# Patient Record
Sex: Male | Born: 1945
Health system: Southern US, Community
[De-identification: ages and names within clinical notes are randomized; demographics above are authoritative.]

## PROBLEM LIST (undated history)

## (undated) DIAGNOSIS — D696 Thrombocytopenia, unspecified: Principal | ICD-10-CM

## (undated) DIAGNOSIS — I10 Essential (primary) hypertension: Secondary | ICD-10-CM

## (undated) DIAGNOSIS — E119 Type 2 diabetes mellitus without complications: Secondary | ICD-10-CM

## (undated) HISTORY — DX: Thrombocytopenia, unspecified: D69.6

## (undated) HISTORY — PX: CHOLECYSTECTOMY: SHX55

## (undated) HISTORY — PX: HERNIA REPAIR: SHX51

---

## 2010-06-01 ENCOUNTER — Emergency Department (HOSPITAL_BASED_OUTPATIENT_CLINIC_OR_DEPARTMENT_OTHER)
Admission: EM | Admit: 2010-06-01 | Discharge: 2010-06-01 | Payer: Self-pay | Source: Home / Self Care | Admitting: Emergency Medicine

## 2010-06-09 ENCOUNTER — Emergency Department (HOSPITAL_BASED_OUTPATIENT_CLINIC_OR_DEPARTMENT_OTHER)
Admission: EM | Admit: 2010-06-09 | Discharge: 2010-06-09 | Payer: Self-pay | Source: Home / Self Care | Admitting: Emergency Medicine

## 2010-06-10 ENCOUNTER — Ambulatory Visit
Admission: RE | Admit: 2010-06-10 | Discharge: 2010-06-10 | Payer: Self-pay | Source: Home / Self Care | Attending: Family Medicine | Admitting: Family Medicine

## 2010-06-10 DIAGNOSIS — I10 Essential (primary) hypertension: Secondary | ICD-10-CM | POA: Insufficient documentation

## 2010-06-10 DIAGNOSIS — E119 Type 2 diabetes mellitus without complications: Secondary | ICD-10-CM | POA: Insufficient documentation

## 2010-06-10 DIAGNOSIS — M549 Dorsalgia, unspecified: Secondary | ICD-10-CM | POA: Insufficient documentation

## 2010-07-01 NOTE — Assessment & Plan Note (Signed)
Summary: Lumbar radiculopathy   Vital Signs:  Patient profile:   65 year old male Height:      71 inches (180.34 cm) Weight:      185.8 pounds (84.45 kg) BMI:     26.01 Temp:     98.1 degrees F (36.72 degrees C) oral Pulse rate:   87 / minute BP sitting:   154 / 100  (right arm)  Vitals Entered By: Baxter Hire) (June 10, 2010 2:40 PM) CC: left hip pain Pain Assessment Patient in pain? yes     Location: left hip Intensity: 10 Onset of pain  Left hip pain since New Years Day Nutritional Status BMI of 25 - 29 = overweight  Does patient need assistance? Functional Status Self care Ambulation Normal Comments Pain will radiate to foot. Pain is worse when walking. Came to ED and was back on 06-09-10. PCP gave Rx and patient stated that it did not help.   CC:  left hip pain.  History of Present Illness: 65 yo M here with left lateral leg pain.  Patient reports no known injury Around 05/30/10 started to have pain left buttock and left hip radiating down entire left leg into foot Associated with some numbness but mostly pain Has a dull ache in left leg as well No bowel/bladder dysfunction Went to ED - hip x-rays with minimal DJD but lumbar area showed multilevel DJD No groin pain Has tried ibuprofen ED gave him several prescriptions (has gone there twice since pain started) but has not filled them. No prior back pain or surgeries  Habits & Providers  Alcohol-Tobacco-Diet     Tobacco Status: never  Allergies (verified): No Known Drug Allergies  Family History: + DM and HTN negative for heart disease  Social History: nonsmoker, nondrinker marriedSmoking Status:  never  Physical Exam  General:  Well-developed,well-nourished,in no acute distress; alert,appropriate and cooperative throughout examination Msk:  Back: No gross deformity, swelling, bruising. Mild TTP left paraspinal and midline lumbar region.  TTP within left buttock.  No TTP over  trochanter. FROM without reproduction of pain Strength 4/5 with hip flexion on L, 5/5 on R.  5/5 all other muscle groups BLEs. 2+ MSRs in patellar and achilles tendons and equal bilaterally. Negative SLRs - feels stretch only Sensation intact to light touch bilaterally. Negative logroll bilateral hips Negative Fabers   Impression & Recommendations:  Problem # 1:  BACK PAIN (ICD-724.5) Assessment New Distribution of pain along with DJD on x-rays and weakness hip flexion consistent with lumbar radiculopathy.  Will try to treat conservatively at first.  Prednisone dose pack (advised this will increase his sugars so to continue to monitor closely) then switch to meloxicam - take these medicines with food.  Ok to fill Norco provided from ED but no driving on this.  Heating pad.  Went over other scripts from ED and advised not to fill these.  If not improving after a week will order MRI of lumbar spine and consider ESIs.  Otherwise f/u in 4 weeks if continues to improve.  His updated medication list for this problem includes:    Meloxicam 15 Mg Tabs (Meloxicam) .Marland Kitchen... 1 tab by mouth daily with food - start after finishing prednisone  Complete Medication List: 1)  Prednisone (pak) 10 Mg Tabs (Prednisone) .... Take as directed x 6 days 2)  Meloxicam 15 Mg Tabs (Meloxicam) .Marland Kitchen.. 1 tab by mouth daily with food - start after finishing prednisone 3)  Glipizide-metformin Hcl 5-500 Mg Tabs (Glipizide-metformin  hcl) 4)  Atenolol-chlorthalidone 50-25 Mg Tabs (Atenolol-chlorthalidone) 5)  Cozaar 25 Mg Tabs (Losartan potassium)  Patient Instructions: 1)  You have lumbar radiculopathy - a pinched nerve in your back causing pain throughout your left leg. 2)  Start prednisone as directed for 6 days with food. 3)  AFTER you are done with this, start meloxicam 15mg  daily with food. 4)  Use heating pad to help with pain/muscle spasms. 5)  Do NOT take the naprosyn given from the ED. 6)  You can fill the Norco  prescription (this is a pain medication). 7)  Call me in a week to let me know if things are improved.  If they have not improved enough, we will order an MRI. 8)  Otherwise follow up with me in 4 weeks for a recheck. Prescriptions: MELOXICAM 15 MG TABS (MELOXICAM) 1 tab by mouth daily with food - start AFTER finishing prednisone  #30 x 1   Entered and Authorized by:   Norton Blizzard MD   Signed by:   Norton Blizzard MD on 06/10/2010   Method used:   Print then Give to Patient   RxID:   [id number] PREDNISONE (PAK) 10 MG TABS (PREDNISONE) Take as directed x 6 days  #21 x 0   Entered and Authorized by:   Norton Blizzard MD   Signed by:   Norton Blizzard MD on 06/10/2010   Method used:   Print then Give to Patient   RxID:   [id number]    Orders Added: 1)  New Patient Level III [99203]

## 2010-07-12 ENCOUNTER — Ambulatory Visit (INDEPENDENT_AMBULATORY_CARE_PROVIDER_SITE_OTHER): Payer: MEDICARE | Admitting: Family Medicine

## 2010-07-12 ENCOUNTER — Encounter: Payer: Self-pay | Admitting: Family Medicine

## 2010-07-12 DIAGNOSIS — M549 Dorsalgia, unspecified: Secondary | ICD-10-CM

## 2010-07-21 NOTE — Assessment & Plan Note (Signed)
Summary: FU/LP/MJD   Vital Signs:  Patient profile:   65 year old male Height:      71 inches (180.34 cm) Weight:      190.8 pounds (86.73 kg) BMI:     26.71 Temp:     98.4 degrees F (36.89 degrees C) oral Pulse rate:   86 / minute BP sitting:   131 / 87  (right arm)  Vitals Entered By: Baxter Hire) (July 12, 2010 3:04 PM) CC: follow-up visit Pain Assessment Patient in pain? yes     Location: left hip Intensity: 5 Nutritional Status BMI of 25 - 29 = overweight  Does patient need assistance? Functional Status Self care Ambulation Normal   CC:  follow-up visit.  History of Present Illness: 65 yo M here for f/u left lateral leg pain, dx lumbar radiculopathy.  Patient initially seen 1 month ago for this issue. He reported no known injury Around 05/30/10 started to have pain left buttock and left hip radiating down entire left leg into foot Associated with some numbness but mostly pain Had a dull ache in left leg as well No bowel/bladder dysfunction Went to ED - hip x-rays with minimal DJD but lumbar area showed multilevel DJD No groin pain Tried ibuprofen No prior back pain or surgeries At [facility] here was placed on prednisone dose pack which he states helped him some - pain now a 5/10 down from 10/10.  Still trouble with prolonged standing. No longer has symptoms past knee No more numbness/tingling. No pain lying on left side over trochanter. Taking mobic still, using heat.  Habits & Providers  Alcohol-Tobacco-Diet     Tobacco Status: never  Problems Prior to Update: 1)  Back Pain  (ICD-724.5) 2)  Essential Hypertension, Benign  (ICD-401.1) 3)  Diabetes Mellitus  (ICD-250.00)  Medications Prior to Update: 1)  Prednisone (Pak) 10 Mg Tabs (Prednisone) .... Take As Directed X 6 Days 2)  Meloxicam 15 Mg Tabs (Meloxicam) .Marland Kitchen.. 1 Tab By Mouth Daily With Food - Start After Finishing Prednisone 3)  Glipizide-Metformin Hcl 5-500 Mg Tabs (Glipizide-Metformin  Hcl) 4)  Atenolol-Chlorthalidone 50-25 Mg Tabs (Atenolol-Chlorthalidone) 5)  Cozaar 25 Mg Tabs (Losartan Potassium)  Allergies (verified): No Known Drug Allergies  Family History: Reviewed history from 06/10/2010 and no changes required. + DM and HTN negative for heart disease  Social History: Reviewed history from 06/10/2010 and no changes required. nonsmoker, nondrinker married  Physical Exam  General:  Well-developed,well-nourished,in no acute distress; alert,appropriate and cooperative throughout examination Msk:  Back: No gross deformity, swelling, bruising. No TTP midline or paraspinal regions - improved. No TTP in left buttock, greater trochanter.  Minimal TTP mid-lat thigh. FROM without reproduction of pain Strength 5/5 hip flexion, ext rotation.  4/5 with knee extension. 5/5 other groups BLEs. 1+ MSR in L patellar tendon, 2+ R patellar tendon, 2+ bilateral achilles. Negative SLRs - feels stretch only Sensation intact to light touch bilaterally. Negative logroll bilateral hips   Impression & Recommendations:  Problem # 1:  BACK PAIN (ICD-724.5) Assessment Improved His pain has improved at least 50% with prednisone dose pack - now intermittent pain and worst when standing for prolonged periods.  Will enroll in PT, continue meloxicam.  Hopefully with PT his pain will improve the rest of the way and we will not have to pursue MRI, ESIs.  Continue heating pad.  F/u in 4 weeks.  His updated medication list for this problem includes:    Meloxicam 15 Mg Tabs (Meloxicam) .Marland KitchenMarland KitchenMarland KitchenMarland Kitchen  1 tab by mouth daily with food - start after finishing prednisone  Complete Medication List: 1)  Prednisone (pak) 10 Mg Tabs (Prednisone) .... Take as directed x 6 days 2)  Meloxicam 15 Mg Tabs (Meloxicam) .Marland Kitchen.. 1 tab by mouth daily with food - start after finishing prednisone 3)  Glipizide-metformin Hcl 5-500 Mg Tabs (Glipizide-metformin hcl) 4)  Atenolol-chlorthalidone 50-25 Mg Tabs  (Atenolol-chlorthalidone) 5)  Cozaar 25 Mg Tabs (Losartan potassium)  Patient Instructions: 1)  You have lumbar radiculopathy - a pinched nerve in your back causing pain throughout your left leg. 2)  Continue the meloxicam as needed. 3)  Start physical therapy for this and your developing frozen shoulders. 4)  Use heating pad to help with pain/muscle spasms. 5)  If this does not continue to improve we will pursue MRI of your back, injections for your shoulders. 6)  Follow up with me in 4 weeks for a recheck.   Orders Added: 1)  Est. Patient Level III [16109]

## 2011-03-08 ENCOUNTER — Ambulatory Visit: Payer: Medicare Other | Admitting: Hematology & Oncology

## 2011-05-19 ENCOUNTER — Telehealth: Payer: Self-pay | Admitting: *Deleted

## 2011-05-19 NOTE — Telephone Encounter (Signed)
Pt called schedule 06-30-11 appointment. He was referred on 02-16-11 to [place] from PCP. He is aware if there is an urgent need to have PCP fax [place] labs and new referral.

## 2011-06-07 ENCOUNTER — Ambulatory Visit (HOSPITAL_BASED_OUTPATIENT_CLINIC_OR_DEPARTMENT_OTHER)
Admission: RE | Admit: 2011-06-07 | Discharge: 2011-06-07 | Disposition: A | Discharge status: Home / Self Care | Payer: Medicare Other | Source: Ambulatory Visit | Attending: Internal Medicine | Admitting: Internal Medicine

## 2011-06-07 ENCOUNTER — Other Ambulatory Visit (HOSPITAL_BASED_OUTPATIENT_CLINIC_OR_DEPARTMENT_OTHER): Payer: Self-pay | Admitting: Internal Medicine

## 2011-06-07 DIAGNOSIS — M7989 Other specified soft tissue disorders: Secondary | ICD-10-CM | POA: Insufficient documentation

## 2011-06-07 DIAGNOSIS — R609 Edema, unspecified: Secondary | ICD-10-CM

## 2011-06-07 DIAGNOSIS — M79609 Pain in unspecified limb: Secondary | ICD-10-CM | POA: Insufficient documentation

## 2011-06-30 ENCOUNTER — Other Ambulatory Visit (HOSPITAL_BASED_OUTPATIENT_CLINIC_OR_DEPARTMENT_OTHER): Payer: Medicare Other | Admitting: Lab

## 2011-06-30 ENCOUNTER — Ambulatory Visit: Payer: Medicare Other

## 2011-06-30 ENCOUNTER — Ambulatory Visit (HOSPITAL_BASED_OUTPATIENT_CLINIC_OR_DEPARTMENT_OTHER): Payer: Medicare Other | Admitting: Hematology & Oncology

## 2011-06-30 ENCOUNTER — Encounter: Payer: Self-pay | Admitting: Hematology & Oncology

## 2011-06-30 VITALS — BP 157/87 | HR 70 | Temp 97.6°F | Ht 70.5 in | Wt 192.0 lb

## 2011-06-30 DIAGNOSIS — D696 Thrombocytopenia, unspecified: Secondary | ICD-10-CM

## 2011-06-30 HISTORY — DX: Thrombocytopenia, unspecified: D69.6

## 2011-06-30 LAB — CBC WITH DIFFERENTIAL (CANCER CENTER ONLY)
BASO%: 0.3 % (ref 0.0–2.0)
Eosinophils Absolute: 0.2 10*3/uL (ref 0.0–0.5)
LYMPH%: 31.1 % (ref 14.0–48.0)
MCH: 29.6 pg (ref 28.0–33.4)
MCV: 84 fL (ref 82–98)
MONO#: 0.7 10*3/uL (ref 0.1–0.9)
MONO%: 7.1 % (ref 0.0–13.0)
NEUT#: 5.4 10*3/uL (ref 1.5–6.5)
Platelets: 114 10*3/uL — ABNORMAL LOW (ref 145–400)
RBC: 5.41 10*6/uL (ref 4.20–5.70)
RDW: 13.6 % (ref 11.1–15.7)
WBC: 9.2 10*3/uL (ref 4.0–10.0)

## 2011-06-30 LAB — TECHNOLOGIST REVIEW CHCC SATELLITE: Tech Review: NONE SEEN

## 2011-06-30 LAB — CHCC SATELLITE - SMEAR

## 2011-06-30 NOTE — Progress Notes (Signed)
This office note has been dictated.

## 2011-09-16 NOTE — Progress Notes (Signed)
CC:   Blake Herman, M.D.  DIAGNOSIS:  Thrombocytopenia, etiology not clear.  HISTORY OF PRESENT ILLNESS:  Blake Herman is a nice 66 year old African American gentleman.  He is followed by Dr. Julio Sicks.  He has history of hypertension.  He has type 2 diabetes.  Dr. Leanor Kail has noted some thrombocytopenia with Blake Herman.  He has had some lab work done.  Back on 01/14/2011 lab work was done which showed a white cell count of 6.5, hemoglobin 14.1, hematocrit 42 and platelet count was 118.  MCV was 85.  He had a relatively normal white cell differential.  His electrolytes at that time looked okay.  BUN and creatinine were 19 and 1.2.  LFTs were normal.  Blake Herman has not had any problems and is asymptomatic.  He has had no problems with bleeding or bruising.  He has had no weight loss or weight gain.  He is not a vegetarian.  He has had no change in his medications.  Again, Dr. Leanor Kail kindly referred this man to the [facility] for an evaluation of the thrombocytopenia.  Blake Herman has not noted any issues with joint swelling or pain.  He has had no diagnosis of rheumatoid arthritis.  He has had no skin issues. He has had no headache.  There has been no dysphagia or odynophagia.  He has had no nausea or vomiting.  There is no cough.  PAST MEDICAL HISTORY:  Remarkable for: 1. Hypertension. 2. Diabetes mellitus, noninsulin dependent. 3. Erectile dysfunction. 4. Hypertensive heart disease.  ALLERGIES:  None.  MEDICATIONS: 1. Aspirin 81 mg p.o. daily. 2. Losartan 50 mg p.o. daily. 3. Glipizide/metformin 5/500 one p.o. b.i.d. 4. Cialis 5 mg p.o. daily. 5. Atenolol/chlorthalidone 50/25 one p.o. daily. 6. Nortriptyline 25 mg p.o. q.h.s.  SOCIAL HISTORY:  Negative for tobacco use.  There is no history of alcohol use.  He has no obvious occupational exposures.  FAMILY HISTORY:  Noncontributory.  There is no history of any hematologic issue in the  family.  REVIEW OF SYSTEMS:  As stated in history of present illness.  PHYSICAL EXAMINATION:  General:  This is a well-developed, well- nourished black gentleman in no obvious distress.  Vital signs:  Show a temperature of 97.6, pulse 70, respiratory rate 18, blood pressure 157/87.  Weight is 192.  Head and neck:  Exam shows normocephalic, atraumatic skull.  There are no ocular or oral lesions.  There are no palpable cervical or supraclavicular lymph nodes.  Lungs:  Are clear bilaterally.  Cardiac:  Regular rate and rhythm with normal S1, S2. There are no murmurs, rubs or bruits.  Abdomen:  Soft with good bowel sounds.  There is no palpable abdominal mass.  There is no palpable hepatosplenomegaly.  Back:  No tenderness over the spine, ribs or hips. Extremities:  Shows no clubbing, cyanosis or edema.  Skin:  No rash, ecchymosis or petechia.  Neurological:  Exam shows no focal neurological deficits.  LABORATORY STUDIES:  White cell count is 9.2, hemoglobin 16, hematocrit 45.2, platelet count 114.  MCV is 84.  Peripheral smear shows normochromic, normocytic population of red blood cells.  There are no nucleated red blood cells.  There are no teardrop cells.  I see no target cells.  There is no rouleaux formation.  White cells appear normal in morphology and maturation.  There are no hypersegmented polys. I see no atypical lymphocytes.  There are no immature myeloid or lymphoid forms.  There are no blasts.  Platelets are minimally decreased in number.  He has several large platelets.  Platelets are well- granulated.  IMPRESSION:  Blake Herman is a 66 year old black gentleman with thrombocytopenia.  It is hard to say how long this has been going on. Going through the record, his thrombocytopenia was noted back in August of 2012.  By his blood smear, one would think that this might be immune based thrombocytopenia.  His platelets are large.  This would indicate that his bone marrow is  making the platelets but they are being destroyed or consumed in the peripheral circulation.  I cannot find anything on his physical exam that would suggest an underlying issue.  His blood smear certainly looks benign for any hematologic malignancy.  I suppose medications could be the cause.  He is on medications that could cause thrombocytopenia.  Nothing has been changed on his medication schedule, however.  I feel very confident just watching Blake Herman right now.  I think that if his platelet count continues to show a decrease, then we might have to pursue a bone marrow biopsy.  Otherwise, I just feel that we should be conservative with our workup and recommendations.  I spent a good hour or more with Blake Herman.  He is a real nice guy.  We had good fellowship.  I will see Blake Herman back in about 3 months or so for followup.    ______________________________ Josph Macho, M.D. PRE/MEDQ  D:  07/02/2011  T:  07/02/2011  Job:  [id number]

## 2011-09-26 ENCOUNTER — Ambulatory Visit (HOSPITAL_BASED_OUTPATIENT_CLINIC_OR_DEPARTMENT_OTHER): Payer: Medicare Other | Admitting: Hematology & Oncology

## 2011-09-26 ENCOUNTER — Other Ambulatory Visit (HOSPITAL_BASED_OUTPATIENT_CLINIC_OR_DEPARTMENT_OTHER): Payer: Medicare Other | Admitting: Lab

## 2011-09-26 VITALS — BP 157/82 | HR 80 | Temp 98.0°F | Ht 69.0 in | Wt 197.0 lb

## 2011-09-26 DIAGNOSIS — D696 Thrombocytopenia, unspecified: Secondary | ICD-10-CM

## 2011-09-26 LAB — CBC WITH DIFFERENTIAL (CANCER CENTER ONLY)
BASO%: 0.1 % (ref 0.0–2.0)
EOS%: 1.9 % (ref 0.0–7.0)
LYMPH#: 2.2 10*3/uL (ref 0.9–3.3)
MCH: 29.5 pg (ref 28.0–33.4)
MCHC: 34.9 g/dL (ref 32.0–35.9)
MONO%: 6.5 % (ref 0.0–13.0)
NEUT#: 4.4 10*3/uL (ref 1.5–6.5)
Platelets: 116 10*3/uL — ABNORMAL LOW (ref 145–400)

## 2011-09-26 LAB — CHCC SATELLITE - SMEAR

## 2011-09-26 NOTE — Progress Notes (Signed)
This office note has been dictated.

## 2011-09-26 NOTE — Progress Notes (Signed)
CC:   Jackie Plum, M.D.  DIAGNOSIS:  Thrombocytopenia, medication induced versus mild immune thrombocytopenia.  CURRENT THERAPY:  Observation.  INTERIM HISTORY:  Mr. Blake Herman comes in for followup.  This is his second visit to the office.  We did do a workup when we saw him initially.  All his blood work was unremarkable.  His blood looked fantastic under the microscope.  He is due is doing great.  He has had no problem bleeding or bruising. There has been no change in medications.  He has had no fatigue or weakness.  He has had no change in bowel or bladder habits.  He does have BPH so his urine is not as strong with his urine stream.  PHYSICAL EXAMINATION:  General:  This is a well-developed, well- nourished black gentleman in no obvious distress.  Vital signs:  Show a temperature of 98.6, pulse 80, respiratory rate 22, blood pressure 157/82.  Weight is 197.  Head and neck:  Exam shows a normocephalic, atraumatic skull.  There are no ocular or oral lesions.  There are no palpable cervical or supraclavicular lymph nodes.  Lungs:  Clear bilaterally.  Cardiac:  Regular rate and rhythm with normal S1 and S2. There are no murmurs, rubs or bruits.  Abdomen:  Soft with good bowel sounds.  There is no palpable abdominal mass.  There is no fluid wave. There is no palpable hepatosplenomegaly.  Extremities:  Shows no clubbing, cyanosis or edema.  Skin:  Exam shows no rashes, ecchymoses or petechiae.  LABORATORY STUDIES:  White cell count is 7.3, hemoglobin 15.2, hematocrit 43.5, platelet count 116.  Peripheral smear shows normal platelet morphology.  Platelets are well granulated.  There are a few large platelets.  His white cells appear normal with no hypersegmented polys.  I see no immature myeloid cells. There is no atypical lymphocytes.  IMPRESSION:  Blake Herman is a 66 year old gentleman with thrombocytopenia.  This is holding steady.  So far, I have not seen any indication for  any type of bone marrow issue that could be considered a problem for him. I think we can get him back in 6 months' time.  I think that in 6 months, if his platelet count looks okay, then we can probably let him go from the clinic.    ______________________________ Josph Macho, M.D. PRE/MEDQ  D:  09/26/2011  T:  09/26/2011  Job:  [id number]

## 2012-03-26 ENCOUNTER — Telehealth: Payer: Self-pay | Admitting: Hematology & Oncology

## 2012-03-26 ENCOUNTER — Other Ambulatory Visit: Payer: Medicare Other | Admitting: Lab

## 2012-03-26 ENCOUNTER — Ambulatory Visit: Payer: Medicare Other | Admitting: Hematology & Oncology

## 2012-03-26 NOTE — Telephone Encounter (Signed)
Spoke with pt he said he would call back to reschedule missed appointment from today.

## 2012-11-25 ENCOUNTER — Encounter (HOSPITAL_BASED_OUTPATIENT_CLINIC_OR_DEPARTMENT_OTHER): Payer: Self-pay | Admitting: *Deleted

## 2012-11-25 ENCOUNTER — Emergency Department (HOSPITAL_BASED_OUTPATIENT_CLINIC_OR_DEPARTMENT_OTHER)
Admission: EM | Admit: 2012-11-25 | Discharge: 2012-11-25 | Disposition: A | Discharge status: Home / Self Care | Payer: Medicare Other | Attending: Emergency Medicine | Admitting: Emergency Medicine

## 2012-11-25 DIAGNOSIS — I10 Essential (primary) hypertension: Secondary | ICD-10-CM | POA: Insufficient documentation

## 2012-11-25 DIAGNOSIS — Z8639 Personal history of other endocrine, nutritional and metabolic disease: Secondary | ICD-10-CM | POA: Insufficient documentation

## 2012-11-25 DIAGNOSIS — Z7982 Long term (current) use of aspirin: Secondary | ICD-10-CM | POA: Insufficient documentation

## 2012-11-25 DIAGNOSIS — Z79899 Other long term (current) drug therapy: Secondary | ICD-10-CM | POA: Insufficient documentation

## 2012-11-25 DIAGNOSIS — E119 Type 2 diabetes mellitus without complications: Secondary | ICD-10-CM | POA: Insufficient documentation

## 2012-11-25 DIAGNOSIS — M7021 Olecranon bursitis, right elbow: Secondary | ICD-10-CM

## 2012-11-25 DIAGNOSIS — M7020 Olecranon bursitis, unspecified elbow: Secondary | ICD-10-CM | POA: Insufficient documentation

## 2012-11-25 DIAGNOSIS — Z862 Personal history of diseases of the blood and blood-forming organs and certain disorders involving the immune mechanism: Secondary | ICD-10-CM | POA: Insufficient documentation

## 2012-11-25 HISTORY — DX: Essential (primary) hypertension: I10

## 2012-11-25 HISTORY — DX: Type 2 diabetes mellitus without complications: E11.9

## 2012-11-25 MED ORDER — OXYCODONE-ACETAMINOPHEN 5-325 MG PO TABS: 2.0000 | ORAL_TABLET | ORAL | Status: DC | PRN

## 2012-11-25 MED ORDER — CEPHALEXIN 500 MG PO CAPS: ORAL_CAPSULE | ORAL | Status: DC

## 2012-11-25 MED ORDER — PREDNISONE 20 MG PO TABS: ORAL_TABLET | ORAL | Status: DC

## 2012-11-25 NOTE — ED Provider Notes (Signed)
History    CSN: [id number] Arrival date & time 11/25/12  0907  First MD Initiated Contact with Patient 11/25/12 1015     Chief Complaint  Patient presents with  . 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 There is no treatment prior to arrival. Past Medical History  Diagnosis Date  . Thrombocytopenia 06/30/2011  . Hypertension   . Diabetes mellitus without complication    No past surgical history on file. No family history on file. History  Substance Use Topics  . Smoking status: Never Smoker   . Smokeless tobacco: Not on file  . Alcohol Use: No    Review of Systems 10 Systems reviewed and are negative for acute change except as noted in the HPI. Allergies  Review of patient's allergies indicates no known allergies.  Home Medications   Current Outpatient Rx  Name  Route  Sig  Dispense  Refill  . aspirin 81 MG tablet   Oral   Take 81 mg by mouth daily.         Marland Kitchen atenolol-chlorthalidone (TENORETIC) 50-25 MG per tablet   Oral   Take by mouth Daily. 50-25 mg         . cephALEXin (KEFLEX) 500 MG capsule      2 caps po bid x 7 days   28 capsule   0   . glipiZIDE-metformin (METAGLIP) 5-500 MG per tablet   Oral   Take by mouth Twice daily. 5-500 mg         . losartan (COZAAR) 50 MG tablet   Oral   Take 50 mg by mouth Daily.         Marland Kitchen oxyCODONE-acetaminophen (PERCOCET) 5-325 MG per tablet   Oral   Take 2 tablets by mouth every 4 (four) hours as needed for pain.   20  tablet   0   . predniSONE (DELTASONE) 20 MG tablet      3 tabs po day one, then 2 tabs daily x 4 days   11 tablet   0    BP 134/76  Pulse 80  Resp 20  SpO2 100% Physical Exam  Nursing note and vitals reviewed. Constitutional:  Awake, alert, nontoxic appearance.  HENT:  Head: Atraumatic.  Eyes: Right eye exhibits no discharge. Left eye exhibits no discharge.  Neck: Neck supple.  Pulmonary/Chest: Effort normal. He exhibits no tenderness.  Abdominal: Soft. There is no tenderness. There is no rebound.  Musculoskeletal: He exhibits tenderness. He exhibits no edema.  Baseline ROM, no obvious new focal weakness. Left arm and both legs nontender. Right arm nontender shoulder wrist and hand with right hand get a refill less than 2 seconds normal light touch was intact strength in the distributions of the median radial and ulnar nerve function right elbow does not have diffuse tenderness or diffuse erythema or diffuse swelling he is localized erythema mild induration and mild swelling to the olecranon aspect only with only slight decreased range of motion limited by pain I doubt septic arthritis he does have a history of gout but does not  have diffuse joint swelling he only has some mild erythema warmth induration over the olecranon region with limited bedside ultrasound showing no obvious focal fluid collection to suggest abscess  Neurological: He is alert.  Mental status and motor strength appears baseline for patient and situation.  Skin: No rash noted.  Psychiatric: He has a normal mood and affect.    ED Course  Procedures (including critical care time) Presentation consistent with olecranon bursitis potentially a localized cellulitis without abscess potentially gout I doubt septic joint. Patient informed of clinical course, understand medical decision-making process, and agree with plan. Labs Reviewed - No data to display No results found. 1. Olecranon bursitis, right     MDM  I  doubt any other EMC precluding discharge at this time including, but not necessarily limited to the following:septic joint.  Hurman Horn, MD 11/25/12 2155

## 2012-11-25 NOTE — ED Notes (Signed)
Right elbow swollen and painful. Denies recent trauma

## 2014-11-14 ENCOUNTER — Encounter: Payer: Self-pay | Admitting: Podiatry

## 2014-11-14 ENCOUNTER — Ambulatory Visit (INDEPENDENT_AMBULATORY_CARE_PROVIDER_SITE_OTHER): Payer: Medicare HMO | Admitting: Podiatry

## 2014-11-14 VITALS — BP 141/70 | HR 68 | Ht 71.0 in | Wt 183.0 lb

## 2014-11-14 DIAGNOSIS — M109 Gout, unspecified: Secondary | ICD-10-CM

## 2014-11-14 DIAGNOSIS — M25572 Pain in left ankle and joints of left foot: Secondary | ICD-10-CM | POA: Insufficient documentation

## 2014-11-14 DIAGNOSIS — M1009 Idiopathic gout, multiple sites: Secondary | ICD-10-CM | POA: Diagnosis not present

## 2014-11-14 NOTE — Patient Instructions (Signed)
Seen for pain in left great toe joint. Noted of irregular and damaged joint on the first great toe left foot. Possible arthritic joint pain. Cortisone injection given. Return as needed.

## 2014-11-14 NOTE — Progress Notes (Signed)
Subjective: 69 year old male presents stating that he is having pain on left big joint for over a week.  Been having Gout problem off and on for the last 20 years. It flares up at times depending on what he eats.  Has had injection for gout in past.  Blood sugar and blood pressure is doing good.   Review of Systems - Negative other than Diabetes, hypertension and periodic gouty foot pain.  Objective: Dermatologic: No open skin lesions. Dystrophic nails x 10. Vascular: No edema or erythema noted. No increase in temperature on affected first MPJ left foot.  Pedal pulses are faintly palpable bilateral. Multiple discolorated skin with old scar on both lower limbs.  Neurologic: All epicritic and tactile sensations grossly intact. Orthopedic: Enlarged first metatarsal head with limited joint motion left foot.  Radiographic examination reveal irregular joint surface and narrowing of space first MPJ left, spurring of phalangeal shaft left great toe from old injury. No acute changes noted.  Assessment: Arthropathy first MPJ left. 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   Plan: Reviewed findings and available treatment options. First MPJ left foot Injected with mixture of 4 mg Dexamethasone, 4 mg Triamcinolone, and 1 cc of 0.5% Marcaine plain.  Patient tolerated injection well without difficulty.  Return as needed.

## 2015-02-28 DIAGNOSIS — M7021 Olecranon bursitis, right elbow: Secondary | ICD-10-CM | POA: Diagnosis not present

## 2015-03-05 DIAGNOSIS — R69 Illness, unspecified: Secondary | ICD-10-CM | POA: Diagnosis not present

## 2015-03-27 DIAGNOSIS — I119 Hypertensive heart disease without heart failure: Secondary | ICD-10-CM | POA: Diagnosis not present

## 2015-03-27 DIAGNOSIS — Z23 Encounter for immunization: Secondary | ICD-10-CM | POA: Diagnosis not present

## 2015-03-27 DIAGNOSIS — I10 Essential (primary) hypertension: Secondary | ICD-10-CM | POA: Diagnosis not present

## 2015-03-27 DIAGNOSIS — E1151 Type 2 diabetes mellitus with diabetic peripheral angiopathy without gangrene: Secondary | ICD-10-CM | POA: Diagnosis not present

## 2015-04-10 DIAGNOSIS — R69 Illness, unspecified: Secondary | ICD-10-CM | POA: Diagnosis not present

## 2015-05-01 DIAGNOSIS — D696 Thrombocytopenia, unspecified: Secondary | ICD-10-CM | POA: Diagnosis not present

## 2015-05-01 DIAGNOSIS — E559 Vitamin D deficiency, unspecified: Secondary | ICD-10-CM | POA: Diagnosis not present

## 2015-05-01 DIAGNOSIS — E119 Type 2 diabetes mellitus without complications: Secondary | ICD-10-CM | POA: Diagnosis not present

## 2015-05-01 DIAGNOSIS — I119 Hypertensive heart disease without heart failure: Secondary | ICD-10-CM | POA: Diagnosis not present

## 2015-05-01 DIAGNOSIS — R69 Illness, unspecified: Secondary | ICD-10-CM | POA: Diagnosis not present

## 2015-05-01 DIAGNOSIS — M109 Gout, unspecified: Secondary | ICD-10-CM | POA: Diagnosis not present

## 2015-05-01 DIAGNOSIS — I10 Essential (primary) hypertension: Secondary | ICD-10-CM | POA: Diagnosis not present

## 2015-08-07 DIAGNOSIS — Z0100 Encounter for examination of eyes and vision without abnormal findings: Secondary | ICD-10-CM | POA: Diagnosis not present

## 2015-08-07 DIAGNOSIS — I10 Essential (primary) hypertension: Secondary | ICD-10-CM | POA: Diagnosis not present

## 2015-08-07 DIAGNOSIS — D696 Thrombocytopenia, unspecified: Secondary | ICD-10-CM | POA: Diagnosis not present

## 2015-08-07 DIAGNOSIS — Z7689 Persons encountering health services in other specified circumstances: Secondary | ICD-10-CM | POA: Diagnosis not present

## 2015-08-07 DIAGNOSIS — E119 Type 2 diabetes mellitus without complications: Secondary | ICD-10-CM | POA: Diagnosis not present

## 2015-08-07 DIAGNOSIS — Z0000 Encounter for general adult medical examination without abnormal findings: Secondary | ICD-10-CM | POA: Diagnosis not present

## 2015-08-07 DIAGNOSIS — Z01118 Encounter for examination of ears and hearing with other abnormal findings: Secondary | ICD-10-CM | POA: Diagnosis not present

## 2015-08-07 DIAGNOSIS — M109 Gout, unspecified: Secondary | ICD-10-CM | POA: Diagnosis not present

## 2015-08-07 DIAGNOSIS — Z136 Encounter for screening for cardiovascular disorders: Secondary | ICD-10-CM | POA: Diagnosis not present

## 2015-08-07 DIAGNOSIS — I119 Hypertensive heart disease without heart failure: Secondary | ICD-10-CM | POA: Diagnosis not present

## 2015-08-07 DIAGNOSIS — R69 Illness, unspecified: Secondary | ICD-10-CM | POA: Diagnosis not present

## 2015-09-16 DIAGNOSIS — M79609 Pain in unspecified limb: Secondary | ICD-10-CM | POA: Diagnosis not present

## 2015-09-16 DIAGNOSIS — E119 Type 2 diabetes mellitus without complications: Secondary | ICD-10-CM | POA: Diagnosis not present

## 2015-09-16 DIAGNOSIS — R0989 Other specified symptoms and signs involving the circulatory and respiratory systems: Secondary | ICD-10-CM | POA: Diagnosis not present

## 2015-09-17 DIAGNOSIS — I10 Essential (primary) hypertension: Secondary | ICD-10-CM | POA: Diagnosis not present

## 2015-09-17 DIAGNOSIS — E559 Vitamin D deficiency, unspecified: Secondary | ICD-10-CM | POA: Diagnosis not present

## 2015-10-09 DIAGNOSIS — I10 Essential (primary) hypertension: Secondary | ICD-10-CM | POA: Diagnosis not present

## 2015-10-09 DIAGNOSIS — I119 Hypertensive heart disease without heart failure: Secondary | ICD-10-CM | POA: Diagnosis not present

## 2015-10-09 DIAGNOSIS — D696 Thrombocytopenia, unspecified: Secondary | ICD-10-CM | POA: Diagnosis not present

## 2015-10-09 DIAGNOSIS — M109 Gout, unspecified: Secondary | ICD-10-CM | POA: Diagnosis not present

## 2015-10-09 DIAGNOSIS — E559 Vitamin D deficiency, unspecified: Secondary | ICD-10-CM | POA: Diagnosis not present

## 2015-10-09 DIAGNOSIS — R69 Illness, unspecified: Secondary | ICD-10-CM | POA: Diagnosis not present

## 2015-10-09 DIAGNOSIS — E119 Type 2 diabetes mellitus without complications: Secondary | ICD-10-CM | POA: Diagnosis not present

## 2015-10-15 DIAGNOSIS — I10 Essential (primary) hypertension: Secondary | ICD-10-CM | POA: Diagnosis not present

## 2015-10-15 DIAGNOSIS — E559 Vitamin D deficiency, unspecified: Secondary | ICD-10-CM | POA: Diagnosis not present

## 2015-11-05 DIAGNOSIS — I10 Essential (primary) hypertension: Secondary | ICD-10-CM | POA: Diagnosis not present

## 2015-11-05 DIAGNOSIS — E559 Vitamin D deficiency, unspecified: Secondary | ICD-10-CM | POA: Diagnosis not present

## 2015-12-23 DIAGNOSIS — I10 Essential (primary) hypertension: Secondary | ICD-10-CM | POA: Diagnosis not present

## 2015-12-23 DIAGNOSIS — E559 Vitamin D deficiency, unspecified: Secondary | ICD-10-CM | POA: Diagnosis not present

## 2015-12-25 DIAGNOSIS — E119 Type 2 diabetes mellitus without complications: Secondary | ICD-10-CM | POA: Diagnosis not present

## 2015-12-25 DIAGNOSIS — H52223 Regular astigmatism, bilateral: Secondary | ICD-10-CM | POA: Diagnosis not present

## 2016-01-12 DIAGNOSIS — I10 Essential (primary) hypertension: Secondary | ICD-10-CM | POA: Diagnosis not present

## 2016-01-12 DIAGNOSIS — E559 Vitamin D deficiency, unspecified: Secondary | ICD-10-CM | POA: Diagnosis not present

## 2016-01-15 DIAGNOSIS — R69 Illness, unspecified: Secondary | ICD-10-CM | POA: Diagnosis not present

## 2016-02-19 DIAGNOSIS — I10 Essential (primary) hypertension: Secondary | ICD-10-CM | POA: Diagnosis not present

## 2016-02-19 DIAGNOSIS — Z0000 Encounter for general adult medical examination without abnormal findings: Secondary | ICD-10-CM | POA: Diagnosis not present

## 2016-02-19 DIAGNOSIS — E119 Type 2 diabetes mellitus without complications: Secondary | ICD-10-CM | POA: Diagnosis not present

## 2016-02-19 DIAGNOSIS — I119 Hypertensive heart disease without heart failure: Secondary | ICD-10-CM | POA: Diagnosis not present

## 2016-02-19 DIAGNOSIS — R69 Illness, unspecified: Secondary | ICD-10-CM | POA: Diagnosis not present

## 2016-02-19 DIAGNOSIS — E559 Vitamin D deficiency, unspecified: Secondary | ICD-10-CM | POA: Diagnosis not present

## 2016-02-19 DIAGNOSIS — M109 Gout, unspecified: Secondary | ICD-10-CM | POA: Diagnosis not present

## 2016-02-19 DIAGNOSIS — Z1383 Encounter for screening for respiratory disorder NEC: Secondary | ICD-10-CM | POA: Diagnosis not present

## 2016-02-19 DIAGNOSIS — D696 Thrombocytopenia, unspecified: Secondary | ICD-10-CM | POA: Diagnosis not present

## 2016-02-25 DIAGNOSIS — E559 Vitamin D deficiency, unspecified: Secondary | ICD-10-CM | POA: Diagnosis not present

## 2016-02-25 DIAGNOSIS — I10 Essential (primary) hypertension: Secondary | ICD-10-CM | POA: Diagnosis not present

## 2016-03-24 DIAGNOSIS — E559 Vitamin D deficiency, unspecified: Secondary | ICD-10-CM | POA: Diagnosis not present

## 2016-03-24 DIAGNOSIS — I10 Essential (primary) hypertension: Secondary | ICD-10-CM | POA: Diagnosis not present

## 2016-03-25 DIAGNOSIS — D696 Thrombocytopenia, unspecified: Secondary | ICD-10-CM | POA: Diagnosis not present

## 2016-03-25 DIAGNOSIS — I119 Hypertensive heart disease without heart failure: Secondary | ICD-10-CM | POA: Diagnosis not present

## 2016-03-25 DIAGNOSIS — I10 Essential (primary) hypertension: Secondary | ICD-10-CM | POA: Diagnosis not present

## 2016-03-25 DIAGNOSIS — E559 Vitamin D deficiency, unspecified: Secondary | ICD-10-CM | POA: Diagnosis not present

## 2016-03-25 DIAGNOSIS — Z23 Encounter for immunization: Secondary | ICD-10-CM | POA: Diagnosis not present

## 2016-03-25 DIAGNOSIS — R69 Illness, unspecified: Secondary | ICD-10-CM | POA: Diagnosis not present

## 2016-03-25 DIAGNOSIS — M109 Gout, unspecified: Secondary | ICD-10-CM | POA: Diagnosis not present

## 2016-03-25 DIAGNOSIS — E119 Type 2 diabetes mellitus without complications: Secondary | ICD-10-CM | POA: Diagnosis not present

## 2016-04-22 DIAGNOSIS — I10 Essential (primary) hypertension: Secondary | ICD-10-CM | POA: Diagnosis not present

## 2016-04-22 DIAGNOSIS — E559 Vitamin D deficiency, unspecified: Secondary | ICD-10-CM | POA: Diagnosis not present

## 2016-05-06 DIAGNOSIS — I10 Essential (primary) hypertension: Secondary | ICD-10-CM | POA: Diagnosis not present

## 2016-05-06 DIAGNOSIS — I119 Hypertensive heart disease without heart failure: Secondary | ICD-10-CM | POA: Diagnosis not present

## 2016-05-26 DIAGNOSIS — E559 Vitamin D deficiency, unspecified: Secondary | ICD-10-CM | POA: Diagnosis not present

## 2016-05-26 DIAGNOSIS — I10 Essential (primary) hypertension: Secondary | ICD-10-CM | POA: Diagnosis not present

## 2016-05-27 ENCOUNTER — Emergency Department (HOSPITAL_BASED_OUTPATIENT_CLINIC_OR_DEPARTMENT_OTHER): Payer: Medicare HMO

## 2016-05-27 ENCOUNTER — Inpatient Hospital Stay (HOSPITAL_BASED_OUTPATIENT_CLINIC_OR_DEPARTMENT_OTHER)
Admission: EM | Admit: 2016-05-27 | Discharge: 2016-05-29 | DRG: 054 | Disposition: A | Discharge status: Home / Self Care | Payer: Medicare HMO | Attending: Internal Medicine | Admitting: Internal Medicine

## 2016-05-27 ENCOUNTER — Encounter (HOSPITAL_BASED_OUTPATIENT_CLINIC_OR_DEPARTMENT_OTHER): Payer: Self-pay | Admitting: *Deleted

## 2016-05-27 DIAGNOSIS — Z8249 Family history of ischemic heart disease and other diseases of the circulatory system: Secondary | ICD-10-CM

## 2016-05-27 DIAGNOSIS — E119 Type 2 diabetes mellitus without complications: Secondary | ICD-10-CM

## 2016-05-27 DIAGNOSIS — H9190 Unspecified hearing loss, unspecified ear: Secondary | ICD-10-CM | POA: Diagnosis present

## 2016-05-27 DIAGNOSIS — D333 Benign neoplasm of cranial nerves: Secondary | ICD-10-CM | POA: Diagnosis not present

## 2016-05-27 DIAGNOSIS — Z7984 Long term (current) use of oral hypoglycemic drugs: Secondary | ICD-10-CM

## 2016-05-27 DIAGNOSIS — R509 Fever, unspecified: Secondary | ICD-10-CM

## 2016-05-27 DIAGNOSIS — I10 Essential (primary) hypertension: Secondary | ICD-10-CM | POA: Diagnosis present

## 2016-05-27 DIAGNOSIS — D696 Thrombocytopenia, unspecified: Secondary | ICD-10-CM | POA: Diagnosis present

## 2016-05-27 DIAGNOSIS — I129 Hypertensive chronic kidney disease with stage 1 through stage 4 chronic kidney disease, or unspecified chronic kidney disease: Secondary | ICD-10-CM | POA: Diagnosis present

## 2016-05-27 DIAGNOSIS — Z833 Family history of diabetes mellitus: Secondary | ICD-10-CM

## 2016-05-27 DIAGNOSIS — R05 Cough: Secondary | ICD-10-CM | POA: Diagnosis not present

## 2016-05-27 DIAGNOSIS — R2990 Unspecified symptoms and signs involving the nervous system: Secondary | ICD-10-CM | POA: Diagnosis not present

## 2016-05-27 DIAGNOSIS — E1122 Type 2 diabetes mellitus with diabetic chronic kidney disease: Secondary | ICD-10-CM | POA: Diagnosis present

## 2016-05-27 DIAGNOSIS — R531 Weakness: Secondary | ICD-10-CM | POA: Diagnosis present

## 2016-05-27 DIAGNOSIS — R29810 Facial weakness: Secondary | ICD-10-CM | POA: Diagnosis not present

## 2016-05-27 DIAGNOSIS — G935 Compression of brain: Secondary | ICD-10-CM | POA: Diagnosis present

## 2016-05-27 DIAGNOSIS — R29898 Other symptoms and signs involving the musculoskeletal system: Secondary | ICD-10-CM

## 2016-05-27 DIAGNOSIS — N183 Chronic kidney disease, stage 3 (moderate): Secondary | ICD-10-CM | POA: Diagnosis present

## 2016-05-27 DIAGNOSIS — R059 Cough, unspecified: Secondary | ICD-10-CM

## 2016-05-27 DIAGNOSIS — Z7982 Long term (current) use of aspirin: Secondary | ICD-10-CM

## 2016-05-27 DIAGNOSIS — R200 Anesthesia of skin: Secondary | ICD-10-CM | POA: Diagnosis present

## 2016-05-27 DIAGNOSIS — J101 Influenza due to other identified influenza virus with other respiratory manifestations: Secondary | ICD-10-CM | POA: Diagnosis present

## 2016-05-27 LAB — RAPID URINE DRUG SCREEN, HOSP PERFORMED
Amphetamines: NOT DETECTED
Barbiturates: NOT DETECTED
Benzodiazepines: NOT DETECTED
COCAINE: NOT DETECTED
OPIATES: NOT DETECTED
TETRAHYDROCANNABINOL: NOT DETECTED

## 2016-05-27 LAB — CBC
HCT: 40.7 % (ref 39.0–52.0)
Hemoglobin: 13.7 g/dL (ref 13.0–17.0)
MCH: 29.1 pg (ref 26.0–34.0)
MCHC: 33.7 g/dL (ref 30.0–36.0)
MCV: 86.4 fL (ref 78.0–100.0)
PLATELETS: 101 10*3/uL — AB (ref 150–400)
RBC: 4.71 MIL/uL (ref 4.22–5.81)
RDW: 13.6 % (ref 11.5–15.5)
WBC: 7.1 10*3/uL (ref 4.0–10.5)

## 2016-05-27 LAB — COMPREHENSIVE METABOLIC PANEL
ALT: 16 U/L — AB (ref 17–63)
AST: 27 U/L (ref 15–41)
Albumin: 3.9 g/dL (ref 3.5–5.0)
Alkaline Phosphatase: 50 U/L (ref 38–126)
Anion gap: 12 (ref 5–15)
BUN: 22 mg/dL — ABNORMAL HIGH (ref 6–20)
CHLORIDE: 99 mmol/L — AB (ref 101–111)
CO2: 24 mmol/L (ref 22–32)
CREATININE: 1.39 mg/dL — AB (ref 0.61–1.24)
Calcium: 9.2 mg/dL (ref 8.9–10.3)
GFR, EST AFRICAN AMERICAN: 57 mL/min — AB (ref >60)
GFR, EST NON AFRICAN AMERICAN: 49 mL/min — AB (ref >60)
Glucose, Bld: 239 mg/dL — ABNORMAL HIGH (ref 65–99)
Potassium: 3.4 mmol/L — ABNORMAL LOW (ref 3.5–5.1)
Sodium: 135 mmol/L (ref 135–145)
Total Bilirubin: 0.6 mg/dL (ref 0.3–1.2)
Total Protein: 7 g/dL (ref 6.5–8.1)

## 2016-05-27 LAB — URINALYSIS, ROUTINE W REFLEX MICROSCOPIC
Bilirubin Urine: NEGATIVE
GLUCOSE, UA: NEGATIVE mg/dL
Hgb urine dipstick: NEGATIVE
KETONES UR: NEGATIVE mg/dL
LEUKOCYTES UA: NEGATIVE
Nitrite: NEGATIVE
PH: 6 (ref 5.0–8.0)
Protein, ur: NEGATIVE mg/dL
Specific Gravity, Urine: 1.022 (ref 1.005–1.030)

## 2016-05-27 LAB — DIFFERENTIAL
BASOS PCT: 0 %
Basophils Absolute: 0 10*3/uL (ref 0.0–0.1)
EOS ABS: 0 10*3/uL (ref 0.0–0.7)
Eosinophils Relative: 0 %
LYMPHS ABS: 0.4 10*3/uL — AB (ref 0.7–4.0)
Lymphocytes Relative: 6 %
MONO ABS: 0.4 10*3/uL (ref 0.1–1.0)
Monocytes Relative: 6 %
NEUTROS ABS: 6.3 10*3/uL (ref 1.7–7.7)
Neutrophils Relative %: 88 %

## 2016-05-27 LAB — TROPONIN I: Troponin I: <0.03 ng/mL (ref <0.03)

## 2016-05-27 LAB — PROTIME-INR
INR: 1.1
PROTHROMBIN TIME: 14.2 s (ref 11.4–15.2)

## 2016-05-27 LAB — I-STAT CG4 LACTIC ACID, ED: Lactic Acid, Venous: 2.1 mmol/L (ref 0.5–1.9)

## 2016-05-27 LAB — ETHANOL

## 2016-05-27 LAB — APTT: aPTT: 35 seconds (ref 24–36)

## 2016-05-27 MED ORDER — AZITHROMYCIN 500 MG IV SOLR
500.0000 mg | Freq: Once | INTRAVENOUS | Status: AC
  Administered 2016-05-27: 500 mg via INTRAVENOUS

## 2016-05-27 MED ORDER — AZITHROMYCIN 500 MG IV SOLR
INTRAVENOUS | Status: AC
  Filled 2016-05-27: qty 500

## 2016-05-27 MED ORDER — DEXTROSE 5 % IV SOLN
1.0000 g | Freq: Once | INTRAVENOUS | Status: AC
  Administered 2016-05-27: 1 g via INTRAVENOUS
  Filled 2016-05-27: qty 10

## 2016-05-27 MED ORDER — SODIUM CHLORIDE 0.9 % IV BOLUS (SEPSIS)
1000.0000 mL | Freq: Once | INTRAVENOUS | Status: AC
  Administered 2016-05-27: 1000 mL via INTRAVENOUS

## 2016-05-27 MED ORDER — ACETAMINOPHEN 500 MG PO TABS
1000.0000 mg | ORAL_TABLET | Freq: Once | ORAL | Status: AC
  Administered 2016-05-27: 1000 mg via ORAL
  Filled 2016-05-27: qty 2

## 2016-05-27 NOTE — ED Triage Notes (Signed)
Weakness since last night. Wife states he cant hold a cup with his right hand. Leaning to the left. He was ambulatory to registration with a staggered gait. Right side mouth droop. Moving all extremities but slightly weaker grip on the right.

## 2016-05-27 NOTE — Consult Note (Signed)
Admission H&P    Chief Complaint: weakness with acute infectious process.  HPI: Blake Herman is an 70 y.o. male with a history of diabetes mellitus, hypertension and thrombocytopenia brought to the ED at [facility] for weakness. He was noted to be febrile. There was also concern for possible disproportionate weakness of right extremities compared to left extremities. CT scan of his head showed no acute abnormality. Because of concern for possible acute stroke patient was transfer for further workup, including possible stroke workup. He has no previous history of stroke, nor TIA. He takes aspirin 81 mg per day.  LSN: unclear tPA Given: No: equivocal right side weakness with unclear time of onset mRankin:  Past Medical History:  Diagnosis Date  . Diabetes mellitus without complication ([place])   . Hypertension   . Thrombocytopenia ([place]) 06/30/2011    Past Surgical History:  Procedure Laterality Date  . CHOLECYSTECTOMY    . HERNIA REPAIR      No family history on file. Social History:  reports that he has never smoked. He has never used smokeless tobacco. He reports that he does not drink alcohol. His drug history is not on file.  Allergies: No Known Allergies  Medications Prior to Admission  Medication Sig Dispense Refill  . aspirin 81 MG EC tablet TK 1 T PO QD  1  . atenolol-chlorthalidone (TENORETIC) 50-25 MG per tablet Take 1 tablet by mouth Daily. 50-25 mg    . cholecalciferol (VITAMIN D) 1000 units tablet Take 1,000 Units by mouth daily.    Marland Kitchen losartan (COZAAR) 50 MG tablet Take 50 mg by mouth Daily.    . metFORMIN (GLUCOPHAGE) 500 MG tablet Take 500 mg by mouth 2 (two) times daily.    . colchicine 0.6 MG tablet   0  . etodolac (LODINE) 500 MG tablet TK 1 T PO  BID  0  . folic acid (FOLVITE) 1 MG tablet TK 1 T PO ONCE D  1  . glipiZIDE-metformin (METAGLIP) 5-500 MG per tablet Take by mouth Twice daily. 5-500 mg    . HYDROcodone-ibuprofen (VICOPROFEN) 7.5-200 MG per  tablet TK 1 T PO Q 6 HOURS PRN  0  . oxyCODONE-acetaminophen (PERCOCET) 5-325 MG per tablet Take 2 tablets by mouth every 4 (four) hours as needed for pain. (Patient not taking: Reported on 05/27/2016) 20 tablet 0  . predniSONE (DELTASONE) 20 MG tablet 3 tabs po day one, then 2 tabs daily x 4 days (Patient not taking: Reported on 05/27/2016) 11 tablet 0    ROS: History obtained from the patient  General ROS: acute febrile illness as noted in present illness Psychological ROS: negative for - behavioral disorder, hallucinations, memory difficulties, mood swings or suicidal ideation Ophthalmic ROS: negative for - blurry vision, double vision, eye pain or loss of vision ENT ROS: negative for - epistaxis, nasal discharge, oral lesions, sore throat, tinnitus or vertigo Allergy and Immunology ROS: negative for - hives or itchy/watery eyes Hematological and Lymphatic ROS: negative for - bleeding problems, bruising or swollen lymph nodes Endocrine ROS: negative for - galactorrhea, hair pattern changes, polydipsia/polyuria or temperature intolerance Respiratory ROS: negative for - cough, hemoptysis, shortness of breath or wheezing Cardiovascular ROS: negative for - chest pain, dyspnea on exertion, edema or irregular heartbeat Gastrointestinal ROS: negative for - abdominal pain, diarrhea, hematemesis, nausea/vomiting or stool incontinence Genito-Urinary ROS: negative for - dysuria, hematuria, incontinence or urinary frequency/urgency Musculoskeletal ROS: negative for - joint swelling or muscular weakness Neurological ROS: as noted in  HPI Dermatological ROS: negative for rash and skin lesion changes  Physical Examination: Blood pressure 129/60, pulse 72, temperature 99.4 F (37.4 C), temperature source Oral, resp. rate 20, height 5' 11"  (1.803 m), weight 83.1 kg (183 lb 3.2 oz), SpO2 97 %.  HEENT-  Normocephalic, no lesions, without obvious abnormality.  Normal external eye and conjunctiva.  Normal  TM's bilaterally.  Normal auditory canals and external ears. Normal external nose, mucus membranes and septum.  Normal pharynx. Neck supple with no masses, nodes, nodules or enlargement. Cardiovascular - regular rate and rhythm, S1, S2 normal, no murmur, click, rub or gallop Lungs - chest clear, no wheezing, rales, normal symmetric air entry Abdomen - soft, non-tender; bowel sounds normal; no masses,  no organomegaly Extremities - no joint deformities, effusion, or inflammation and no edema  Neurologic Examination: Mental Status: Alert, oriented, thought content appropriate.  Speech fluent without evidence of aphasia. Able to follow commands without difficulty. Cranial Nerves: II-Visual fields were normal. III/IV/VI-Pupils were equal and reacted normally to light. Extraocular movements were full and conjugate.    V/VII-no facial numbness and no facial weakness. VIII-normal. X-normal speech and symmetrical palatal movement. XI: trapezius strength/neck flexion strength normal bilaterally XII-midline tongue extension with normal strength. Motor: 5/5 bilaterally with normal tone and bulk Sensory: Normal throughout. Deep Tendon Reflexes: 1+ and symmetric. Plantars: mute bilaterally Cerebellar: Normal finger-to-nose testing. Carotid auscultation: Normal  Results for orders placed or performed during the hospital encounter of 05/27/16 (from the past 48 hour(s))  Ethanol     Status: None   Collection Time: 05/27/16  5:40 PM  Result Value Ref Range   Alcohol, Ethyl (B) <5 <5 mg/dL    Comment:        LOWEST DETECTABLE LIMIT FOR SERUM ALCOHOL IS 5 mg/dL FOR MEDICAL PURPOSES ONLY   Protime-INR     Status: None   Collection Time: 05/27/16  5:40 PM  Result Value Ref Range   Prothrombin Time 14.2 11.4 - 15.2 seconds   INR 1.10   APTT     Status: None   Collection Time: 05/27/16  5:40 PM  Result Value Ref Range   aPTT 35 24 - 36 seconds  CBC     Status: Abnormal   Collection Time:  05/27/16  5:40 PM  Result Value Ref Range   WBC 7.1 4.0 - 10.5 K/uL   RBC 4.71 4.22 - 5.81 MIL/uL   Hemoglobin 13.7 13.0 - 17.0 g/dL   HCT 40.7 39.0 - 52.0 %   MCV 86.4 78.0 - 100.0 fL   MCH 29.1 26.0 - 34.0 pg   MCHC 33.7 30.0 - 36.0 g/dL   RDW 13.6 11.5 - 15.5 %   Platelets 101 (L) 150 - 400 K/uL    Comment: SPECIMEN CHECKED FOR CLOTS PLATELET COUNT CONFIRMED BY SMEAR PLATELETS APPEAR ADEQUATE   Differential     Status: Abnormal   Collection Time: 05/27/16  5:40 PM  Result Value Ref Range   Neutrophils Relative % 88 %   Lymphocytes Relative 6 %   Monocytes Relative 6 %   Eosinophils Relative 0 %   Basophils Relative 0 %   Neutro Abs 6.3 1.7 - 7.7 K/uL   Lymphs Abs 0.4 (L) 0.7 - 4.0 K/uL   Monocytes Absolute 0.4 0.1 - 1.0 K/uL   Eosinophils Absolute 0.0 0.0 - 0.7 K/uL   Basophils Absolute 0.0 0.0 - 0.1 K/uL   Smear Review LARGE PLATELETS PRESENT   Comprehensive metabolic panel  Status: Abnormal   Collection Time: 05/27/16  5:40 PM  Result Value Ref Range   Sodium 135 135 - 145 mmol/L   Potassium 3.4 (L) 3.5 - 5.1 mmol/L   Chloride 99 (L) 101 - 111 mmol/L   CO2 24 22 - 32 mmol/L   Glucose, Bld 239 (H) 65 - 99 mg/dL   BUN 22 (H) 6 - 20 mg/dL   Creatinine, Ser 1.39 (H) 0.61 - 1.24 mg/dL   Calcium 9.2 8.9 - 10.3 mg/dL   Total Protein 7.0 6.5 - 8.1 g/dL   Albumin 3.9 3.5 - 5.0 g/dL   AST 27 15 - 41 U/L   ALT 16 (L) 17 - 63 U/L   Alkaline Phosphatase 50 38 - 126 U/L   Total Bilirubin 0.6 0.3 - 1.2 mg/dL   GFR calc non Af Amer 49 (L) >60 mL/min   GFR calc Af Amer 57 (L) >60 mL/min    Comment: (NOTE) The eGFR has been calculated using the CKD EPI equation. This calculation has not been validated in all clinical situations. eGFR's persistently <60 mL/min signify possible Chronic Kidney Disease.    Anion gap 12 5 - 15  Troponin I     Status: None   Collection Time: 05/27/16  5:40 PM  Result Value Ref Range   Troponin I <0.03 <0.03 ng/mL  I-Stat CG4 Lactic Acid, ED      Status: Abnormal   Collection Time: 05/27/16  5:57 PM  Result Value Ref Range   Lactic Acid, Venous 2.10 (HH) 0.5 - 1.9 mmol/L   Comment NOTIFIED PHYSICIAN   Urine rapid drug screen (hosp performed)not at [facility]     Status: None   Collection Time: 05/27/16  6:15 PM  Result Value Ref Range   Opiates NONE DETECTED NONE DETECTED   Cocaine NONE DETECTED NONE DETECTED   Benzodiazepines NONE DETECTED NONE DETECTED   Amphetamines NONE DETECTED NONE DETECTED   Tetrahydrocannabinol NONE DETECTED NONE DETECTED   Barbiturates NONE DETECTED NONE DETECTED    Comment:        DRUG SCREEN FOR MEDICAL PURPOSES ONLY.  IF CONFIRMATION IS NEEDED FOR ANY PURPOSE, NOTIFY LAB WITHIN 5 DAYS.        LOWEST DETECTABLE LIMITS FOR URINE DRUG SCREEN Drug Class       Cutoff (ng/mL) Amphetamine      1000 Barbiturate      200 Benzodiazepine   176 Tricyclics       160 Opiates          300 Cocaine          300 THC              50   Urinalysis, Routine w reflex microscopic     Status: None   Collection Time: 05/27/16  6:15 PM  Result Value Ref Range   Color, Urine YELLOW YELLOW   APPearance CLEAR CLEAR   Specific Gravity, Urine 1.022 1.005 - 1.030   pH 6.0 5.0 - 8.0   Glucose, UA NEGATIVE NEGATIVE mg/dL   Hgb urine dipstick NEGATIVE NEGATIVE   Bilirubin Urine NEGATIVE NEGATIVE   Ketones, ur NEGATIVE NEGATIVE mg/dL   Protein, ur NEGATIVE NEGATIVE mg/dL   Nitrite NEGATIVE NEGATIVE   Leukocytes, UA NEGATIVE NEGATIVE    Comment: Microscopic not done on urines with negative protein, blood, leukocytes, nitrite, or glucose < 500 mg/dL.   Dg Chest 2 View  Result Date: 05/27/2016 CLINICAL DATA:  Fever and non-productive cough x 4 days. Feels  like his balance has been off. No n/v. htn and diab. Ex smoker. EXAM: CHEST  2 VIEW COMPARISON:  None. FINDINGS: Normal mediastinum and cardiac silhouette. Chronic central bronchitic markings. Normal pulmonary vasculature. No effusion, infiltrate, or pneumothorax.  IMPRESSION: Chronic bronchitic markings.  No acute findings. Electronically Signed   By: Suzy Bouchard M.D.   On: 05/27/2016 18:33   Ct Head Wo Contrast  Result Date: 05/27/2016 CLINICAL DATA:  RIGHT facial droop and RIGHT arm numbness. EXAM: CT HEAD WITHOUT CONTRAST TECHNIQUE: Contiguous axial images were obtained from the base of the skull through the vertex without intravenous contrast. COMPARISON:  None. FINDINGS: Brain: No evidence of acute infarction, hemorrhage, hydrocephalus, extra-axial collection or mass lesion/mass effect. Vascular: No hyperdense vessel or unexpected calcification. Skull: Normal. Negative for fracture or focal lesion. Sinuses/Orbits: No acute finding. Other: None. IMPRESSION: No acute intracranial findings. Electronically Signed   By: Suzy Bouchard M.D.   On: 05/27/2016 18:22    Assessment: 70 y.o. male with a history of diabetes mellitus and hypertension presenting with fever and weakness, and equivocal disproportionate weakness involving the right side. Exam was unremarkable with no focal weakness.There are no clinical signs of acute stroke at this point.  Stroke Risk Factors - diabetes mellitus, hypertension  Plan: 1. MRI of the brain to rule out acute cerebral infarction; stroke workup if acute stroke is demonstrated on MRI. Otherwise no further intervention is indicated  neurologically 2. Continue aspirin 81 mg per day 3. Telemetry monitoring  C.R. Nicole Kindred, MD [facility] [phone]  05/27/2016, 10:33 PM

## 2016-05-27 NOTE — ED Provider Notes (Signed)
[facility] DEPT Provider Note   CSN: [id number] Arrival date & time: 05/27/16 1713     History    Chief Complaint  Patient presents with  . Weakness     HPI Blake Herman is a 70 y.o. male.  70yo M w/ PMH including T2DM, HTN, thrombocytopenia who p/w weakness and cough. Wife states that this afternoon when she got home around 1 PM, the patient was still in bed and she tried to get him out of bed but he was too weak to sit up and get himself out. Because he was still in bed, she wonders if he began having weakness earlier in the day. She noted that he had difficulty holding a cup with his right hand and had some balance problems last night but the patient denies any problems last night. She has noticed that he has been leaning to the left when walking. He denies noticing any extremity weakness and he denies any numbness/tingling. No headache, visual changes, difficulty with speech, or confusion. He has had 3-4 days of dry cough and cold symptoms but was not aware of any fevers. No vomiting or diarrhea area no chest pain, shortness of breath, or abdominal pain. He does note that overnight last night he had frequent urination but was only able to void small amounts. He is compliant with his medications. No history of TIA or stroke and no cardiac history.  Past Medical History:  Diagnosis Date  . Diabetes mellitus without complication ([place])   . Hypertension   . Thrombocytopenia ([place]) 06/30/2011     Patient Active Problem List   Diagnosis Date Noted  . Facial droop 05/27/2016  . Pain in joint of left foot 11/14/2014  . Arthritis due to gout 11/14/2014  . Thrombocytopenia ([place]) 06/30/2011  . DIABETES MELLITUS 06/10/2010  . ESSENTIAL HYPERTENSION, BENIGN 06/10/2010  . BACK PAIN 06/10/2010    Past Surgical History:  Procedure Laterality Date  . CHOLECYSTECTOMY    . HERNIA REPAIR          Home Medications    Prior to Admission medications   Medication Sig Start Date  End Date Taking? Authorizing Provider  aspirin 81 MG EC tablet TK 1 T PO QD 10/10/14   Historical Provider, MD  atenolol-chlorthalidone (TENORETIC) 50-25 MG per tablet Take by mouth Daily. 50-25 mg 06/25/11   Historical Provider, MD  cephALEXin (KEFLEX) 500 MG capsule 2 caps po bid x 7 days Patient not taking: Reported on 11/14/2014 11/25/12   Riki Altes, MD  colchicine 0.6 MG tablet  11/07/14   Historical Provider, MD  etodolac (LODINE) 500 MG tablet TK 1 T PO  BID 08/14/14   Historical Provider, MD  folic acid (FOLVITE) 1 MG tablet TK 1 T PO ONCE D 10/30/14   Historical Provider, MD  glipiZIDE-metformin (METAGLIP) 5-500 MG per tablet Take by mouth Twice daily. 5-500 mg 06/25/11   Historical Provider, MD  HYDROcodone-ibuprofen (VICOPROFEN) 7.5-200 MG per tablet TK 1 T PO Q 6 HOURS PRN 10/31/14   Historical Provider, MD  losartan (COZAAR) 50 MG tablet Take 50 mg by mouth Daily. 06/22/11   Historical Provider, MD  ONE TOUCH ULTRA TEST test strip  11/04/14   Historical Provider, MD  Jonetta Speak LANCETS [id number] [place]  10/24/14   Historical Provider, MD  oxyCODONE-acetaminophen (PERCOCET) 5-325 MG per tablet Take 2 tablets by mouth every 4 (four) hours as needed for pain. Patient not taking: Reported on 11/14/2014 11/25/12   Riki Altes, MD  predniSONE Donley Redder)  20 MG tablet 3 tabs po day one, then 2 tabs daily x 4 days 11/25/12   Riki Altes, MD      No family history on file.   Social History  Substance Use Topics  . Smoking status: Never Smoker  . Smokeless tobacco: Never Used  . Alcohol use No     Allergies     Patient has no known allergies.    Review of Systems  10 Systems reviewed and are negative for acute change except as noted in the HPI.   Physical Exam Updated Vital Signs BP (!) 114/54 (BP Location: Right Arm)   Pulse 89   Temp 101.2 F (38.4 C) (Oral)   Resp 14   Ht 5\' 11"  (1.803 m)   Wt 180 lb (81.6 kg)   SpO2 92%   BMI 25.10 kg/m   Physical Exam  Constitutional: He  is oriented to person, place, and time. He appears well-developed and well-nourished. No distress.  Awake, alert  HENT:  Head: Normocephalic and atraumatic.  Eyes: Conjunctivae and EOM are normal. Pupils are equal, round, and reactive to light.  Neck: Neck supple.  Cardiovascular: Normal rate, regular rhythm and normal heart sounds.   No murmur heard. Pulmonary/Chest: Effort normal and breath sounds normal. No respiratory distress.  Abdominal: Soft. Bowel sounds are normal. He exhibits no distension. There is no tenderness.  Musculoskeletal: He exhibits no edema.  Neurological: He is alert and oriented to person, place, and time. He has normal reflexes. He exhibits normal muscle tone.  Fluent speech, mild facial droop at R mouth but otherwise intact CN II-XII normal finger-to-nose testing, negative pronator drift, no clonus, negative Romberg 5/5 strength LUE, BLE 4/5 grip strength RUE, 5/5 triceps/biceps strength RUE Slow, shuffling gait with leaning to left normal sensation x all 4 extremities  Skin: Skin is warm and dry.  Psychiatric: He has a normal mood and affect. Judgment and thought content normal.  Nursing note and vitals reviewed.     ED Treatments / Results  Labs (all labs ordered are listed, but only abnormal results are displayed) Labs Reviewed  CBC - Abnormal; Notable for the following:       Result Value   Platelets 101 (*)    All other components within normal limits  DIFFERENTIAL - Abnormal; Notable for the following:    Lymphs Abs 0.4 (*)    All other components within normal limits  COMPREHENSIVE METABOLIC PANEL - Abnormal; Notable for the following:    Potassium 3.4 (*)    Chloride 99 (*)    Glucose, Bld 239 (*)    BUN 22 (*)    Creatinine, Ser 1.39 (*)    ALT 16 (*)    GFR calc non Af Amer 49 (*)    GFR calc Af Amer 57 (*)    All other components within normal limits  I-STAT CG4 LACTIC ACID, ED - Abnormal; Notable for the following:    Lactic Acid,  Venous 2.10 (*)    All other components within normal limits  URINE CULTURE  CULTURE, BLOOD (ROUTINE X 2)  CULTURE, BLOOD (ROUTINE X 2)  ETHANOL  PROTIME-INR  APTT  RAPID URINE DRUG SCREEN, HOSP PERFORMED  URINALYSIS, ROUTINE W REFLEX MICROSCOPIC  TROPONIN I     EKG  EKG Interpretation  Date/Time:  Friday May 27 2016 17:39:49 EST Ventricular Rate:  89 PR Interval:    QRS Duration: 71 QT Interval:  333 QTC Calculation: 406 R Axis:   25  Text Interpretation:  Sinus rhythm Abnormal R-wave progression, early transition No previous ECGs available Confirmed by Hughes Wyndham MD, Karris Deangelo [phone]) on 05/27/2016 5:46:02 PM         Radiology Dg Chest 2 View  Result Date: 05/27/2016 CLINICAL DATA:  Fever and non-productive cough x 4 days. Feels like his balance has been off. No n/v. htn and diab. Ex smoker. EXAM: CHEST  2 VIEW COMPARISON:  None. FINDINGS: Normal mediastinum and cardiac silhouette. Chronic central bronchitic markings. Normal pulmonary vasculature. No effusion, infiltrate, or pneumothorax. IMPRESSION: Chronic bronchitic markings.  No acute findings. Electronically Signed   By: Suzy Bouchard M.D.   On: 05/27/2016 18:33   Ct Head Wo Contrast  Result Date: 05/27/2016 CLINICAL DATA:  RIGHT facial droop and RIGHT arm numbness. EXAM: CT HEAD WITHOUT CONTRAST TECHNIQUE: Contiguous axial images were obtained from the base of the skull through the vertex without intravenous contrast. COMPARISON:  None. FINDINGS: Brain: No evidence of acute infarction, hemorrhage, hydrocephalus, extra-axial collection or mass lesion/mass effect. Vascular: No hyperdense vessel or unexpected calcification. Skull: Normal. Negative for fracture or focal lesion. Sinuses/Orbits: No acute finding. Other: None. IMPRESSION: No acute intracranial findings. Electronically Signed   By: Suzy Bouchard M.D.   On: 05/27/2016 18:22    Procedures Procedures (including critical care  time) Procedures  Medications Ordered in ED  Medications  azithromycin (ZITHROMAX) 500 mg in dextrose 5 % 250 mL IVPB (500 mg Intravenous New Bag/Given 05/27/16 2008)  azithromycin (ZITHROMAX) 500 MG injection (  Not Given 05/27/16 2013)  acetaminophen (TYLENOL) tablet 1,000 mg (1,000 mg Oral Given 05/27/16 1833)  sodium chloride 0.9 % bolus 1,000 mL (1,000 mLs Intravenous New Bag/Given 05/27/16 1836)  cefTRIAXone (ROCEPHIN) 1 g in dextrose 5 % 50 mL IVPB (0 g Intravenous Stopped 05/27/16 2014)     Initial Impression / Assessment and Plan / ED Course  I have reviewed the triage vital signs and the nursing notes.  Pertinent labs & imaging results that were available during my care of the patient were reviewed by me and considered in my medical decision making (see chart for details).  Clinical Course    Patient presents with a few days of cold symptoms, urinary frequency, and gait problems with leaning to the left, possible right grip strength weakness noted today. Wife states that she started noticing symptoms around 1 PM which is 5 hours from arrival therefore patient outside of any tPA window. He was awake and alert, oriented and comfortable on exam. Initial vital signs notable for fever of 100.6, the remainder of his vital signs were stable. Normal work of breathing. He did have a subtle right facial droop, mild right grip strength weakness, and leaning to the left while walking. Obtained above lab work including blood and urine cultures as well as chest x-ray to evaluate the patient's fever. Lactate was mildly elevated at 2.1. Gave Tylenol and an IV fluid bolus. Blood and urine cultures sent.   Chest x-ray with no acute findings, however patient is approximately 95% on room air and given fever with mildly positive lactate, will treat for community-acquired pneumonia with ceftriaxone and azithromycin. Regarding his neurologic symptoms, obtained head CT which was negative for acute process.  I am concerned about the possibility of stroke given the patient's findings on exam. Discussed with neurology, Dr. Nicole Kindred, who will evaluate the patient and agrees with admission for stroke workup. Discussed with [facility] hospital[facility], Dr. Eulas Post, appreciate her assistance. She has accepted the patient for admission. Patient will  be transferred to [facility] for further evaluation and treatment. He is well-appearing and breathing comfortably at time of transfer. Final Clinical Impressions(s) / ED Diagnoses   Final diagnoses:  Facial droop  Right hand weakness  Fever, unspecified fever cause  Cough     New Prescriptions   No medications on file       Sharlett Iles, MD 05/27/16 2022

## 2016-05-27 NOTE — Progress Notes (Signed)
Patient accepted to be placed in observation status with telemetry monitoring for evaluation of weakness, decreased grip strength in right hand, and right facial droop.  Head CT negative.  Neurology (Dr. Nicole Kindred) is aware of the patient and will see him in consultation upon arrival.  Anticipating he needs routine TIA/CVA work-up.  In addition, patient found to have fever to 101.2 in the ED and reported 3-4 days of cough and congestion.  Chest xray does not show acute infiltrate.  Lactic acid mildly elevated at 2.1.  Patient did not received weight based volume resuscitation, but he has received 1L NS, empiric Rocephin and azithromycin, and blood and urine cultures have been requested.  They are unable to screen for influenza in [facility].  Please page Dr. Nicole Kindred upon arrival.

## 2016-05-28 ENCOUNTER — Observation Stay (HOSPITAL_COMMUNITY): Payer: Medicare HMO

## 2016-05-28 DIAGNOSIS — R2689 Other abnormalities of gait and mobility: Secondary | ICD-10-CM | POA: Diagnosis not present

## 2016-05-28 DIAGNOSIS — G939 Disorder of brain, unspecified: Secondary | ICD-10-CM | POA: Diagnosis not present

## 2016-05-28 DIAGNOSIS — D696 Thrombocytopenia, unspecified: Secondary | ICD-10-CM | POA: Diagnosis not present

## 2016-05-28 DIAGNOSIS — Z7982 Long term (current) use of aspirin: Secondary | ICD-10-CM | POA: Diagnosis not present

## 2016-05-28 DIAGNOSIS — R2990 Unspecified symptoms and signs involving the nervous system: Secondary | ICD-10-CM | POA: Diagnosis not present

## 2016-05-28 DIAGNOSIS — D333 Benign neoplasm of cranial nerves: Secondary | ICD-10-CM | POA: Diagnosis not present

## 2016-05-28 DIAGNOSIS — E1122 Type 2 diabetes mellitus with diabetic chronic kidney disease: Secondary | ICD-10-CM | POA: Diagnosis not present

## 2016-05-28 DIAGNOSIS — R05 Cough: Secondary | ICD-10-CM | POA: Diagnosis present

## 2016-05-28 DIAGNOSIS — H9190 Unspecified hearing loss, unspecified ear: Secondary | ICD-10-CM | POA: Diagnosis present

## 2016-05-28 DIAGNOSIS — R531 Weakness: Secondary | ICD-10-CM | POA: Diagnosis not present

## 2016-05-28 DIAGNOSIS — R29810 Facial weakness: Secondary | ICD-10-CM | POA: Diagnosis not present

## 2016-05-28 DIAGNOSIS — J101 Influenza due to other identified influenza virus with other respiratory manifestations: Secondary | ICD-10-CM | POA: Diagnosis not present

## 2016-05-28 DIAGNOSIS — R200 Anesthesia of skin: Secondary | ICD-10-CM | POA: Diagnosis present

## 2016-05-28 DIAGNOSIS — I129 Hypertensive chronic kidney disease with stage 1 through stage 4 chronic kidney disease, or unspecified chronic kidney disease: Secondary | ICD-10-CM | POA: Diagnosis not present

## 2016-05-28 DIAGNOSIS — Z7984 Long term (current) use of oral hypoglycemic drugs: Secondary | ICD-10-CM | POA: Diagnosis not present

## 2016-05-28 DIAGNOSIS — G935 Compression of brain: Secondary | ICD-10-CM | POA: Diagnosis not present

## 2016-05-28 DIAGNOSIS — Z8249 Family history of ischemic heart disease and other diseases of the circulatory system: Secondary | ICD-10-CM | POA: Diagnosis not present

## 2016-05-28 DIAGNOSIS — N183 Chronic kidney disease, stage 3 (moderate): Secondary | ICD-10-CM | POA: Diagnosis not present

## 2016-05-28 DIAGNOSIS — R059 Cough, unspecified: Secondary | ICD-10-CM

## 2016-05-28 DIAGNOSIS — Z833 Family history of diabetes mellitus: Secondary | ICD-10-CM | POA: Diagnosis not present

## 2016-05-28 LAB — CBC
HEMATOCRIT: 40.6 % (ref 39.0–52.0)
HEMOGLOBIN: 13.9 g/dL (ref 13.0–17.0)
MCH: 29.1 pg (ref 26.0–34.0)
MCHC: 34.2 g/dL (ref 30.0–36.0)
MCV: 85.1 fL (ref 78.0–100.0)
Platelets: 92 10*3/uL — ABNORMAL LOW (ref 150–400)
RBC: 4.77 MIL/uL (ref 4.22–5.81)
RDW: 13.5 % (ref 11.5–15.5)
WBC: 6.9 10*3/uL (ref 4.0–10.5)

## 2016-05-28 LAB — GLUCOSE, CAPILLARY
GLUCOSE-CAPILLARY: 110 mg/dL — AB (ref 65–99)
GLUCOSE-CAPILLARY: 200 mg/dL — AB (ref 65–99)
Glucose-Capillary: 201 mg/dL — ABNORMAL HIGH (ref 65–99)
Glucose-Capillary: 235 mg/dL — ABNORMAL HIGH (ref 65–99)

## 2016-05-28 LAB — BASIC METABOLIC PANEL
Anion gap: 11 (ref 5–15)
BUN: 20 mg/dL (ref 6–20)
CHLORIDE: 98 mmol/L — AB (ref 101–111)
CO2: 28 mmol/L (ref 22–32)
Calcium: 9 mg/dL (ref 8.9–10.3)
Creatinine, Ser: 1.4 mg/dL — ABNORMAL HIGH (ref 0.61–1.24)
GFR calc Af Amer: 56 mL/min — ABNORMAL LOW (ref >60)
GFR calc non Af Amer: 49 mL/min — ABNORMAL LOW (ref >60)
GLUCOSE: 137 mg/dL — AB (ref 65–99)
POTASSIUM: 4 mmol/L (ref 3.5–5.1)
Sodium: 137 mmol/L (ref 135–145)

## 2016-05-28 LAB — INFLUENZA PANEL BY PCR (TYPE A & B)
INFLAPCR: POSITIVE — AB
Influenza B By PCR: NEGATIVE

## 2016-05-28 LAB — LACTIC ACID, PLASMA
Lactic Acid, Venous: 1.3 mmol/L (ref 0.5–1.9)
Lactic Acid, Venous: 1.4 mmol/L (ref 0.5–1.9)

## 2016-05-28 LAB — PROCALCITONIN: Procalcitonin: 0.11 ng/mL

## 2016-05-28 MED ORDER — DEXTROSE 5 % IV SOLN: 1.0000 g | Freq: Once | INTRAVENOUS | Status: DC

## 2016-05-28 MED ORDER — DEXTROSE 5 % IV SOLN
500.0000 mg | INTRAVENOUS | Status: DC
  Administered 2016-05-28: 500 mg via INTRAVENOUS
  Filled 2016-05-28 (×2): qty 500

## 2016-05-28 MED ORDER — DEXAMETHASONE SODIUM PHOSPHATE 4 MG/ML IJ SOLN
4.0000 mg | Freq: Four times a day (QID) | INTRAMUSCULAR | Status: DC
  Administered 2016-05-28 – 2016-05-29 (×4): 4 mg via INTRAVENOUS
  Filled 2016-05-28 (×4): qty 1

## 2016-05-28 MED ORDER — POTASSIUM CHLORIDE CRYS ER 20 MEQ PO TBCR
40.0000 meq | EXTENDED_RELEASE_TABLET | Freq: Once | ORAL | Status: AC
  Administered 2016-05-28: 40 meq via ORAL
  Filled 2016-05-28: qty 2

## 2016-05-28 MED ORDER — SODIUM CHLORIDE 0.9 % IV SOLN
INTRAVENOUS | Status: DC
  Administered 2016-05-28: 03:00:00 via INTRAVENOUS

## 2016-05-28 MED ORDER — GADOBENATE DIMEGLUMINE 529 MG/ML IV SOLN
20.0000 mL | Freq: Once | INTRAVENOUS | Status: AC | PRN
  Administered 2016-05-28: 20 mL via INTRAVENOUS

## 2016-05-28 MED ORDER — OSELTAMIVIR PHOSPHATE 30 MG PO CAPS
30.0000 mg | ORAL_CAPSULE | Freq: Two times a day (BID) | ORAL | Status: DC
  Administered 2016-05-28 – 2016-05-29 (×3): 30 mg via ORAL
  Filled 2016-05-28 (×3): qty 1

## 2016-05-28 MED ORDER — OSELTAMIVIR PHOSPHATE 75 MG PO CAPS: 75.0000 mg | ORAL_CAPSULE | Freq: Every day | ORAL | Status: DC

## 2016-05-28 MED ORDER — ACETAMINOPHEN 650 MG RE SUPP: 650.0000 mg | RECTAL | Status: DC | PRN

## 2016-05-28 MED ORDER — ACETAMINOPHEN 325 MG PO TABS
650.0000 mg | ORAL_TABLET | ORAL | Status: DC | PRN
  Administered 2016-05-28: 650 mg via ORAL
  Filled 2016-05-28: qty 2

## 2016-05-28 MED ORDER — DEXTROSE 5 % IV SOLN: 500.0000 mg | Freq: Once | INTRAVENOUS | Status: DC

## 2016-05-28 MED ORDER — ATENOLOL 25 MG PO TABS
50.0000 mg | ORAL_TABLET | Freq: Every day | ORAL | Status: DC
  Administered 2016-05-28: 50 mg via ORAL
  Filled 2016-05-28 (×2): qty 2

## 2016-05-28 MED ORDER — ASPIRIN EC 81 MG PO TBEC
81.0000 mg | DELAYED_RELEASE_TABLET | Freq: Every day | ORAL | Status: DC
  Administered 2016-05-28 – 2016-05-29 (×2): 81 mg via ORAL
  Filled 2016-05-28 (×2): qty 1

## 2016-05-28 MED ORDER — GUAIFENESIN ER 600 MG PO TB12
600.0000 mg | ORAL_TABLET | Freq: Two times a day (BID) | ORAL | Status: DC
  Administered 2016-05-28 – 2016-05-29 (×4): 600 mg via ORAL
  Filled 2016-05-28 (×4): qty 1

## 2016-05-28 MED ORDER — INSULIN ASPART 100 UNIT/ML ~~LOC~~ SOLN
0.0000 [IU] | Freq: Every day | SUBCUTANEOUS | Status: DC
  Administered 2016-05-28: 2 [IU] via SUBCUTANEOUS

## 2016-05-28 MED ORDER — DEXTROSE 5 % IV SOLN
1.0000 g | INTRAVENOUS | Status: DC
  Administered 2016-05-28: 1 g via INTRAVENOUS
  Filled 2016-05-28 (×2): qty 10

## 2016-05-28 MED ORDER — ACETAMINOPHEN 160 MG/5ML PO SOLN: 650.0000 mg | ORAL | Status: DC | PRN

## 2016-05-28 MED ORDER — INSULIN ASPART 100 UNIT/ML ~~LOC~~ SOLN
0.0000 [IU] | Freq: Three times a day (TID) | SUBCUTANEOUS | Status: DC
  Administered 2016-05-28: 3 [IU] via SUBCUTANEOUS
  Administered 2016-05-28: 5 [IU] via SUBCUTANEOUS
  Administered 2016-05-29: 3 [IU] via SUBCUTANEOUS

## 2016-05-28 NOTE — Progress Notes (Signed)
SLP Cancellation Note  Patient Details Name: Blake Herman MRN: [id number] DOB: Nov 16, 1945   Cancelled treatment:     Pt is not medically ready. MD request to hold evaluation.      [name], MA, CCC-SLP 05/28/2016 11:02 AM

## 2016-05-28 NOTE — Progress Notes (Signed)
OT Cancellation Note  Patient Details Name: Blake Herman MRN: [id number] DOB: 1945-11-13   Cancelled Treatment:    Reason Eval/Treat Not Completed: Patient not medically ready. Will follow up for OT eval as time allows.  Binnie Kand M.S., OTR/L Pager: [phone]  05/28/2016, 9:25 AM

## 2016-05-28 NOTE — Progress Notes (Signed)
PROGRESS NOTE  Blake Herman [id number] DOB: 08/02/1945 DOA: 05/27/2016 PCP: Benito Mccreedy, MD   LOS: 0 days   Brief Narrative: 70 y.o. gentleman with a history of HTN, DM, and thrombocytopenia who presented to the ED in [facility] for evaluation of gait instability, right facial droop, and decreased grip strength in his right hand, as observed by his wife. Patient tells me today other than to balance he felt the weakness and noticed no facial droop  Assessment & Plan: Principal Problem:   Stroke-like symptoms Active Problems:   Diabetes ([place])   Essential hypertension, benign   Thrombocytopenia (HCC)   Facial droop   Cough   Large vestibular schwannoma - This is most likely responsible for patient's strokelike symptoms, however this is a very slow-growing mass. I'm not sure why he experienced his symptoms rapidly. Appreciate neurosurgical consultation, start on Decadron, Dr. Trenton Gammon will see patient in his office next week and determine surgical planning  URI type symptoms / positive influenza A - Started on ceftriaxone and azithromycin on admission, continue given productive cough - Tested positive for influenza A, start Tamiflu  Chronic thrombocytopenia - Patient seen by oncology as an outpatient, this is stable   Diabetes mellitus - Sliding-scale  Creatinine elevation - 1.4 last night and stable today, this puts him in a stage III category and I do suspect he may have underlying chronic kidney disease given long-standing diabetes  - Overall stable, this can be further followed in outpatient setting    DVT prophylaxis: SCD Code Status: Full code Family Communication: Discussed with about 10 family members at bedside  Disposition Plan: Home 1-2 days based on upper respiratory issues   Consultants:   Neurology   Neurosurgery  Procedures:   None   Antimicrobials:  Ceftriaxone 12/29 >>  Azithromycin 12/20 >>   Subjective: - no chest pain, shortness of  breath, no abdominal pain, nausea or vomiting. Complains of a cough  Objective: Vitals:   05/28/16 0408 05/28/16 0609 05/28/16 0930 05/28/16 1305  BP: (!) 107/39 (!) 121/44 (!) 144/59 (!) 116/50  Pulse: 79 83 78 71  Resp: 18 20 18 18   Temp: (!) 102.9 F (39.4 C) (!) 100.7 F (38.2 C) (!) 101.1 F (38.4 C) 99.5 F (37.5 C)  TempSrc: Oral Oral Oral Oral  SpO2: 93% 98% 99% 98%  Weight:      Height:        Intake/Output Summary (Last 24 hours) at 05/28/16 1357 Last data filed at 05/28/16 1130  Gross per 24 hour  Intake              500 ml  Output              300 ml  Net              200 ml   Filed Weights   05/27/16 1719 05/27/16 2149  Weight: 81.6 kg (180 lb) 83.1 kg (183 lb 3.2 oz)    Examination: Constitutional: NAD Vitals:   05/28/16 0408 05/28/16 0609 05/28/16 0930 05/28/16 1305  BP: (!) 107/39 (!) 121/44 (!) 144/59 (!) 116/50  Pulse: 79 83 78 71  Resp: 18 20 18 18   Temp: (!) 102.9 F (39.4 C) (!) 100.7 F (38.2 C) (!) 101.1 F (38.4 C) 99.5 F (37.5 C)  TempSrc: Oral Oral Oral Oral  SpO2: 93% 98% 99% 98%  Weight:      Height:       Eyes: PERRL, lids and conjunctivae normal Respiratory: clear  to auscultation bilaterally, no wheezing, no crackles. Normal respiratory effort. Cardiovascular: Regular rate and rhythm, no murmurs / rubs / gallops. Abdomen: no tenderness. Bowel sounds positive.  Musculoskeletal: no clubbing / cyanosis. Neurologic: non focal, strength equal. No facial droop appreciated    Data Reviewed: I have personally reviewed following labs and imaging studies  CBC:  Recent Labs Lab 05/27/16 1740 05/28/16 0227  WBC 7.1 6.9  NEUTROABS 6.3  --   HGB 13.7 13.9  HCT 40.7 40.6  MCV 86.4 85.1  PLT 101* 92*   Basic Metabolic Panel:  Recent Labs Lab 05/27/16 1740 05/28/16 0227  NA 135 137  K 3.4* 4.0  CL 99* 98*  CO2 24 28  GLUCOSE 239* 137*  BUN 22* 20  CREATININE 1.39* 1.40*  CALCIUM 9.2 9.0   GFR: Estimated Creatinine  Clearance: 50.8 mL/min (by C-G formula based on SCr of 1.4 mg/dL (H)). Liver Function Tests:  Recent Labs Lab 05/27/16 1740  AST 27  ALT 16*  ALKPHOS 50  BILITOT 0.6  PROT 7.0  ALBUMIN 3.9   Coagulation Profile:  Recent Labs Lab 05/27/16 1740  INR 1.10   Cardiac Enzymes:  Recent Labs Lab 05/27/16 1740  TROPONINI <0.03   CBG:  Recent Labs Lab 05/28/16 0028 05/28/16 1136  GLUCAP 110* 201*   Urine analysis:    Component Value Date/Time   COLORURINE YELLOW 05/27/2016 1815   APPEARANCEUR CLEAR 05/27/2016 1815   LABSPEC 1.022 05/27/2016 1815   PHURINE 6.0 05/27/2016 1815   GLUCOSEU NEGATIVE 05/27/2016 1815   HGBUR NEGATIVE 05/27/2016 1815   BILIRUBINUR NEGATIVE 05/27/2016 1815   KETONESUR NEGATIVE 05/27/2016 1815   PROTEINUR NEGATIVE 05/27/2016 1815   NITRITE NEGATIVE 05/27/2016 1815   LEUKOCYTESUR NEGATIVE 05/27/2016 1815   Sepsis Labs: Invalid input(s): PROCALCITONIN, LACTICIDVEN  Recent Results (from the past 240 hour(s))  Culture, blood (routine x 2)     Status: None (Preliminary result)   Collection Time: 05/27/16  5:40 PM  Result Value Ref Range Status   Specimen Description BLOOD LEFT FOREARM  Final   Special Requests BOTTLES DRAWN AEROBIC AND ANAEROBIC 5CC EACH  Final   Culture   Final    NO GROWTH < 12 HOURS Performed at [facility]    Report Status PENDING  Incomplete  Culture, blood (routine x 2)     Status: None (Preliminary result)   Collection Time: 05/27/16  6:30 PM  Result Value Ref Range Status   Specimen Description BLOOD RIGHT FOREARM  Final   Special Requests BOTTLES DRAWN AEROBIC AND ANAEROBIC 5CC EACH  Final   Culture   Final    NO GROWTH < 12 HOURS Performed at [facility]    Report Status PENDING  Incomplete      Radiology Studies: Dg Chest 2 View  Result Date: 05/27/2016 CLINICAL DATA:  Fever and non-productive cough x 4 days. Feels like his balance has been off. No n/v. htn and diab. Ex smoker. EXAM:  CHEST  2 VIEW COMPARISON:  None. FINDINGS: Normal mediastinum and cardiac silhouette. Chronic central bronchitic markings. Normal pulmonary vasculature. No effusion, infiltrate, or pneumothorax. IMPRESSION: Chronic bronchitic markings.  No acute findings. Electronically Signed   By: Suzy Bouchard M.D.   On: 05/27/2016 18:33   Ct Head Wo Contrast  Addendum Date: 05/28/2016   ADDENDUM REPORT: 05/28/2016 08:20 ADDENDUM: In comparison to MRI 05/28/2016 there is a subtle mass lesion in the RIGHT cerebral pontine angle with effacement of the fourth ventricle. Electronically Signed  By: Suzy Bouchard M.D.   On: 05/28/2016 08:20   Result Date: 05/28/2016 CLINICAL DATA:  RIGHT facial droop and RIGHT arm numbness. EXAM: CT HEAD WITHOUT CONTRAST TECHNIQUE: Contiguous axial images were obtained from the base of the skull through the vertex without intravenous contrast. COMPARISON:  None. FINDINGS: Brain: No evidence of acute infarction, hemorrhage, hydrocephalus, extra-axial collection or mass lesion/mass effect. Vascular: No hyperdense vessel or unexpected calcification. Skull: Normal. Negative for fracture or focal lesion. Sinuses/Orbits: No acute finding. Other: None. IMPRESSION: No acute intracranial findings. Electronically Signed: By: Suzy Bouchard M.D. On: 05/27/2016 18:22   Mr Jeri Cos [id number] Contrast  Addendum Date: 05/28/2016   ADDENDUM REPORT: 05/28/2016 08:52 ADDENDUM: Study discussed by telephone with Dr. Cruzita Lederer On 05/28/2016 At 0830 hours. Electronically Signed   By: Genevie Ann M.D.   On: 05/28/2016 08:52   Result Date: 05/28/2016 CLINICAL DATA:  70 year old male with gait instability, right facial droop and decreased grip strength in his right hand. Thrombocytopenia. Fever of 101 Fahrenheit. Initial encounter. EXAM: MRI HEAD WITHOUT AND WITH CONTRAST TECHNIQUE: Multiplanar, multiecho pulse sequences of the brain and surrounding structures were obtained without and with intravenous contrast.  CONTRAST:  29mL MULTIHANCE GADOBENATE DIMEGLUMINE 529 MG/ML IV SOLN COMPARISON:  [facility] Head CT without contrast FINDINGS: Brain: Large lobulated extra-axial mass at the right cerebellopontine angle. The dominant portion of the mass encompasses 33 x 40 x 38 mm (AP by transverse by CC). This has heterogeneously increased T2 signal with facilitated diffusion. The CP angle portion of the mass has fairly homogeneous postcontrast enhancement (there are several areas of nodular hypoenhancement within the mass). However, there is partially solid but more cystic extension of the mass into the right internal auditory canal (series 7, image 8 and series 16, image 8) subsequently with nonvisualization of the cisternal segments of the right seventh and eighth cranial nerves (series 12, image 30). Preserved T2 signal in the right cochlea and vestibular structures. No cochlea or vestibule enhancement. There are trace areas of mineralization or chronic blood products within the CP angle portion of the mass (series 8, image 7). No dural tail identified. The lesion was isointense to the brainstem on CT. Associated moderate mass effect on the right brainstem and cerebellum. No brainstem or cerebellar edema. No ventriculomegaly. No other intracranial mass or abnormal intracranial enhancement. No restricted diffusion or evidence of acute infarction. No acute intracranial hemorrhage identified. Negative pituitary. Negative cervicomedullary junction. Mild for age nonspecific cerebral white matter T2 and FLAIR hyperintensity. No cortical encephalomalacia. No parenchymal chronic blood products. Vascular: Major intracranial vascular flow voids are preserved. Skull and upper cervical spine: Degenerative changes in the visible cervical spine. Visualized bone marrow signal is within normal limits. Sinuses/Orbits: Trace paranasal sinus mucosal thickening. Negative orbits soft tissues. Mastoids are clear. Other: Normal  stylomastoid foramina. Normal parotid glands. Negative scalp soft tissues. IMPRESSION: 1. Large extra-axial mass at the right cerebellopontine angle measuring up to 4 cm diameter. The cisternal component of the mass is solidly enhancing, however, a predominantly cystic component of the mass tracks into the right IAC all the way to the fundus. 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 Meningioma is felt less likely. 2. Mass effect on the brainstem and cerebellum with no edema. Mild mass effect on the fourth ventricle without ventriculomegaly. 3. No acute infarct or other acute intracranial abnormality. Electronically Signed: By: Genevie Ann M.D. On: 05/28/2016 08:22     Scheduled Meds: .  aspirin EC  81 mg Oral Daily  . atenolol  50 mg Oral Daily  . azithromycin  500 mg Intravenous Q24H  . cefTRIAXone (ROCEPHIN)  IV  1 g Intravenous Q24H  . dexamethasone  4 mg Intravenous Q6H  . guaiFENesin  600 mg Oral BID  . insulin aspart  0-15 Units Subcutaneous TID WC  . insulin aspart  0-5 Units Subcutaneous QHS   Continuous Infusions: . sodium chloride 100 mL/hr at 05/28/16 [id number]    Marzetta Board, MD, PhD [facility] Pager [phone]  If 7PM-7AM, please contact night-coverage [email or web address] Password [email or web address] 05/28/2016, 1:57 PM

## 2016-05-28 NOTE — Progress Notes (Signed)
PT Cancellation Note  Patient Details Name: Blake Herman MRN: [id number] DOB: August 21, 1945   Cancelled Treatment:    Reason Eval/Treat Not Completed: Patient not medically ready. Dr. Cruzita Lederer asked to not get pt up today due to finding on MRI and awaiting neurosurgery. PT to return as able when appropriate.    Natanya Holecek M Zyan Mirkin 05/28/2016, 8:35 AM   Kittie Plater, PT, DPT Pager #: [phone] Office #: [phone]

## 2016-05-28 NOTE — H&P (Signed)
History and Physical    Blake Herman [id number] DOB: Dec 18, 1945 DOA: 05/27/2016  PCP: Benito Mccreedy, MD   Patient coming from: [facility]  Chief Complaint: Stroke-like symptoms, cough  HPI: Blake Herman is a 70 y.o. gentleman with a history of HTN, DM, and thrombocytopenia who presented to the ED in [facility] for evaluation of gait instability, right facial droop, and decreased grip strength in his right hand, as observed by his wife.  No history of slurred speech.  No light-headedness or loss of consciousness.  Symptoms present in the past 24 hours, but he has had cough and congestion all week.  He denies known sick contacts.  No chest pain or shortness of breath.  No nausea, vomiting, or diarrhea.  He spiked a fever to 101.2 in the ED in [facility].  ED Course: Lactic acid level 2.1.  He received 1L NS and empiric IV rocephin and azithromycin.  Chest xray negative for acute process.  Head CT negative for acute intracranial findings.  BUN 22, Creatinine 1.39.  K 3.4.  Platelet 101.  Patient transferred to [facility] for neurology evaluation and management of possible pneumonia.  Review of Systems: As per HPI otherwise 10 point review of systems negative.    Past Medical History:  Diagnosis Date  . Diabetes mellitus without complication ([place])   . Hypertension   . Thrombocytopenia ([place]) 06/30/2011    Past Surgical History:  Procedure Laterality Date  . CHOLECYSTECTOMY    . HERNIA REPAIR       reports that he has never smoked. He has never used smokeless tobacco. He reports that he does not drink alcohol. His drug history is not on file.  He is married.  He has five adult children.  No Known Allergies  FAMILY HISTORY: Mother is deceased; she had HTN, DM, MI. Father is deceased; he died in a car wreck.  Prior to Admission medications   Medication Sig Start Date End Date Taking? Authorizing Provider  aspirin 81 MG EC tablet TK 1 T PO QD 10/10/14  Yes Historical Provider, MD    atenolol-chlorthalidone (TENORETIC) 50-25 MG per tablet Take 1 tablet by mouth Daily. 50-25 mg 06/25/11  Yes Historical Provider, MD  cholecalciferol (VITAMIN D) 1000 units tablet Take 1,000 Units by mouth daily.   Yes Historical Provider, MD  losartan (COZAAR) 50 MG tablet Take 50 mg by mouth Daily. 06/22/11  Yes Historical Provider, MD  metFORMIN (GLUCOPHAGE) 500 MG tablet Take 500 mg by mouth 2 (two) times daily. 05/24/16  Yes Historical Provider, MD    Physical Exam: Vitals:   05/27/16 2000 05/27/16 2149 05/28/16 0000 05/28/16 0124  BP: (!) 108/53 129/60 136/60   Pulse: 82 72 74   Resp: 21 20 18    Temp:  99.4 F (37.4 C) 100.3 F (37.9 C) (!) 100.4 F (38 C)  TempSrc:  Oral Oral Oral  SpO2: 93% 97% 100%   Weight:  83.1 kg (183 lb 3.2 oz)    Height:  5\' 11"  (1.803 m)        Constitutional: NAD, calm, comfortable Vitals:   05/27/16 2000 05/27/16 2149 05/28/16 0000 05/28/16 0124  BP: (!) 108/53 129/60 136/60   Pulse: 82 72 74   Resp: 21 20 18    Temp:  99.4 F (37.4 C) 100.3 F (37.9 C) (!) 100.4 F (38 C)  TempSrc:  Oral Oral Oral  SpO2: 93% 97% 100%   Weight:  83.1 kg (183 lb 3.2 oz)    Height:  5\' 11"  (1.803 m)     Eyes: PERRL, lids and conjunctivae normal ENMT: Mucous membranes are moist. Posterior pharynx clear of any exudate or lesions. Normal dentition.  Neck: normal appearance, supple Respiratory: upper respiratory congestion but lungs are clear.  No wheezing, no crackles. Normal respiratory effort. No accessory muscle use.  Cardiovascular: Normal rate, regular rhythm, no murmurs / rubs / gallops. No extremity edema. 2+ pedal pulses. GI: abdomen is soft and compressible.  No distention.  No tenderness. Bowel sounds are present. Musculoskeletal:  No joint deformity in upper and lower extremities. Good ROM, no contractures. Normal muscle tone.  Skin: no rashes, warm and dry Neurologic: Subtle right facial droop noted; otherwise, CN 2-12 grossly intact. Sensation  intact, Strength symmetric bilaterally, 5/5.  Grip strength now symmetric bilaterally. Psychiatric: Normal judgment and insight. Alert and oriented x 3. Normal mood.     Labs on Admission: I have personally reviewed following labs and imaging studies  CBC:  Recent Labs Lab 05/27/16 1740  WBC 7.1  NEUTROABS 6.3  HGB 13.7  HCT 40.7  MCV 86.4  PLT 99991111*   Basic Metabolic Panel:  Recent Labs Lab 05/27/16 1740  NA 135  K 3.4*  CL 99*  CO2 24  GLUCOSE 239*  BUN 22*  CREATININE 1.39*  CALCIUM 9.2   GFR: Estimated Creatinine Clearance: 51.2 mL/min (by C-G formula based on SCr of 1.39 mg/dL (H)). Liver Function Tests:  Recent Labs Lab 05/27/16 1740  AST 27  ALT 16*  ALKPHOS 50  BILITOT 0.6  PROT 7.0  ALBUMIN 3.9   Coagulation Profile:  Recent Labs Lab 05/27/16 1740  INR 1.10   Cardiac Enzymes:  Recent Labs Lab 05/27/16 1740  TROPONINI <0.03   CBG:  Recent Labs Lab 05/28/16 0028  GLUCAP 110*   Urine analysis:    Component Value Date/Time   COLORURINE YELLOW 05/27/2016 1815   APPEARANCEUR CLEAR 05/27/2016 1815   LABSPEC 1.022 05/27/2016 1815   PHURINE 6.0 05/27/2016 1815   GLUCOSEU NEGATIVE 05/27/2016 1815   HGBUR NEGATIVE 05/27/2016 1815   BILIRUBINUR NEGATIVE 05/27/2016 1815   KETONESUR NEGATIVE 05/27/2016 1815   PROTEINUR NEGATIVE 05/27/2016 1815   NITRITE NEGATIVE 05/27/2016 1815   LEUKOCYTESUR NEGATIVE 05/27/2016 1815   Sepsis Labs:  Lactic acid level 2.1  Radiological Exams on Admission: Dg Chest 2 View  Result Date: 05/27/2016 CLINICAL DATA:  Fever and non-productive cough x 4 days. Feels like his balance has been off. No n/v. htn and diab. Ex smoker. EXAM: CHEST  2 VIEW COMPARISON:  None. FINDINGS: Normal mediastinum and cardiac silhouette. Chronic central bronchitic markings. Normal pulmonary vasculature. No effusion, infiltrate, or pneumothorax. IMPRESSION: Chronic bronchitic markings.  No acute findings. Electronically Signed    By: Suzy Bouchard M.D.   On: 05/27/2016 18:33   Ct Head Wo Contrast  Result Date: 05/27/2016 CLINICAL DATA:  RIGHT facial droop and RIGHT arm numbness. EXAM: CT HEAD WITHOUT CONTRAST TECHNIQUE: Contiguous axial images were obtained from the base of the skull through the vertex without intravenous contrast. COMPARISON:  None. FINDINGS: Brain: No evidence of acute infarction, hemorrhage, hydrocephalus, extra-axial collection or mass lesion/mass effect. Vascular: No hyperdense vessel or unexpected calcification. Skull: Normal. Negative for fracture or focal lesion. Sinuses/Orbits: No acute finding. Other: None. IMPRESSION: No acute intracranial findings. Electronically Signed   By: Suzy Bouchard M.D.   On: 05/27/2016 18:22    EKG: Independently reviewed. NSR.  No acute ST segment changes.  Assessment/Plan Principal Problem:   Stroke-like  symptoms Active Problems:   Diabetes ([place])   Essential hypertension, benign   Thrombocytopenia (HCC)   Facial droop   Cough      Stroke like symptoms --Appreciate neurology assessment --MRI pending --Will need complete stroke eval if positive --Continue baby aspirin daily for now  Cough, fever.  Concern for early pneumonia. --Empiric rocephin and azithromycin --Blood cultures pending --Repeat lactic acid, procalcitonin --Mucinex 600mg  po BID --Hydrate with NS --Consider repeat chest xray after hydration to reassess for evolving infiltrate  Chronic thrombocytopenia --Monitor  DM --Hold oral meds, SSI coverage  AKI --HOLD diuretic, ARB, metformin --Hydrate --Repeat BMP in the AM  HTN --Atenolol   DVT prophylaxis: SCDs Code Status: FULL Family Communication: Patient alone at time of admission. Disposition Plan: To be determined. Consults called: Neurology Admission status: Place in observation with telemetry monitoring   TIME SPENT: 60 minutes   Eber Jones MD [facility] Pager [phone]  If  7PM-7AM, please contact night-coverage [email or web address] Password [email or web address]  05/28/2016, 2:07 AM

## 2016-05-28 NOTE — Progress Notes (Signed)
Subjective: MRI completed, revealing a large extra-axial cerebellopontine mass on the right, compressing the brainstem.  Objective: Current vital signs: BP (!) 116/50 (BP Location: Right Arm)   Pulse 71   Temp 99.5 F (37.5 C) (Oral)   Resp 18   Ht 5' 11" (1.803 m)   Wt 83.1 kg (183 lb 3.2 oz)   SpO2 98%   BMI 25.55 kg/m  Vital signs in last 24 hours: Temp:  [99.4 F (37.4 C)-102.9 F (39.4 C)] 99.5 F (37.5 C) (12/30 1305) Pulse Rate:  [71-103] 71 (12/30 1305) Resp:  [14-21] 18 (12/30 1305) BP: (107-168)/(39-89) 116/50 (12/30 1305) SpO2:  [92 %-100 %] 98 % (12/30 1305) Weight:  [81.6 kg (180 lb)-83.1 kg (183 lb 3.2 oz)] 83.1 kg (183 lb 3.2 oz) (12/29 2149)  Intake/Output from previous day: 12/29 0701 - 12/30 0700 In: 500 [I.V.:500] Out: 200 [Urine:200] Intake/Output this shift: Total I/O In: -  Out: 100 [Urine:100] Nutritional status: Diet heart healthy/carb modified Room service appropriate? Yes; Fluid consistency: Thin  Neurologic Exam: Ment: Intact to questions and commands.  CN: Visual fields intact. EOMI without nystagmus. Face symmetric. Tongue protrudes midline.   Lab Results: Results for orders placed or performed during the hospital encounter of 05/27/16 (from the past 48 hour(s))  Ethanol     Status: None   Collection Time: 05/27/16  5:40 PM  Result Value Ref Range   Alcohol, Ethyl (B) <5 <5 mg/dL    Comment:        LOWEST DETECTABLE LIMIT FOR SERUM ALCOHOL IS 5 mg/dL FOR MEDICAL PURPOSES ONLY   Protime-INR     Status: None   Collection Time: 05/27/16  5:40 PM  Result Value Ref Range   Prothrombin Time 14.2 11.4 - 15.2 seconds   INR 1.10   APTT     Status: None   Collection Time: 05/27/16  5:40 PM  Result Value Ref Range   aPTT 35 24 - 36 seconds  CBC     Status: Abnormal   Collection Time: 05/27/16  5:40 PM  Result Value Ref Range   WBC 7.1 4.0 - 10.5 K/uL   RBC 4.71 4.22 - 5.81 MIL/uL   Hemoglobin 13.7 13.0 - 17.0 g/dL   HCT 40.7 39.0 -  52.0 %   MCV 86.4 78.0 - 100.0 fL   MCH 29.1 26.0 - 34.0 pg   MCHC 33.7 30.0 - 36.0 g/dL   RDW 13.6 11.5 - 15.5 %   Platelets 101 (L) 150 - 400 K/uL    Comment: SPECIMEN CHECKED FOR CLOTS PLATELET COUNT CONFIRMED BY SMEAR PLATELETS APPEAR ADEQUATE   Differential     Status: Abnormal   Collection Time: 05/27/16  5:40 PM  Result Value Ref Range   Neutrophils Relative % 88 %   Lymphocytes Relative 6 %   Monocytes Relative 6 %   Eosinophils Relative 0 %   Basophils Relative 0 %   Neutro Abs 6.3 1.7 - 7.7 K/uL   Lymphs Abs 0.4 (L) 0.7 - 4.0 K/uL   Monocytes Absolute 0.4 0.1 - 1.0 K/uL   Eosinophils Absolute 0.0 0.0 - 0.7 K/uL   Basophils Absolute 0.0 0.0 - 0.1 K/uL   Smear Review LARGE PLATELETS PRESENT   Comprehensive metabolic panel     Status: Abnormal   Collection Time: 05/27/16  5:40 PM  Result Value Ref Range   Sodium 135 135 - 145 mmol/L   Potassium 3.4 (L) 3.5 - 5.1 mmol/L   Chloride 99 (L)  101 - 111 mmol/L   CO2 24 22 - 32 mmol/L   Glucose, Bld 239 (H) 65 - 99 mg/dL   BUN 22 (H) 6 - 20 mg/dL   Creatinine, Ser 1.39 (H) 0.61 - 1.24 mg/dL   Calcium 9.2 8.9 - 10.3 mg/dL   Total Protein 7.0 6.5 - 8.1 g/dL   Albumin 3.9 3.5 - 5.0 g/dL   AST 27 15 - 41 U/L   ALT 16 (L) 17 - 63 U/L   Alkaline Phosphatase 50 38 - 126 U/L   Total Bilirubin 0.6 0.3 - 1.2 mg/dL   GFR calc non Af Amer 49 (L) >60 mL/min   GFR calc Af Amer 57 (L) >60 mL/min    Comment: (NOTE) The eGFR has been calculated using the CKD EPI equation. This calculation has not been validated in all clinical situations. eGFR's persistently <60 mL/min signify possible Chronic Kidney Disease.    Anion gap 12 5 - 15  Culture, blood (routine x 2)     Status: None (Preliminary result)   Collection Time: 05/27/16  5:40 PM  Result Value Ref Range   Specimen Description BLOOD LEFT FOREARM    Special Requests BOTTLES DRAWN AEROBIC AND ANAEROBIC 5CC EACH    Culture      NO GROWTH < 12 HOURS Performed at [facility]    Report Status PENDING   Troponin I     Status: None   Collection Time: 05/27/16  5:40 PM  Result Value Ref Range   Troponin I <0.03 <0.03 ng/mL  I-Stat CG4 Lactic Acid, ED     Status: Abnormal   Collection Time: 05/27/16  5:57 PM  Result Value Ref Range   Lactic Acid, Venous 2.10 (HH) 0.5 - 1.9 mmol/L   Comment NOTIFIED PHYSICIAN   Urine rapid drug screen (hosp performed)not at [facility]     Status: None   Collection Time: 05/27/16  6:15 PM  Result Value Ref Range   Opiates NONE DETECTED NONE DETECTED   Cocaine NONE DETECTED NONE DETECTED   Benzodiazepines NONE DETECTED NONE DETECTED   Amphetamines NONE DETECTED NONE DETECTED   Tetrahydrocannabinol NONE DETECTED NONE DETECTED   Barbiturates NONE DETECTED NONE DETECTED    Comment:        DRUG SCREEN FOR MEDICAL PURPOSES ONLY.  IF CONFIRMATION IS NEEDED FOR ANY PURPOSE, NOTIFY LAB WITHIN 5 DAYS.        LOWEST DETECTABLE LIMITS FOR URINE DRUG SCREEN Drug Class       Cutoff (ng/mL) Amphetamine      1000 Barbiturate      200 Benzodiazepine   528 Tricyclics       413 Opiates          300 Cocaine          300 THC              50   Urinalysis, Routine w reflex microscopic     Status: None   Collection Time: 05/27/16  6:15 PM  Result Value Ref Range   Color, Urine YELLOW YELLOW   APPearance CLEAR CLEAR   Specific Gravity, Urine 1.022 1.005 - 1.030   pH 6.0 5.0 - 8.0   Glucose, UA NEGATIVE NEGATIVE mg/dL   Hgb urine dipstick NEGATIVE NEGATIVE   Bilirubin Urine NEGATIVE NEGATIVE   Ketones, ur NEGATIVE NEGATIVE mg/dL   Protein, ur NEGATIVE NEGATIVE mg/dL   Nitrite NEGATIVE NEGATIVE   Leukocytes, UA NEGATIVE NEGATIVE    Comment: Microscopic not  done on urines with negative protein, blood, leukocytes, nitrite, or glucose < 500 mg/dL.  Culture, blood (routine x 2)     Status: None (Preliminary result)   Collection Time: 05/27/16  6:30 PM  Result Value Ref Range   Specimen Description BLOOD RIGHT FOREARM    Special  Requests BOTTLES DRAWN AEROBIC AND ANAEROBIC 5CC EACH    Culture      NO GROWTH < 12 HOURS Performed at [facility]    Report Status PENDING   Glucose, capillary     Status: Abnormal   Collection Time: 05/28/16 12:28 AM  Result Value Ref Range   Glucose-Capillary 110 (H) 65 - 99 mg/dL  Lactic acid, plasma     Status: None   Collection Time: 05/28/16  2:19 AM  Result Value Ref Range   Lactic Acid, Venous 1.3 0.5 - 1.9 mmol/L  Procalcitonin     Status: None   Collection Time: 05/28/16  2:27 AM  Result Value Ref Range   Procalcitonin 0.11 ng/mL    Comment:        Interpretation: PCT (Procalcitonin) <= 0.5 ng/mL: Systemic infection (sepsis) is not likely. Local bacterial infection is possible. (NOTE)         ICU PCT Algorithm               Non ICU PCT Algorithm    ----------------------------     ------------------------------         PCT < 0.25 ng/mL                 PCT < 0.1 ng/mL     Stopping of antibiotics            Stopping of antibiotics       strongly encouraged.               strongly encouraged.    ----------------------------     ------------------------------       PCT level decrease by               PCT < 0.25 ng/mL       >= 80% from peak PCT       OR PCT 0.25 - 0.5 ng/mL          Stopping of antibiotics                                             encouraged.     Stopping of antibiotics           encouraged.    ----------------------------     ------------------------------       PCT level decrease by              PCT >= 0.25 ng/mL       < 80% from peak PCT        AND PCT >= 0.5 ng/mL            Continuin g antibiotics                                              encouraged.       Continuing antibiotics            encouraged.    ----------------------------     ------------------------------  PCT level increase compared          PCT > 0.5 ng/mL         with peak PCT AND          PCT >= 0.5 ng/mL             Escalation of antibiotics                                           strongly encouraged.      Escalation of antibiotics        strongly encouraged.   CBC     Status: Abnormal   Collection Time: 05/28/16  2:27 AM  Result Value Ref Range   WBC 6.9 4.0 - 10.5 K/uL   RBC 4.77 4.22 - 5.81 MIL/uL   Hemoglobin 13.9 13.0 - 17.0 g/dL   HCT 40.6 39.0 - 52.0 %   MCV 85.1 78.0 - 100.0 fL   MCH 29.1 26.0 - 34.0 pg   MCHC 34.2 30.0 - 36.0 g/dL   RDW 13.5 11.5 - 15.5 %   Platelets 92 (L) 150 - 400 K/uL    Comment: REPEATED TO VERIFY SPECIMEN CHECKED FOR CLOTS PLATELET COUNT CONFIRMED BY SMEAR   Basic metabolic panel     Status: Abnormal   Collection Time: 05/28/16  2:27 AM  Result Value Ref Range   Sodium 137 135 - 145 mmol/L   Potassium 4.0 3.5 - 5.1 mmol/L   Chloride 98 (L) 101 - 111 mmol/L   CO2 28 22 - 32 mmol/L   Glucose, Bld 137 (H) 65 - 99 mg/dL   BUN 20 6 - 20 mg/dL   Creatinine, Ser 1.40 (H) 0.61 - 1.24 mg/dL   Calcium 9.0 8.9 - 10.3 mg/dL   GFR calc non Af Amer 49 (L) >60 mL/min   GFR calc Af Amer 56 (L) >60 mL/min    Comment: (NOTE) The eGFR has been calculated using the CKD EPI equation. This calculation has not been validated in all clinical situations. eGFR's persistently <60 mL/min signify possible Chronic Kidney Disease.    Anion gap 11 5 - 15  Lactic acid, plasma     Status: None   Collection Time: 05/28/16  4:41 AM  Result Value Ref Range   Lactic Acid, Venous 1.4 0.5 - 1.9 mmol/L  Influenza panel by PCR (type A & B, H1N1)     Status: Abnormal   Collection Time: 05/28/16  9:46 AM  Result Value Ref Range   Influenza A By PCR POSITIVE (A) NEGATIVE   Influenza B By PCR NEGATIVE NEGATIVE    Comment: (NOTE) The Xpert Xpress Flu assay is intended as an aid in the diagnosis of  influenza and should not be used as a sole basis for treatment.  This  assay is FDA approved for nasopharyngeal swab specimens only. Nasal  washings and aspirates are unacceptable for Xpert Xpress Flu testing.   Glucose, capillary      Status: Abnormal   Collection Time: 05/28/16 11:36 AM  Result Value Ref Range   Glucose-Capillary 201 (H) 65 - 99 mg/dL   Comment 1 Notify RN    Comment 2 Document in Chart     Recent Results (from the past 240 hour(s))  Culture, blood (routine x 2)     Status: None (Preliminary result)   Collection Time: 05/27/16  5:40 PM  Result Value Ref Range Status   Specimen Description BLOOD LEFT FOREARM  Final   Special Requests BOTTLES DRAWN AEROBIC AND ANAEROBIC 5CC EACH  Final   Culture   Final    NO GROWTH < 12 HOURS Performed at [facility]    Report Status PENDING  Incomplete  Culture, blood (routine x 2)     Status: None (Preliminary result)   Collection Time: 05/27/16  6:30 PM  Result Value Ref Range Status   Specimen Description BLOOD RIGHT FOREARM  Final   Special Requests BOTTLES DRAWN AEROBIC AND ANAEROBIC 5CC EACH  Final   Culture   Final    NO GROWTH < 12 HOURS Performed at [facility]    Report Status PENDING  Incomplete    Lipid Panel No results for input(s): CHOL, TRIG, HDL, CHOLHDL, VLDL, LDLCALC in the last 72 hours.  Studies/Results: Dg Chest 2 View  Result Date: 05/27/2016 CLINICAL DATA:  Fever and non-productive cough x 4 days. Feels like his balance has been off. No n/v. htn and diab. Ex smoker. EXAM: CHEST  2 VIEW COMPARISON:  None. FINDINGS: Normal mediastinum and cardiac silhouette. Chronic central bronchitic markings. Normal pulmonary vasculature. No effusion, infiltrate, or pneumothorax. IMPRESSION: Chronic bronchitic markings.  No acute findings. Electronically Signed   By: Suzy Bouchard M.D.   On: 05/27/2016 18:33   Ct Head Wo Contrast  Addendum Date: 05/28/2016   ADDENDUM REPORT: 05/28/2016 08:20 ADDENDUM: In comparison to MRI 05/28/2016 there is a subtle mass lesion in the RIGHT cerebral pontine angle with effacement of the fourth ventricle. Electronically Signed   By: Suzy Bouchard M.D.   On: 05/28/2016 08:20   Result Date:  05/28/2016 CLINICAL DATA:  RIGHT facial droop and RIGHT arm numbness. EXAM: CT HEAD WITHOUT CONTRAST TECHNIQUE: Contiguous axial images were obtained from the base of the skull through the vertex without intravenous contrast. COMPARISON:  None. FINDINGS: Brain: No evidence of acute infarction, hemorrhage, hydrocephalus, extra-axial collection or mass lesion/mass effect. Vascular: No hyperdense vessel or unexpected calcification. Skull: Normal. Negative for fracture or focal lesion. Sinuses/Orbits: No acute finding. Other: None. IMPRESSION: No acute intracranial findings. Electronically Signed: By: Suzy Bouchard M.D. On: 05/27/2016 18:22   Mr Jeri Cos HU Contrast  Addendum Date: 05/28/2016   ADDENDUM REPORT: 05/28/2016 08:52 ADDENDUM: Study discussed by telephone with Dr. Cruzita Lederer On 05/28/2016 At 0830 hours. Electronically Signed   By: Genevie Ann M.D.   On: 05/28/2016 08:52   Result Date: 05/28/2016 CLINICAL DATA:  70 year old male with gait instability, right facial droop and decreased grip strength in his right hand. Thrombocytopenia. Fever of 101 Fahrenheit. Initial encounter. EXAM: MRI HEAD WITHOUT AND WITH CONTRAST TECHNIQUE: Multiplanar, multiecho pulse sequences of the brain and surrounding structures were obtained without and with intravenous contrast. CONTRAST:  91m MULTIHANCE GADOBENATE DIMEGLUMINE 529 MG/ML IV SOLN COMPARISON:  [facility] Head CT without contrast FINDINGS: Brain: Large lobulated extra-axial mass at the right cerebellopontine angle. The dominant portion of the mass encompasses 33 x 40 x 38 mm (AP by transverse by CC). This has heterogeneously increased T2 signal with facilitated diffusion. The CP angle portion of the mass has fairly homogeneous postcontrast enhancement (there are several areas of nodular hypoenhancement within the mass). However, there is partially solid but more cystic extension of the mass into the right internal auditory canal (series 7,  image 8 and series 16, image 8) subsequently with nonvisualization of the cisternal segments of the right seventh  and eighth cranial nerves (series 12, image 30). Preserved T2 signal in the right cochlea and vestibular structures. No cochlea or vestibule enhancement. There are trace areas of mineralization or chronic blood products within the CP angle portion of the mass (series 8, image 7). No dural tail identified. The lesion was isointense to the brainstem on CT. Associated moderate mass effect on the right brainstem and cerebellum. No brainstem or cerebellar edema. No ventriculomegaly. No other intracranial mass or abnormal intracranial enhancement. No restricted diffusion or evidence of acute infarction. No acute intracranial hemorrhage identified. Negative pituitary. Negative cervicomedullary junction. Mild for age nonspecific cerebral white matter T2 and FLAIR hyperintensity. No cortical encephalomalacia. No parenchymal chronic blood products. Vascular: Major intracranial vascular flow voids are preserved. Skull and upper cervical spine: Degenerative changes in the visible cervical spine. Visualized bone marrow signal is within normal limits. Sinuses/Orbits: Trace paranasal sinus mucosal thickening. Negative orbits soft tissues. Mastoids are clear. Other: Normal stylomastoid foramina. Normal parotid glands. Negative scalp soft tissues. IMPRESSION: 1. Large extra-axial mass at the right cerebellopontine angle measuring up to 4 cm diameter. The cisternal component of the mass is solidly enhancing, however, a predominantly cystic component of the mass tracks into the right IAC all the way to the fundus. 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 Meningioma is felt less likely. 2. Mass effect on the brainstem and cerebellum with no edema. Mild mass effect on the fourth ventricle without ventriculomegaly. 3. No acute infarct or other acute intracranial abnormality. Electronically  Signed: By: Genevie Ann M.D. On: 05/28/2016 08:22    Medications:  Scheduled: . aspirin EC  81 mg Oral Daily  . atenolol  50 mg Oral Daily  . azithromycin  500 mg Intravenous Q24H  . cefTRIAXone (ROCEPHIN)  IV  1 g Intravenous Q24H  . dexamethasone  4 mg Intravenous Q6H  . guaiFENesin  600 mg Oral BID  . insulin aspart  0-15 Units Subcutaneous TID WC  . insulin aspart  0-5 Units Subcutaneous QHS  . oseltamivir  30 mg Oral BID    Assessment: 1. MRI reveals a large extra-axial mass at the right cerebellopontine angle measuring up to 4 cm diameter. The cisternal component of the mass is solidly enhancing, however, a predominantly cystic component of the mass tracks into the right IAC all the way to the fundus. 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 Meningioma is felt less likely. 2. Mass effect on the brainstem and cerebellum with no edema. Mild mass effect on the fourth ventricle without ventriculomegaly  Recommendations: 1. Neurosurgery consult completed. They have recommended outpatient follow in their Neurosurgery clinic after discharge to discuss resection or other treatment.  2. Agree with Neurosurgery recommendations regarding Decadron. Continue current high dose in-house then discharge on 2 mg BID.    3. Neurology will sign off. Please call if there are additional questions.    LOS: 0 days   _0  signed: Dr. Kerney Elbe 05/28/2016  4:12 PM

## 2016-05-28 NOTE — Consult Note (Signed)
Reason for Consult: CP angle tumor Referring Physician: Malyk Girouard is an 70 y.o. male.  HPI: 70 year old male has noted difficulty with balance for the past 3 days. Patient states that he feels unsteady on his feet. He denies symptoms of vertigo. He is not having any symptoms of near syncope. He states he feels like his legs don't move the way he wants them to. Patient with a long history of diminished hearing on right side. Noted to have some facial weakness in the emergency department yesterday. This appears resolved now. No history of malignancy.  Past Medical History:  Diagnosis Date  . Diabetes mellitus without complication ([place])   . Hypertension   . Thrombocytopenia ([place]) 06/30/2011    Past Surgical History:  Procedure Laterality Date  . CHOLECYSTECTOMY    . HERNIA REPAIR      No family history on file.  Social History:  reports that he has never smoked. He has never used smokeless tobacco. He reports that he does not drink alcohol. His drug history is not on file.  Allergies: No Known Allergies  Medications: I have reviewed the patient's current medications.  Results for orders placed or performed during the hospital encounter of 05/27/16 (from the past 48 hour(s))  Ethanol     Status: None   Collection Time: 05/27/16  5:40 PM  Result Value Ref Range   Alcohol, Ethyl (B) <5 <5 mg/dL    Comment:        LOWEST DETECTABLE LIMIT FOR SERUM ALCOHOL IS 5 mg/dL FOR MEDICAL PURPOSES ONLY   Protime-INR     Status: None   Collection Time: 05/27/16  5:40 PM  Result Value Ref Range   Prothrombin Time 14.2 11.4 - 15.2 seconds   INR 1.10   APTT     Status: None   Collection Time: 05/27/16  5:40 PM  Result Value Ref Range   aPTT 35 24 - 36 seconds  CBC     Status: Abnormal   Collection Time: 05/27/16  5:40 PM  Result Value Ref Range   WBC 7.1 4.0 - 10.5 K/uL   RBC 4.71 4.22 - 5.81 MIL/uL   Hemoglobin 13.7 13.0 - 17.0 g/dL   HCT 40.7 39.0 - 52.0 %   MCV 86.4 78.0  - 100.0 fL   MCH 29.1 26.0 - 34.0 pg   MCHC 33.7 30.0 - 36.0 g/dL   RDW 13.6 11.5 - 15.5 %   Platelets 101 (L) 150 - 400 K/uL    Comment: SPECIMEN CHECKED FOR CLOTS PLATELET COUNT CONFIRMED BY SMEAR PLATELETS APPEAR ADEQUATE   Differential     Status: Abnormal   Collection Time: 05/27/16  5:40 PM  Result Value Ref Range   Neutrophils Relative % 88 %   Lymphocytes Relative 6 %   Monocytes Relative 6 %   Eosinophils Relative 0 %   Basophils Relative 0 %   Neutro Abs 6.3 1.7 - 7.7 K/uL   Lymphs Abs 0.4 (L) 0.7 - 4.0 K/uL   Monocytes Absolute 0.4 0.1 - 1.0 K/uL   Eosinophils Absolute 0.0 0.0 - 0.7 K/uL   Basophils Absolute 0.0 0.0 - 0.1 K/uL   Smear Review LARGE PLATELETS PRESENT   Comprehensive metabolic panel     Status: Abnormal   Collection Time: 05/27/16  5:40 PM  Result Value Ref Range   Sodium 135 135 - 145 mmol/L   Potassium 3.4 (L) 3.5 - 5.1 mmol/L   Chloride 99 (L) 101 - 111 mmol/L  CO2 24 22 - 32 mmol/L   Glucose, Bld 239 (H) 65 - 99 mg/dL   BUN 22 (H) 6 - 20 mg/dL   Creatinine, Ser 1.39 (H) 0.61 - 1.24 mg/dL   Calcium 9.2 8.9 - 10.3 mg/dL   Total Protein 7.0 6.5 - 8.1 g/dL   Albumin 3.9 3.5 - 5.0 g/dL   AST 27 15 - 41 U/L   ALT 16 (L) 17 - 63 U/L   Alkaline Phosphatase 50 38 - 126 U/L   Total Bilirubin 0.6 0.3 - 1.2 mg/dL   GFR calc non Af Amer 49 (L) >60 mL/min   GFR calc Af Amer 57 (L) >60 mL/min    Comment: (NOTE) The eGFR has been calculated using the CKD EPI equation. This calculation has not been validated in all clinical situations. eGFR's persistently <60 mL/min signify possible Chronic Kidney Disease.    Anion gap 12 5 - 15  Troponin I     Status: None   Collection Time: 05/27/16  5:40 PM  Result Value Ref Range   Troponin I <0.03 <0.03 ng/mL  I-Stat CG4 Lactic Acid, ED     Status: Abnormal   Collection Time: 05/27/16  5:57 PM  Result Value Ref Range   Lactic Acid, Venous 2.10 (HH) 0.5 - 1.9 mmol/L   Comment NOTIFIED PHYSICIAN   Urine rapid  drug screen (hosp performed)not at [facility]     Status: None   Collection Time: 05/27/16  6:15 PM  Result Value Ref Range   Opiates NONE DETECTED NONE DETECTED   Cocaine NONE DETECTED NONE DETECTED   Benzodiazepines NONE DETECTED NONE DETECTED   Amphetamines NONE DETECTED NONE DETECTED   Tetrahydrocannabinol NONE DETECTED NONE DETECTED   Barbiturates NONE DETECTED NONE DETECTED    Comment:        DRUG SCREEN FOR MEDICAL PURPOSES ONLY.  IF CONFIRMATION IS NEEDED FOR ANY PURPOSE, NOTIFY LAB WITHIN 5 DAYS.        LOWEST DETECTABLE LIMITS FOR URINE DRUG SCREEN Drug Class       Cutoff (ng/mL) Amphetamine      1000 Barbiturate      200 Benzodiazepine   388 Tricyclics       828 Opiates          300 Cocaine          300 THC              50   Urinalysis, Routine w reflex microscopic     Status: None   Collection Time: 05/27/16  6:15 PM  Result Value Ref Range   Color, Urine YELLOW YELLOW   APPearance CLEAR CLEAR   Specific Gravity, Urine 1.022 1.005 - 1.030   pH 6.0 5.0 - 8.0   Glucose, UA NEGATIVE NEGATIVE mg/dL   Hgb urine dipstick NEGATIVE NEGATIVE   Bilirubin Urine NEGATIVE NEGATIVE   Ketones, ur NEGATIVE NEGATIVE mg/dL   Protein, ur NEGATIVE NEGATIVE mg/dL   Nitrite NEGATIVE NEGATIVE   Leukocytes, UA NEGATIVE NEGATIVE    Comment: Microscopic not done on urines with negative protein, blood, leukocytes, nitrite, or glucose < 500 mg/dL.  Glucose, capillary     Status: Abnormal   Collection Time: 05/28/16 12:28 AM  Result Value Ref Range   Glucose-Capillary 110 (H) 65 - 99 mg/dL  Lactic acid, plasma     Status: None   Collection Time: 05/28/16  2:19 AM  Result Value Ref Range   Lactic Acid, Venous 1.3 0.5 - 1.9 mmol/L  Procalcitonin     Status: None   Collection Time: 05/28/16  2:27 AM  Result Value Ref Range   Procalcitonin 0.11 ng/mL    Comment:        Interpretation: PCT (Procalcitonin) <= 0.5 ng/mL: Systemic infection (sepsis) is not likely. Local bacterial infection  is possible. (NOTE)         ICU PCT Algorithm               Non ICU PCT Algorithm    ----------------------------     ------------------------------         PCT < 0.25 ng/mL                 PCT < 0.1 ng/mL     Stopping of antibiotics            Stopping of antibiotics       strongly encouraged.               strongly encouraged.    ----------------------------     ------------------------------       PCT level decrease by               PCT < 0.25 ng/mL       >= 80% from peak PCT       OR PCT 0.25 - 0.5 ng/mL          Stopping of antibiotics                                             encouraged.     Stopping of antibiotics           encouraged.    ----------------------------     ------------------------------       PCT level decrease by              PCT >= 0.25 ng/mL       < 80% from peak PCT        AND PCT >= 0.5 ng/mL            Continuin g antibiotics                                              encouraged.       Continuing antibiotics            encouraged.    ----------------------------     ------------------------------     PCT level increase compared          PCT > 0.5 ng/mL         with peak PCT AND          PCT >= 0.5 ng/mL             Escalation of antibiotics                                          strongly encouraged.      Escalation of antibiotics        strongly encouraged.   CBC     Status: Abnormal   Collection Time: 05/28/16  2:27 AM  Result Value Ref Range   WBC 6.9  4.0 - 10.5 K/uL   RBC 4.77 4.22 - 5.81 MIL/uL   Hemoglobin 13.9 13.0 - 17.0 g/dL   HCT 40.6 39.0 - 52.0 %   MCV 85.1 78.0 - 100.0 fL   MCH 29.1 26.0 - 34.0 pg   MCHC 34.2 30.0 - 36.0 g/dL   RDW 13.5 11.5 - 15.5 %   Platelets 92 (L) 150 - 400 K/uL    Comment: REPEATED TO VERIFY SPECIMEN CHECKED FOR CLOTS PLATELET COUNT CONFIRMED BY SMEAR   Basic metabolic panel     Status: Abnormal   Collection Time: 05/28/16  2:27 AM  Result Value Ref Range   Sodium 137 135 - 145 mmol/L   Potassium  4.0 3.5 - 5.1 mmol/L   Chloride 98 (L) 101 - 111 mmol/L   CO2 28 22 - 32 mmol/L   Glucose, Bld 137 (H) 65 - 99 mg/dL   BUN 20 6 - 20 mg/dL   Creatinine, Ser 1.40 (H) 0.61 - 1.24 mg/dL   Calcium 9.0 8.9 - 10.3 mg/dL   GFR calc non Af Amer 49 (L) >60 mL/min   GFR calc Af Amer 56 (L) >60 mL/min    Comment: (NOTE) The eGFR has been calculated using the CKD EPI equation. This calculation has not been validated in all clinical situations. eGFR's persistently <60 mL/min signify possible Chronic Kidney Disease.    Anion gap 11 5 - 15  Lactic acid, plasma     Status: None   Collection Time: 05/28/16  4:41 AM  Result Value Ref Range   Lactic Acid, Venous 1.4 0.5 - 1.9 mmol/L    Dg Chest 2 View  Result Date: 05/27/2016 CLINICAL DATA:  Fever and non-productive cough x 4 days. Feels like his balance has been off. No n/v. htn and diab. Ex smoker. EXAM: CHEST  2 VIEW COMPARISON:  None. FINDINGS: Normal mediastinum and cardiac silhouette. Chronic central bronchitic markings. Normal pulmonary vasculature. No effusion, infiltrate, or pneumothorax. IMPRESSION: Chronic bronchitic markings.  No acute findings. Electronically Signed   By: Suzy Bouchard M.D.   On: 05/27/2016 18:33   Ct Head Wo Contrast  Addendum Date: 05/28/2016   ADDENDUM REPORT: 05/28/2016 08:20 ADDENDUM: In comparison to MRI 05/28/2016 there is a subtle mass lesion in the RIGHT cerebral pontine angle with effacement of the fourth ventricle. Electronically Signed   By: Suzy Bouchard M.D.   On: 05/28/2016 08:20   Result Date: 05/28/2016 CLINICAL DATA:  RIGHT facial droop and RIGHT arm numbness. EXAM: CT HEAD WITHOUT CONTRAST TECHNIQUE: Contiguous axial images were obtained from the base of the skull through the vertex without intravenous contrast. COMPARISON:  None. FINDINGS: Brain: No evidence of acute infarction, hemorrhage, hydrocephalus, extra-axial collection or mass lesion/mass effect. Vascular: No hyperdense vessel or  unexpected calcification. Skull: Normal. Negative for fracture or focal lesion. Sinuses/Orbits: No acute finding. Other: None. IMPRESSION: No acute intracranial findings. Electronically Signed: By: Suzy Bouchard M.D. On: 05/27/2016 18:22   Mr Blake Herman SW Contrast  Addendum Date: 05/28/2016   ADDENDUM REPORT: 05/28/2016 08:52 ADDENDUM: Study discussed by telephone with Dr. Cruzita Lederer On 05/28/2016 At 0830 hours. Electronically Signed   By: Genevie Ann M.D.   On: 05/28/2016 08:52   Result Date: 05/28/2016 CLINICAL DATA:  70 year old male with gait instability, right facial droop and decreased grip strength in his right hand. Thrombocytopenia. Fever of 101 Fahrenheit. Initial encounter. EXAM: MRI HEAD WITHOUT AND WITH CONTRAST TECHNIQUE: Multiplanar, multiecho pulse sequences of the brain and surrounding structures were  obtained without and with intravenous contrast. CONTRAST:  45m MULTIHANCE GADOBENATE DIMEGLUMINE 529 MG/ML IV SOLN COMPARISON:  [facility] Head CT without contrast FINDINGS: Brain: Large lobulated extra-axial mass at the right cerebellopontine angle. The dominant portion of the mass encompasses 33 x 40 x 38 mm (AP by transverse by CC). This has heterogeneously increased T2 signal with facilitated diffusion. The CP angle portion of the mass has fairly homogeneous postcontrast enhancement (there are several areas of nodular hypoenhancement within the mass). However, there is partially solid but more cystic extension of the mass into the right internal auditory canal (series 7, image 8 and series 16, image 8) subsequently with nonvisualization of the cisternal segments of the right seventh and eighth cranial nerves (series 12, image 30). Preserved T2 signal in the right cochlea and vestibular structures. No cochlea or vestibule enhancement. There are trace areas of mineralization or chronic blood products within the CP angle portion of the mass (series 8, image 7). No dural tail  identified. The lesion was isointense to the brainstem on CT. Associated moderate mass effect on the right brainstem and cerebellum. No brainstem or cerebellar edema. No ventriculomegaly. No other intracranial mass or abnormal intracranial enhancement. No restricted diffusion or evidence of acute infarction. No acute intracranial hemorrhage identified. Negative pituitary. Negative cervicomedullary junction. Mild for age nonspecific cerebral white matter T2 and FLAIR hyperintensity. No cortical encephalomalacia. No parenchymal chronic blood products. Vascular: Major intracranial vascular flow voids are preserved. Skull and upper cervical spine: Degenerative changes in the visible cervical spine. Visualized bone marrow signal is within normal limits. Sinuses/Orbits: Trace paranasal sinus mucosal thickening. Negative orbits soft tissues. Mastoids are clear. Other: Normal stylomastoid foramina. Normal parotid glands. Negative scalp soft tissues. IMPRESSION: 1. Large extra-axial mass at the right cerebellopontine angle measuring up to 4 cm diameter. The cisternal component of the mass is solidly enhancing, however, a predominantly cystic component of the mass tracks into the right IAC all the way to the fundus. 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 Meningioma is felt less likely. 2. Mass effect on the brainstem and cerebellum with no edema. Mild mass effect on the fourth ventricle without ventriculomegaly. 3. No acute infarct or other acute intracranial abnormality. Electronically Signed: By: HGenevie AnnM.D. On: 05/28/2016 08:22    Pertinent items noted in HPI and remainder of comprehensive ROS otherwise negative. Blood pressure (!) 121/44, pulse 83, temperature (!) 100.7 F (38.2 C), temperature source Oral, resp. rate 20, height 5' 11"  (1.803 m), weight 83.1 kg (183 lb 3.2 oz), SpO2 98 %. Patient is awake and alert. He is oriented and appropriate. His speech is fluent. His judgment  and insight are intact. Cranial nerve function reveals normal vision and visual fields bilaterally. Extraocular movements are full. Facial movement and sensation appears normal bilaterally. Tongue protrudes to midline. Palate elevates to midline. Hearing is absent in his right ear. Motor examination extremities 5/5 bilaterally with no pronator drift. Reflexes slightly increased on left otherwise normal. No evidence of long track signs. Gait not tested. Finger to nose testing mildly abnormal right greater than left. Rapid alternating movements intact. Examination head ears eyes and throat service. Chest and abdomen are benign. Extremities are free from injury or deformity.  Assessment/Plan: Patient has evidence of a 4 cm cerebellar pontine angle mass consistent with a large vestibular schwannoma. There is some early brainstem compression. The patient has long-standing hearing loss and may have some intermittent facial nerve dysfunction as well.  At this point I would treat with oral steroids to diminished symptoms of brainstem compression. Mobilize with therapy. Patient likely can go home over the next day or 2. He will need to follow up with me in my office over the next couple weeks. This is certainly not a recently acquired problem nor is it likely to be a tumor which is rapidly growing. We will discuss options for resection or other treatment as an outpatient. Patient may be discharged on 2 mg of Decadron twice a day after treatment with higher dose Decadron through the weekend.  Annalucia Laino A 05/28/2016, 9:08 AM

## 2016-05-28 NOTE — Progress Notes (Signed)
Pharmacy Antibiotic Note  Blake Herman is a 70 y.o. male admitted on 05/27/2016 with pneumonia.  Pharmacy has been consulted for Rocephin and azithromycin dosing.  Plan: Rocephin 1g IV Q24H. Azithromycin 500mg  IV Q24H.  Pharmacy will sign off.  Height: 5\' 11"  (180.3 cm) Weight: 183 lb 3.2 oz (83.1 kg) IBW/kg (Calculated) : 75.3  Temp (24hrs), Avg:100.9 F (38.3 C), Min:99.4 F (37.4 C), Max:102.6 F (39.2 C)   Recent Labs Lab 05/27/16 1740 05/27/16 1757  WBC 7.1  --   CREATININE 1.39*  --   LATICACIDVEN  --  2.10*    Estimated Creatinine Clearance: 51.2 mL/min (by C-G formula based on SCr of 1.39 mg/dL (H)).    No Known Allergies    Thank you for allowing pharmacy to be a part of this patient's care.  Wynona Neat, PharmD, BCPS  05/28/2016 2:13 AM

## 2016-05-28 NOTE — Progress Notes (Signed)
Received pt [facility] via stretcher, a&o x 3. Pt oriented to the unit and room. Call bell within reach. Will cont to monitor.

## 2016-05-29 LAB — URINE CULTURE
Culture: <10000 — AB
Special Requests: NORMAL

## 2016-05-29 LAB — GLUCOSE, CAPILLARY: Glucose-Capillary: 179 mg/dL — ABNORMAL HIGH (ref 65–99)

## 2016-05-29 MED ORDER — OSELTAMIVIR PHOSPHATE 30 MG PO CAPS: 30.0000 mg | ORAL_CAPSULE | Freq: Two times a day (BID) | ORAL | 0 refills | Status: DC

## 2016-05-29 MED ORDER — DEXAMETHASONE 2 MG PO TABS: 2.0000 mg | ORAL_TABLET | Freq: Two times a day (BID) | ORAL | 0 refills | Status: DC

## 2016-05-29 NOTE — Discharge Instructions (Addendum)
Follow with Dr. Annette Stable in a week [phone] Astrocytoma is a type of brain cancer. Astrocytomas are tumors that develop from star-shaped cells called astrocytes. Astrocytes are one type of brain cell that normally helps to provide nutrition, oxygen, and structural support to the brain tissue. A tumor is formed when astrocytes grow into a mass of tissue.  SYMPTOMS Symptoms of astrocytoma may depend on the size and location of the tumor. Possible symptoms include:   Headache, which may be worse in the morning.   Nausea and vomiting.  Vision changes.   Seizures.   Being unable to walk.  Weakness or numbness on one side of the body or in an arm or leg.   Mood changes.   Problems with memory or thinking. DIAGNOSIS Brain tumors can usually be seen on brain imaging, such as CT scan or MRI. A sample of the tumor will need to be studied in a laboratory (biopsy) to confirm the diagnosis of astrocytoma. TREATMENT Because some types of astrocytomas are very slow growing, treatment is sometimes delayed until symptoms affect daily activities. Regular monitoring is performed to track the tumor's growth and to help your health care provider decide when treatment is appropriate. Astrocytomas that grow more quickly usually require treatment right away. They may even grow back after treatment. Several kinds of treatment are used for astrocytoma:   Surgery to remove as much of the tumor as possible.  High-energy rays (radiation therapy) to help shrink or kill the tumor.  Chemotherapy to shrink or kill the tumor. Because normal cells may also be killed, chemotherapy has many side effects.  Targeted therapy uses substances that injure or kill cancer cells without affecting normal cells.  Steroid medicine to decrease brain swelling and improve symptoms. HOME CARE INSTRUCTIONS  Take all medicines as directed by your health care provider.  Go to all of your follow-up appointments. SEEK  MEDICAL CARE IF:  Any symptoms come back.  You have diarrhea, vomiting, or abdominal pain.  You cannot eat or drink what you need.  You are more weak or tired than usual.  You are losing weight without trying. SEEK IMMEDIATE MEDICAL CARE IF:  Your diarrhea, vomiting, or abdominal pain does not go away.  You have new symptoms, such as vision problems or difficulty walking.  You have a seizure.  You have bleeding that does not stop.  You have trouble breathing.  You have a fever. This information is not intended to replace advice given to you by your health care provider. Make sure you discuss any questions you have with your health care provider. Document Released: 05/21/2013 Document Reviewed: 05/21/2013 Elsevier Interactive Patient Education  2017 [place].  Influenza, Adult Influenza (the flu") is an infection in the lungs, nose, and throat (respiratory tract). It is caused by a virus. The flu causes many common cold symptoms, as well as a high fever and body aches. It can make you feel very sick. The flu spreads easily from person to person (is contagious). Getting a flu shot (influenza vaccination) every year is the best way to prevent the flu. Follow these instructions at home:  Take over-the-counter and prescription medicines only as told by your doctor.  Use a cool mist humidifier to add moisture (humidity) to the air in your home. This can make it easier to breathe.  Rest as needed.  Drink enough fluid to keep your pee (urine) clear or pale yellow.  Cover your mouth and nose when you cough or sneeze.  Wash your hands with soap and water often, especially after you cough or sneeze. If you cannot use soap and water, use hand sanitizer.  Stay home from work or school as told by your doctor. Unless you are visiting your doctor, try to avoid leaving home until your fever has been gone for 24 hours without the use of medicine.  Keep all follow-up visits as told  by your doctor. This is important. How is this prevented?  Getting a yearly (annual) flu shot is the best way to avoid getting the flu. You may get the flu shot in late summer, fall, or winter. Ask your doctor when you should get your flu shot.  Wash your hands often or use hand sanitizer often.  Avoid contact with people who are sick during cold and flu season.  Eat healthy foods.  Drink plenty of fluids.  Get enough sleep.  Exercise regularly. Contact a doctor if:  You get new symptoms.  You have:  Chest pain.  Watery poop (diarrhea).  A fever.  Your cough gets worse.  You start to have more mucus.  You feel sick to your stomach (nauseous).  You throw up (vomit). Get help right away if:  You start to be short of breath or have trouble breathing.  Your skin or nails turn a bluish color.  You have very bad pain or stiffness in your neck.  You get a sudden headache.  You get sudden pain in your face or ear.  You cannot stop throwing up. This information is not intended to replace advice given to you by your health care provider. Make sure you discuss any questions you have with your health care provider. Document Released: 02/23/2008 Document Revised: 10/22/2015 Document Reviewed: 03/10/2015 Elsevier Interactive Patient Education  2017 [place] Tamiflu for 4 additional days  Continue Decadron until seen by Dr. Annette Stable  Please get a complete blood count and chemistry panel checked by your Primary MD at your next visit, and again as instructed by your Primary MD. Please get your medications reviewed and adjusted by your Primary MD.  Please request your Primary MD to go over all Hospital Tests and Procedure/Radiological results at the follow up, please get all Hospital records sent to your Prim MD by signing hospital release before you go home.  If you had Pneumonia of Lung problems at the Hospital: Please get a 2 view Chest X ray done in 6-8  weeks after hospital discharge or sooner if instructed by your Primary MD.  If you have Congestive Heart Failure: Please call your Cardiologist or Primary MD anytime you have any of the following symptoms:  1) 3 pound weight gain in 24 hours or 5 pounds in 1 week  2) shortness of breath, with or without a dry hacking cough  3) swelling in the hands, feet or stomach  4) if you have to sleep on extra pillows at night in order to breathe  Follow cardiac low salt diet and 1.5 lit/day fluid restriction.  If you have diabetes Accuchecks 4 times/day, Once in AM empty stomach and then before each meal. Log in all results and show them to your primary doctor at your next visit. If any glucose reading is under 80 or above 300 call your primary MD immediately.  If you have Seizure/Convulsions/Epilepsy: Please do not drive, operate heavy machinery, participate in activities at heights or participate in high speed sports until you have seen by Primary MD or a Neurologist  and advised to do so again.  If you had Gastrointestinal Bleeding: Please ask your Primary MD to check a complete blood count within one week of discharge or at your next visit. Your endoscopic/colonoscopic biopsies that are pending at the time of discharge, will also need to followed by your Primary MD.  Get Medicines reviewed and adjusted. Please take all your medications with you for your next visit with your Primary MD  Please request your Primary MD to go over all hospital tests and procedure/radiological results at the follow up, please ask your Primary MD to get all Hospital records sent to his/her office.  If you experience worsening of your admission symptoms, develop shortness of breath, life threatening emergency, suicidal or homicidal thoughts you must seek medical attention immediately by calling 911 or calling your MD immediately  if symptoms less severe.  You must read complete instructions/literature along with all  the possible adverse reactions/side effects for all the Medicines you take and that have been prescribed to you. Take any new Medicines after you have completely understood and accpet all the possible adverse reactions/side effects.   Do not drive or operate heavy machinery when taking Pain medications.   Do not take more than prescribed Pain, Sleep and Anxiety Medications  Special Instructions: If you have smoked or chewed Tobacco  in the last 2 yrs please stop smoking, stop any regular Alcohol  and or any Recreational drug use.  Wear Seat belts while driving.  Please note You were cared for by a hospitalist during your hospital stay. If you have any questions about your discharge medications or the care you received while you were in the hospital after you are discharged, you can call the unit and asked to speak with the hospitalist on call if the hospitalist that took care of you is not available. Once you are discharged, your primary care physician will handle any further medical issues. Please note that NO REFILLS for any discharge medications will be authorized once you are discharged, as it is imperative that you return to your primary care physician (or establish a relationship with a primary care physician if you do not have one) for your aftercare needs so that they can reassess your need for medications and monitor your lab values.  You can reach the hospitalist office at phone [phone] or fax [phone]   If you do not have a primary care physician, you can call [phone] for a physician referral.  Activity: As tolerated with Full fall precautions use walker/cane & assistance as needed  Diet: diabetic  Disposition Home

## 2016-05-29 NOTE — Progress Notes (Signed)
Pt d/c to home by car with family. Assessment stable. Prescriptions given. All questions answered. 

## 2016-05-29 NOTE — Discharge Summary (Signed)
Physician Discharge Summary  Blake Herman [id number] DOB: November 30, 1945 DOA: 05/27/2016  PCP: Benito Mccreedy, MD  Admit date: 05/27/2016 Discharge date: 05/29/2016  Admitted From: Home Disposition:  Home  Recommendations for Outpatient Follow-up:  1. Follow up with Dr Annette Stable with neurosurgery in 1-2 weeks 2. Patient started on Tamiflu for influenza  Home Health: None Equipment/Devices: Walker, 3 in 1  Discharge Condition: Stable CODE STATUS: Full code Diet recommendation: Regular  HPI: Per Dr. Quentin Angst is a 70 y.o. gentleman with a history of HTN, DM, and thrombocytopenia who presented to the ED in [facility] for evaluation of gait instability, right facial droop, and decreased grip strength in his right hand, as observed by his wife.  No history of slurred speech.  No light-headedness or loss of consciousness.  Symptoms present in the past 24 hours, but he has had cough and congestion all week.  He denies known sick contacts.  No chest pain or shortness of breath.  No nausea, vomiting, or diarrhea.  He spiked a fever to 101.2 in the ED in [facility]. ED Course: Lactic acid level 2.1.  He received 1L NS and empiric IV rocephin and azithromycin.  Chest xray negative for acute process.  Head CT negative for acute intracranial findings.  BUN 22, Creatinine 1.39.  K 3.4.  Platelet 101.  Patient transferred to [facility] for neurology evaluation and management of possible pneumonia.  Hospital Course: Discharge Diagnoses:  Principal Problem:   Stroke-like symptoms Active Problems:   Diabetes ([place])   Essential hypertension, benign   Thrombocytopenia ([facility])   Facial droop   Cough  Large vestibular schwannoma - patient was admitted to the hospital with concern for CVA, he underwent an MRI of the brain which showed a large extra-axial mass at the right cerebellopontine angle, about 4 cm in diameter, with mass effect to the brainstem and cerebellum. Neurosurgery was  consulted, evaluated the patient hospitalized. They recommended Decadron for now, and close outpatient follow-up to determine surgical planning. Physical therapy evaluated patient hospitalized as well. He was discharged on Decadron twice daily until seen by neurosurgery.  URI type symptoms / positive influenza A - Started on ceftriaxone and azithromycin on admission, he was tested for flu and returned positive for influenza A, started on Tamiflu, no need for antimicrobials, we'll complete with 4 additional days on discharge. His fever resolved, and he is feeling back to baseline Chronic thrombocytopenia - Patient seen by oncology as an outpatient, this is stable  Diabetes mellitus - resume home medications Creatinine elevation - 1.4 last night and remained stable on repeat, this puts him in a stage III category and I do suspect he may have underlying chronic kidney disease given long-standing diabetes. This can be further followed up in the outpatient setting.  Discharge Instructions   Allergies as of 05/29/2016   No Known Allergies     Medication List    TAKE these medications   aspirin 81 MG EC tablet TK 1 T PO QD   atenolol-chlorthalidone 50-25 MG tablet Commonly known as:  TENORETIC Take 1 tablet by mouth Daily. 50-25 mg   cholecalciferol 1000 units tablet Commonly known as:  VITAMIN D Take 1,000 Units by mouth daily.   dexamethasone 2 MG tablet Commonly known as:  DECADRON Take 1 tablet (2 mg total) by mouth 2 (two) times daily.   losartan 50 MG tablet Commonly known as:  COZAAR Take 50 mg by mouth Daily.   metFORMIN 500 MG tablet Commonly known  as:  GLUCOPHAGE Take 500 mg by mouth 2 (two) times daily.   oseltamivir 30 MG capsule Commonly known as:  TAMIFLU Take 1 capsule (30 mg total) by mouth 2 (two) times daily.      Follow-up Information    POOL,HENRY A, MD. Schedule an appointment as soon as possible for a visit in 1 week(s).   Specialty:   Neurosurgery Contact information: [address] [place] [address] [phone]          No Known Allergies  Consultations:  Neurology   Neurosurgery   Procedures/Studies:  Dg Chest 2 View  Result Date: 05/27/2016 CLINICAL DATA:  Fever and non-productive cough x 4 days. Feels like his balance has been off. No n/v. htn and diab. Ex smoker. EXAM: CHEST  2 VIEW COMPARISON:  None. FINDINGS: Normal mediastinum and cardiac silhouette. Chronic central bronchitic markings. Normal pulmonary vasculature. No effusion, infiltrate, or pneumothorax. IMPRESSION: Chronic bronchitic markings.  No acute findings. Electronically Signed   By: Suzy Bouchard M.D.   On: 05/27/2016 18:33   Ct Head Wo Contrast  Addendum Date: 05/28/2016   ADDENDUM REPORT: 05/28/2016 08:20 ADDENDUM: In comparison to MRI 05/28/2016 there is a subtle mass lesion in the RIGHT cerebral pontine angle with effacement of the fourth ventricle. Electronically Signed   By: Suzy Bouchard M.D.   On: 05/28/2016 08:20   Result Date: 05/28/2016 CLINICAL DATA:  RIGHT facial droop and RIGHT arm numbness. EXAM: CT HEAD WITHOUT CONTRAST TECHNIQUE: Contiguous axial images were obtained from the base of the skull through the vertex without intravenous contrast. COMPARISON:  None. FINDINGS: Brain: No evidence of acute infarction, hemorrhage, hydrocephalus, extra-axial collection or mass lesion/mass effect. Vascular: No hyperdense vessel or unexpected calcification. Skull: Normal. Negative for fracture or focal lesion. Sinuses/Orbits: No acute finding. Other: None. IMPRESSION: No acute intracranial findings. Electronically Signed: By: Suzy Bouchard M.D. On: 05/27/2016 18:22   Mr Jeri Cos [id number] Contrast  Addendum Date: 05/28/2016   ADDENDUM REPORT: 05/28/2016 08:52 ADDENDUM: Study discussed by telephone with Dr. Cruzita Lederer On 05/28/2016 At 0830 hours. Electronically Signed   By: Genevie Ann M.D.   On: 05/28/2016 08:52    Result Date: 05/28/2016 CLINICAL DATA:  70 year old male with gait instability, right facial droop and decreased grip strength in his right hand. Thrombocytopenia. Fever of 101 Fahrenheit. Initial encounter. EXAM: MRI HEAD WITHOUT AND WITH CONTRAST TECHNIQUE: Multiplanar, multiecho pulse sequences of the brain and surrounding structures were obtained without and with intravenous contrast. CONTRAST:  54mL MULTIHANCE GADOBENATE DIMEGLUMINE 529 MG/ML IV SOLN COMPARISON:  [facility] Head CT without contrast FINDINGS: Brain: Large lobulated extra-axial mass at the right cerebellopontine angle. The dominant portion of the mass encompasses 33 x 40 x 38 mm (AP by transverse by CC). This has heterogeneously increased T2 signal with facilitated diffusion. The CP angle portion of the mass has fairly homogeneous postcontrast enhancement (there are several areas of nodular hypoenhancement within the mass). However, there is partially solid but more cystic extension of the mass into the right internal auditory canal (series 7, image 8 and series 16, image 8) subsequently with nonvisualization of the cisternal segments of the right seventh and eighth cranial nerves (series 12, image 30). Preserved T2 signal in the right cochlea and vestibular structures. No cochlea or vestibule enhancement. There are trace areas of mineralization or chronic blood products within the CP angle portion of the mass (series 8, image 7). No dural tail identified. The lesion was  isointense to the brainstem on CT. Associated moderate mass effect on the right brainstem and cerebellum. No brainstem or cerebellar edema. No ventriculomegaly. No other intracranial mass or abnormal intracranial enhancement. No restricted diffusion or evidence of acute infarction. No acute intracranial hemorrhage identified. Negative pituitary. Negative cervicomedullary junction. Mild for age nonspecific cerebral white matter T2 and FLAIR  hyperintensity. No cortical encephalomalacia. No parenchymal chronic blood products. Vascular: Major intracranial vascular flow voids are preserved. Skull and upper cervical spine: Degenerative changes in the visible cervical spine. Visualized bone marrow signal is within normal limits. Sinuses/Orbits: Trace paranasal sinus mucosal thickening. Negative orbits soft tissues. Mastoids are clear. Other: Normal stylomastoid foramina. Normal parotid glands. Negative scalp soft tissues. IMPRESSION: 1. Large extra-axial mass at the right cerebellopontine angle measuring up to 4 cm diameter. The cisternal component of the mass is solidly enhancing, however, a predominantly cystic component of the mass tracks into the right IAC all the way to the fundus. 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 Meningioma is felt less likely. 2. Mass effect on the brainstem and cerebellum with no edema. Mild mass effect on the fourth ventricle without ventriculomegaly. 3. No acute infarct or other acute intracranial abnormality. Electronically Signed: By: Genevie Ann M.D. On: 05/28/2016 08:22     Subjective: - no chest pain, shortness of breath, no abdominal pain, nausea or vomiting.   Discharge Exam: Vitals:   05/29/16 0602 05/29/16 0949  BP: (!) 101/43 (!) 125/52  Pulse: (!) 49 (!) 54  Resp: 20 18  Temp: 97.6 F (36.4 C) 97.9 F (36.6 C)   Vitals:   05/28/16 2156 05/29/16 0132 05/29/16 0602 05/29/16 0949  BP: (!) 119/53 (!) 106/53 (!) 101/43 (!) 125/52  Pulse: (!) 52 (!) 101 (!) 49 (!) 54  Resp: 18 20 20 18   Temp: 97.8 F (36.6 C) 98.1 F (36.7 C) 97.6 F (36.4 C) 97.9 F (36.6 C)  TempSrc: Oral Oral Oral Oral  SpO2: 97% 97% 98% 94%  Weight:      Height:        General: Pt is alert, awake, not in acute distress Cardiovascular: RRR, S1/S2 +, no rubs, no gallops Respiratory: CTA bilaterally, no wheezing, no rhonchi Abdominal: Soft, NT, ND, bowel sounds + Extremities: no edema,  no cyanosis    The results of significant diagnostics from this hospitalization (including imaging, microbiology, ancillary and laboratory) are listed below for reference.     Microbiology: Recent Results (from the past 240 hour(s))  Culture, blood (routine x 2)     Status: None (Preliminary result)   Collection Time: 05/27/16  5:40 PM  Result Value Ref Range Status   Specimen Description BLOOD LEFT FOREARM  Final   Special Requests BOTTLES DRAWN AEROBIC AND ANAEROBIC 5CC EACH  Final   Culture   Final    NO GROWTH 2 DAYS Performed at [facility]    Report Status PENDING  Incomplete  Urine culture     Status: Abnormal   Collection Time: 05/27/16  6:15 PM  Result Value Ref Range Status   Specimen Description URINE, CLEAN CATCH  Final   Special Requests Normal  Final   Culture (A)  Final    <10,000 COLONIES/mL INSIGNIFICANT GROWTH Performed at [facility]    Report Status 05/29/2016 FINAL  Final  Culture, blood (routine x 2)     Status: None (Preliminary result)   Collection Time: 05/27/16  6:30 PM  Result Value Ref Range Status  Specimen Description BLOOD RIGHT FOREARM  Final   Special Requests BOTTLES DRAWN AEROBIC AND ANAEROBIC 5CC EACH  Final   Culture   Final    NO GROWTH 2 DAYS Performed at [facility]    Report Status PENDING  Incomplete     Labs: BNP (last 3 results) No results for input(s): BNP in the last 8760 hours. Basic Metabolic Panel:  Recent Labs Lab 05/27/16 1740 05/28/16 0227  NA 135 137  K 3.4* 4.0  CL 99* 98*  CO2 24 28  GLUCOSE 239* 137*  BUN 22* 20  CREATININE 1.39* 1.40*  CALCIUM 9.2 9.0   Liver Function Tests:  Recent Labs Lab 05/27/16 1740  AST 27  ALT 16*  ALKPHOS 50  BILITOT 0.6  PROT 7.0  ALBUMIN 3.9   No results for input(s): LIPASE, AMYLASE in the last 168 hours. No results for input(s): AMMONIA in the last 168 hours. CBC:  Recent Labs Lab 05/27/16 1740 05/28/16 0227  WBC 7.1 6.9   NEUTROABS 6.3  --   HGB 13.7 13.9  HCT 40.7 40.6  MCV 86.4 85.1  PLT 101* 92*   Cardiac Enzymes:  Recent Labs Lab 05/27/16 1740  TROPONINI <0.03   BNP: Invalid input(s): POCBNP CBG:  Recent Labs Lab 05/28/16 0028 05/28/16 1136 05/28/16 1608 05/28/16 2136 05/29/16 0659  GLUCAP 110* 201* 200* 235* 179*   D-Dimer No results for input(s): DDIMER in the last 72 hours. Hgb A1c No results for input(s): HGBA1C in the last 72 hours. Lipid Profile No results for input(s): CHOL, HDL, LDLCALC, TRIG, CHOLHDL, LDLDIRECT in the last 72 hours. Thyroid function studies No results for input(s): TSH, T4TOTAL, T3FREE, THYROIDAB in the last 72 hours.  Invalid input(s): FREET3 Anemia work up No results for input(s): VITAMINB12, FOLATE, FERRITIN, TIBC, IRON, RETICCTPCT in the last 72 hours. Urinalysis    Component Value Date/Time   COLORURINE YELLOW 05/27/2016 1815   APPEARANCEUR CLEAR 05/27/2016 1815   LABSPEC 1.022 05/27/2016 1815   PHURINE 6.0 05/27/2016 1815   GLUCOSEU NEGATIVE 05/27/2016 1815   HGBUR NEGATIVE 05/27/2016 1815   BILIRUBINUR NEGATIVE 05/27/2016 1815   KETONESUR NEGATIVE 05/27/2016 1815   PROTEINUR NEGATIVE 05/27/2016 1815   NITRITE NEGATIVE 05/27/2016 1815   LEUKOCYTESUR NEGATIVE 05/27/2016 1815   Sepsis Labs Invalid input(s): PROCALCITONIN,  WBC,  LACTICIDVEN Microbiology Recent Results (from the past 240 hour(s))  Culture, blood (routine x 2)     Status: None (Preliminary result)   Collection Time: 05/27/16  5:40 PM  Result Value Ref Range Status   Specimen Description BLOOD LEFT FOREARM  Final   Special Requests BOTTLES DRAWN AEROBIC AND ANAEROBIC 5CC EACH  Final   Culture   Final    NO GROWTH 2 DAYS Performed at [facility]    Report Status PENDING  Incomplete  Urine culture     Status: Abnormal   Collection Time: 05/27/16  6:15 PM  Result Value Ref Range Status   Specimen Description URINE, CLEAN CATCH  Final   Special Requests Normal   Final   Culture (A)  Final    <10,000 COLONIES/mL INSIGNIFICANT GROWTH Performed at [facility]    Report Status 05/29/2016 FINAL  Final  Culture, blood (routine x 2)     Status: None (Preliminary result)   Collection Time: 05/27/16  6:30 PM  Result Value Ref Range Status   Specimen Description BLOOD RIGHT FOREARM  Final   Special Requests BOTTLES DRAWN AEROBIC AND ANAEROBIC [place]  Final   Culture   Final    NO GROWTH 2 DAYS Performed at [facility]    Report Status PENDING  Incomplete   Time coordinating discharge: Over 30 minutes  SIGNED:  Marzetta Board, MD  [facility] 05/29/2016, 3:21 PM Pager [phone]  If 7PM-7AM, please contact night-coverage [email or web address] Password [email or web address]

## 2016-05-29 NOTE — Progress Notes (Signed)
No issues overnight.   EXAM:  BP (!) 125/52 (BP Location: Left Arm)   Pulse (!) 54   Temp 97.9 F (36.6 C) (Oral)   Resp 18   Ht 5\' 11"  (1.803 m)   Wt 83.1 kg (183 lb 3.2 oz)   SpO2 94%   BMI 25.55 kg/m   Awake, alert, oriented  Speech fluent, appropriate  CN grossly intact  5/5 BUE/BLE   IMPRESSION:  70 y.o. male with newly diagnosed right CPA mass, on steroids  PLAN: - Can be d/c home on PO dexamethasone 2mg  BID , f/u with Dr. Annette Stable in office in the next few weeks

## 2016-05-29 NOTE — Evaluation (Signed)
Occupational Therapy Evaluation and Discharge Patient Details Name: Blake Herman MRN: [id number] DOB: 06-15-1945 Today's Date: 05/29/2016    History of Present Illness 70 y.o. gentleman with a history of HTN, DM, and thrombocytopenia who presented to the ED in [facility] for evaluation of gait instability, right facial droop, and decreased grip strength in his right hand, as observed by his wife.  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   Clinical Impression   Pt reports he was independent with ADL PTA and works as a Building control surveyor. Currently pt supervision for ADL and min guard for functional mobility due to impulsivity and unsteadiness in standing; pt aware of deficits. Educated on fall prevention and home safety. Pt planning to d/c home with 24/7 supervision. Currently, no acute OT needs identified; signing off at this time. Please re-consult if needs change. Thank you for this referral.    Follow Up Recommendations  No OT follow up    Equipment Recommendations  None recommended by OT    Recommendations for Other Services       Precautions / Restrictions Precautions Precautions: Fall Precaution Comments: influenza Restrictions Weight Bearing Restrictions: No      Mobility Bed Mobility Overal bed mobility: Modified Independent             General bed mobility comments: no physical assist  Transfers Overall transfer level: Needs assistance Equipment used: None Transfers: Sit to/from Stand Sit to Stand: Supervision         General transfer comment: supervision for safety due to quick movements and impulsivity    Balance Overall balance assessment:  (mild instability with mobility, pt aware)                                          ADL Overall ADL's : Needs assistance/impaired Eating/Feeding: Independent;Sitting   Grooming: Supervision/safety;Standing;Oral care   Upper Body Bathing:  Supervision/ safety;Standing   Lower Body Bathing: Supervison/ safety;Sit to/from stand   Upper Body Dressing : Set up;Sitting   Lower Body Dressing: Supervision/safety;Sit to/from stand   Toilet Transfer: Min guard;Ambulation;Regular Toilet   Toileting- Water quality scientist and Hygiene: Supervision/safety;Sit to/from stand       Functional mobility during ADLs: Min guard General ADL Comments: Pt impulsive with transfers, moves quickly but understands that he is off balance. Educated pt on fall prevention and supervision for tub transfers/bathing upon return home.     Vision Vision Assessment?: No apparent visual deficits   Perception     Praxis      Pertinent Vitals/Pain Pain Assessment: No/denies pain     Hand Dominance Right   Extremity/Trunk Assessment Upper Extremity Assessment Upper Extremity Assessment: Overall WFL for tasks assessed   Lower Extremity Assessment Lower Extremity Assessment: Defer to PT evaluation   Cervical / Trunk Assessment Cervical / Trunk Assessment: Normal   Communication Communication Communication: No difficulties   Cognition Arousal/Alertness: Awake/alert Behavior During Therapy: WFL for tasks assessed/performed;Impulsive Overall Cognitive Status: Within Functional Limits for tasks assessed                     General Comments       Exercises       Shoulder Instructions      Home Living Family/patient expects to be discharged to:: Private residence Living Arrangements: Spouse/significant other Available Help at Discharge: Family;Available 24 hours/day (works 1/2  day but has other family that can cover) Type of Home: House Home Access: Stairs to enter [employer] of Steps: 3 Entrance Stairs-Rails: Can reach both Home Layout: One level     Bathroom Shower/Tub: Corporate investment banker: Standard     Home Equipment: [employer] - single point          Prior Functioning/Environment  Level of Independence: Independent        Comments: pt was independent until 1 week ago, works as a Sport and exercise psychologist Problem List:     OT Treatment/Interventions:      OT Goals(Current goals can be found in the care plan section) Acute Rehab OT Goals Patient Stated Goal: home OT Goal Formulation: All assessment and education complete, DC therapy  OT Frequency:     Barriers to D/C:            Co-evaluation PT/OT/SLP Co-Evaluation/Treatment: Yes Reason for Co-Treatment: To address functional/ADL transfers PT goals addressed during session: Mobility/safety with mobility OT goals addressed during session: ADL's and self-care      End of Session Equipment Utilized During Treatment: Gait belt Nurse Communication: Mobility status  Activity Tolerance: Patient tolerated treatment well Patient left: in chair;with call bell/phone within reach;with chair alarm set   Time: [id number] OT Time Calculation (min): 19 min Charges:  OT General Charges $OT Visit: 1 Procedure OT Evaluation $OT Eval Moderate Complexity: 1 Procedure G-Codes:     Binnie Kand M.S., OTR/L Pager: [phone]  05/29/2016, 11:19 AM

## 2016-05-29 NOTE — Evaluation (Signed)
Physical Therapy Evaluation and Discharge Patient Details Name: Blake Herman MRN: [id number] DOB: November 06, 1945 Today's Date: 05/29/2016   History of Present Illness  70 y.o. gentleman with a history of HTN, DM, and thrombocytopenia who presented to the ED in [facility] for evaluation of gait instability, right facial droop, and decreased grip strength in his right hand, as observed by his wife.  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  Clinical Impression  Pt admitted with above. Pt aware of instability and is cautious however at the same time is impulsively fast. Pt with very supportive family who can provide 24/7 assist. Pt with no further acute PT needs as pt with known cerebellar mass and is going to neurosurgery outpatient appointment to come up with medical plan. PT SIGNING OFF. Please re-consult if needed in future.    Follow Up Recommendations No PT follow up    Equipment Recommendations  None recommended by PT    Recommendations for Other Services       Precautions / Restrictions Precautions Precautions: Fall Precaution Comments: influenza Restrictions Weight Bearing Restrictions: No      Mobility  Bed Mobility Overal bed mobility: Modified Independent             General bed mobility comments: no physical assist  Transfers Overall transfer level: Needs assistance Equipment used: None Transfers: Sit to/from Stand Sit to Stand: Supervision         General transfer comment: supervision for safety due to quick movements and impulsivity  Ambulation/Gait Ambulation/Gait assistance: Min guard Ambulation Distance (Feet): 120 Feet Assistive device: None Gait Pattern/deviations: WFL(Within Functional Limits) Gait velocity: impulsively fast   General Gait Details: mild staggering but no epsiodes of LOB, min guard for safety due to quick pace  Stairs Stairs: Yes Stairs assistance: Min guard Stair  Management: Alternating pattern;Two rails Number of Stairs: 12    Wheelchair Mobility    Modified Rankin (Stroke Patients Only)       Balance Overall balance assessment:  (mild instabilty with ambulation, pt aware)                                           Pertinent Vitals/Pain Pain Assessment: No/denies pain    Home Living Family/patient expects to be discharged to:: Private residence Living Arrangements: Spouse/significant other Available Help at Discharge: Family;Available 24 hours/day (works 1/2 day but has other family that can cover) Type of Home: House Home Access: Stairs to enter Entrance Stairs-Rails: Can reach both Entrance Stairs-Number of Steps: 3 Home Layout: One level Home Equipment: [employer] - single point      Prior Function Level of Independence: Independent         Comments: pt was independent until 1 week ago, works as a Nurse, adult   Dominant Hand: Right    Extremity/Trunk Assessment   Upper Extremity Assessment Upper Extremity Assessment: Defer to OT evaluation    Lower Extremity Assessment Lower Extremity Assessment: Overall WFL for tasks assessed    Cervical / Trunk Assessment Cervical / Trunk Assessment: Normal  Communication   Communication: No difficulties  Cognition Arousal/Alertness: Awake/alert Behavior During Therapy: Impulsive Overall Cognitive Status: Within Functional Limits for tasks assessed                      General Comments  Exercises     Assessment/Plan    PT Assessment Patent does not need any further PT services  PT Problem List            PT Treatment Interventions      PT Goals (Current goals can be found in the Care Plan section)  Acute Rehab PT Goals Patient Stated Goal: home PT Goal Formulation: All assessment and education complete, DC therapy    Frequency     Barriers to discharge        Co-evaluation PT/OT/SLP Co-Evaluation/Treatment:  Yes Reason for Co-Treatment: To address functional/ADL transfers PT goals addressed during session: Mobility/safety with mobility OT goals addressed during session: ADL's and self-care       End of Session Equipment Utilized During Treatment: Gait belt Activity Tolerance: Patient tolerated treatment well Patient left: in chair;with chair alarm set Nurse Communication: Mobility status         Time: [id number] PT Time Calculation (min) (ACUTE ONLY): 19 min   Charges:   PT Evaluation $PT Eval Low Complexity: 1 Procedure     PT G Codes:        Saba Neuman M Daxon Kyne 05/29/2016, 10:57 AM  Kittie Plater, PT, DPT Pager #: [phone] Office #: [phone]

## 2016-06-01 LAB — CULTURE, BLOOD (ROUTINE X 2)
CULTURE: NO GROWTH
Culture: NO GROWTH

## 2016-06-09 DIAGNOSIS — D333 Benign neoplasm of cranial nerves: Secondary | ICD-10-CM | POA: Diagnosis not present

## 2016-06-18 DIAGNOSIS — N400 Enlarged prostate without lower urinary tract symptoms: Secondary | ICD-10-CM | POA: Diagnosis not present

## 2016-06-18 DIAGNOSIS — K08109 Complete loss of teeth, unspecified cause, unspecified class: Secondary | ICD-10-CM | POA: Diagnosis not present

## 2016-06-18 DIAGNOSIS — Z0000 Encounter for general adult medical examination without abnormal findings: Secondary | ICD-10-CM | POA: Diagnosis not present

## 2016-06-18 DIAGNOSIS — E119 Type 2 diabetes mellitus without complications: Secondary | ICD-10-CM | POA: Diagnosis not present

## 2016-06-18 DIAGNOSIS — Z972 Presence of dental prosthetic device (complete) (partial): Secondary | ICD-10-CM | POA: Diagnosis not present

## 2016-06-18 DIAGNOSIS — H259 Unspecified age-related cataract: Secondary | ICD-10-CM | POA: Diagnosis not present

## 2016-06-18 DIAGNOSIS — Z7982 Long term (current) use of aspirin: Secondary | ICD-10-CM | POA: Diagnosis not present

## 2016-06-18 DIAGNOSIS — Z6825 Body mass index (BMI) 25.0-25.9, adult: Secondary | ICD-10-CM | POA: Diagnosis not present

## 2016-06-18 DIAGNOSIS — Z87891 Personal history of nicotine dependence: Secondary | ICD-10-CM | POA: Diagnosis not present

## 2016-06-18 DIAGNOSIS — I10 Essential (primary) hypertension: Secondary | ICD-10-CM | POA: Diagnosis not present

## 2016-06-18 DIAGNOSIS — G479 Sleep disorder, unspecified: Secondary | ICD-10-CM | POA: Diagnosis not present

## 2016-06-18 DIAGNOSIS — E663 Overweight: Secondary | ICD-10-CM | POA: Diagnosis not present

## 2016-07-08 DIAGNOSIS — D333 Benign neoplasm of cranial nerves: Secondary | ICD-10-CM | POA: Diagnosis not present

## 2016-09-09 DIAGNOSIS — D696 Thrombocytopenia, unspecified: Secondary | ICD-10-CM | POA: Diagnosis not present

## 2016-09-09 DIAGNOSIS — M109 Gout, unspecified: Secondary | ICD-10-CM | POA: Diagnosis not present

## 2016-09-09 DIAGNOSIS — Z0000 Encounter for general adult medical examination without abnormal findings: Secondary | ICD-10-CM | POA: Diagnosis not present

## 2016-09-09 DIAGNOSIS — E559 Vitamin D deficiency, unspecified: Secondary | ICD-10-CM | POA: Diagnosis not present

## 2016-09-09 DIAGNOSIS — I119 Hypertensive heart disease without heart failure: Secondary | ICD-10-CM | POA: Diagnosis not present

## 2016-09-09 DIAGNOSIS — I10 Essential (primary) hypertension: Secondary | ICD-10-CM | POA: Diagnosis not present

## 2016-09-09 DIAGNOSIS — Z01118 Encounter for examination of ears and hearing with other abnormal findings: Secondary | ICD-10-CM | POA: Diagnosis not present

## 2016-09-09 DIAGNOSIS — R69 Illness, unspecified: Secondary | ICD-10-CM | POA: Diagnosis not present

## 2016-09-09 DIAGNOSIS — E119 Type 2 diabetes mellitus without complications: Secondary | ICD-10-CM | POA: Diagnosis not present

## 2016-10-02 DIAGNOSIS — K4090 Unilateral inguinal hernia, without obstruction or gangrene, not specified as recurrent: Secondary | ICD-10-CM | POA: Diagnosis not present

## 2016-10-02 DIAGNOSIS — K429 Umbilical hernia without obstruction or gangrene: Secondary | ICD-10-CM | POA: Diagnosis not present

## 2016-10-08 DIAGNOSIS — R69 Illness, unspecified: Secondary | ICD-10-CM | POA: Diagnosis not present

## 2016-10-11 DIAGNOSIS — K429 Umbilical hernia without obstruction or gangrene: Secondary | ICD-10-CM | POA: Diagnosis not present

## 2016-10-11 DIAGNOSIS — K4090 Unilateral inguinal hernia, without obstruction or gangrene, not specified as recurrent: Secondary | ICD-10-CM | POA: Diagnosis not present

## 2016-10-18 DIAGNOSIS — K429 Umbilical hernia without obstruction or gangrene: Secondary | ICD-10-CM | POA: Diagnosis not present

## 2016-10-18 DIAGNOSIS — K439 Ventral hernia without obstruction or gangrene: Secondary | ICD-10-CM | POA: Diagnosis not present

## 2016-10-18 DIAGNOSIS — K4090 Unilateral inguinal hernia, without obstruction or gangrene, not specified as recurrent: Secondary | ICD-10-CM | POA: Diagnosis not present

## 2016-11-04 DIAGNOSIS — I10 Essential (primary) hypertension: Secondary | ICD-10-CM | POA: Diagnosis not present

## 2016-11-04 DIAGNOSIS — L03119 Cellulitis of unspecified part of limb: Secondary | ICD-10-CM | POA: Diagnosis not present

## 2016-11-04 DIAGNOSIS — M79671 Pain in right foot: Secondary | ICD-10-CM | POA: Diagnosis not present

## 2016-11-04 DIAGNOSIS — E1165 Type 2 diabetes mellitus with hyperglycemia: Secondary | ICD-10-CM | POA: Diagnosis not present

## 2016-11-04 DIAGNOSIS — Z79899 Other long term (current) drug therapy: Secondary | ICD-10-CM | POA: Diagnosis not present

## 2016-11-11 DIAGNOSIS — E1165 Type 2 diabetes mellitus with hyperglycemia: Secondary | ICD-10-CM | POA: Diagnosis not present

## 2016-11-11 DIAGNOSIS — E876 Hypokalemia: Secondary | ICD-10-CM | POA: Diagnosis not present

## 2016-11-11 DIAGNOSIS — L03119 Cellulitis of unspecified part of limb: Secondary | ICD-10-CM | POA: Diagnosis not present

## 2016-11-13 DIAGNOSIS — R69 Illness, unspecified: Secondary | ICD-10-CM | POA: Diagnosis not present

## 2016-11-17 DIAGNOSIS — D333 Benign neoplasm of cranial nerves: Secondary | ICD-10-CM | POA: Diagnosis not present

## 2016-11-24 DIAGNOSIS — R900 Intracranial space-occupying lesion found on diagnostic imaging of central nervous system: Secondary | ICD-10-CM | POA: Diagnosis not present

## 2016-11-24 DIAGNOSIS — D333 Benign neoplasm of cranial nerves: Secondary | ICD-10-CM | POA: Diagnosis not present

## 2016-12-01 DIAGNOSIS — Z6826 Body mass index (BMI) 26.0-26.9, adult: Secondary | ICD-10-CM | POA: Diagnosis not present

## 2016-12-01 DIAGNOSIS — D333 Benign neoplasm of cranial nerves: Secondary | ICD-10-CM | POA: Diagnosis not present

## 2016-12-01 DIAGNOSIS — I10 Essential (primary) hypertension: Secondary | ICD-10-CM | POA: Diagnosis not present

## 2016-12-13 DIAGNOSIS — R69 Illness, unspecified: Secondary | ICD-10-CM | POA: Diagnosis not present

## 2016-12-16 DIAGNOSIS — I119 Hypertensive heart disease without heart failure: Secondary | ICD-10-CM | POA: Diagnosis not present

## 2016-12-16 DIAGNOSIS — M109 Gout, unspecified: Secondary | ICD-10-CM | POA: Diagnosis not present

## 2016-12-16 DIAGNOSIS — D696 Thrombocytopenia, unspecified: Secondary | ICD-10-CM | POA: Diagnosis not present

## 2016-12-16 DIAGNOSIS — E119 Type 2 diabetes mellitus without complications: Secondary | ICD-10-CM | POA: Diagnosis not present

## 2016-12-16 DIAGNOSIS — E559 Vitamin D deficiency, unspecified: Secondary | ICD-10-CM | POA: Diagnosis not present

## 2016-12-16 DIAGNOSIS — R69 Illness, unspecified: Secondary | ICD-10-CM | POA: Diagnosis not present

## 2016-12-16 DIAGNOSIS — E039 Hypothyroidism, unspecified: Secondary | ICD-10-CM | POA: Diagnosis not present

## 2016-12-16 DIAGNOSIS — I10 Essential (primary) hypertension: Secondary | ICD-10-CM | POA: Diagnosis not present

## 2017-01-12 DIAGNOSIS — R69 Illness, unspecified: Secondary | ICD-10-CM | POA: Diagnosis not present

## 2017-01-13 DIAGNOSIS — E559 Vitamin D deficiency, unspecified: Secondary | ICD-10-CM | POA: Diagnosis not present

## 2017-01-13 DIAGNOSIS — I119 Hypertensive heart disease without heart failure: Secondary | ICD-10-CM | POA: Diagnosis not present

## 2017-01-13 DIAGNOSIS — D696 Thrombocytopenia, unspecified: Secondary | ICD-10-CM | POA: Diagnosis not present

## 2017-01-13 DIAGNOSIS — M109 Gout, unspecified: Secondary | ICD-10-CM | POA: Diagnosis not present

## 2017-01-13 DIAGNOSIS — R69 Illness, unspecified: Secondary | ICD-10-CM | POA: Diagnosis not present

## 2017-01-13 DIAGNOSIS — I10 Essential (primary) hypertension: Secondary | ICD-10-CM | POA: Diagnosis not present

## 2017-01-13 DIAGNOSIS — E039 Hypothyroidism, unspecified: Secondary | ICD-10-CM | POA: Diagnosis not present

## 2017-01-13 DIAGNOSIS — E119 Type 2 diabetes mellitus without complications: Secondary | ICD-10-CM | POA: Diagnosis not present

## 2017-01-15 DIAGNOSIS — R69 Illness, unspecified: Secondary | ICD-10-CM | POA: Diagnosis not present

## 2017-02-24 DIAGNOSIS — I10 Essential (primary) hypertension: Secondary | ICD-10-CM | POA: Diagnosis not present

## 2017-02-24 DIAGNOSIS — E119 Type 2 diabetes mellitus without complications: Secondary | ICD-10-CM | POA: Diagnosis not present

## 2017-02-24 DIAGNOSIS — E039 Hypothyroidism, unspecified: Secondary | ICD-10-CM | POA: Diagnosis not present

## 2017-02-24 DIAGNOSIS — D696 Thrombocytopenia, unspecified: Secondary | ICD-10-CM | POA: Diagnosis not present

## 2017-02-24 DIAGNOSIS — R69 Illness, unspecified: Secondary | ICD-10-CM | POA: Diagnosis not present

## 2017-02-24 DIAGNOSIS — I119 Hypertensive heart disease without heart failure: Secondary | ICD-10-CM | POA: Diagnosis not present

## 2017-02-24 DIAGNOSIS — E559 Vitamin D deficiency, unspecified: Secondary | ICD-10-CM | POA: Diagnosis not present

## 2017-02-24 DIAGNOSIS — Z0000 Encounter for general adult medical examination without abnormal findings: Secondary | ICD-10-CM | POA: Diagnosis not present

## 2017-02-24 DIAGNOSIS — M109 Gout, unspecified: Secondary | ICD-10-CM | POA: Diagnosis not present

## 2017-02-25 DIAGNOSIS — E119 Type 2 diabetes mellitus without complications: Secondary | ICD-10-CM | POA: Diagnosis not present

## 2017-02-25 DIAGNOSIS — H52223 Regular astigmatism, bilateral: Secondary | ICD-10-CM | POA: Diagnosis not present

## 2017-03-23 DIAGNOSIS — K648 Other hemorrhoids: Secondary | ICD-10-CM | POA: Diagnosis not present

## 2017-03-23 DIAGNOSIS — K5730 Diverticulosis of large intestine without perforation or abscess without bleeding: Secondary | ICD-10-CM | POA: Diagnosis not present

## 2017-03-23 DIAGNOSIS — Z1211 Encounter for screening for malignant neoplasm of colon: Secondary | ICD-10-CM | POA: Diagnosis not present

## 2017-03-23 DIAGNOSIS — Z86010 Personal history of colonic polyps: Secondary | ICD-10-CM | POA: Diagnosis not present

## 2017-05-05 DIAGNOSIS — M109 Gout, unspecified: Secondary | ICD-10-CM | POA: Diagnosis not present

## 2017-05-05 DIAGNOSIS — E559 Vitamin D deficiency, unspecified: Secondary | ICD-10-CM | POA: Diagnosis not present

## 2017-05-05 DIAGNOSIS — E039 Hypothyroidism, unspecified: Secondary | ICD-10-CM | POA: Diagnosis not present

## 2017-05-05 DIAGNOSIS — R69 Illness, unspecified: Secondary | ICD-10-CM | POA: Diagnosis not present

## 2017-05-05 DIAGNOSIS — I10 Essential (primary) hypertension: Secondary | ICD-10-CM | POA: Diagnosis not present

## 2017-05-05 DIAGNOSIS — E119 Type 2 diabetes mellitus without complications: Secondary | ICD-10-CM | POA: Diagnosis not present

## 2017-05-05 DIAGNOSIS — D696 Thrombocytopenia, unspecified: Secondary | ICD-10-CM | POA: Diagnosis not present

## 2017-05-05 DIAGNOSIS — I119 Hypertensive heart disease without heart failure: Secondary | ICD-10-CM | POA: Diagnosis not present

## 2017-05-11 ENCOUNTER — Emergency Department (HOSPITAL_BASED_OUTPATIENT_CLINIC_OR_DEPARTMENT_OTHER)
Admission: EM | Admit: 2017-05-11 | Discharge: 2017-05-11 | Disposition: A | Discharge status: Home / Self Care | Payer: Medicare HMO | Attending: Emergency Medicine | Admitting: Emergency Medicine

## 2017-05-11 ENCOUNTER — Emergency Department (HOSPITAL_BASED_OUTPATIENT_CLINIC_OR_DEPARTMENT_OTHER): Payer: Medicare HMO

## 2017-05-11 ENCOUNTER — Encounter (HOSPITAL_BASED_OUTPATIENT_CLINIC_OR_DEPARTMENT_OTHER): Payer: Self-pay | Admitting: *Deleted

## 2017-05-11 ENCOUNTER — Other Ambulatory Visit: Payer: Self-pay

## 2017-05-11 DIAGNOSIS — Z7984 Long term (current) use of oral hypoglycemic drugs: Secondary | ICD-10-CM | POA: Insufficient documentation

## 2017-05-11 DIAGNOSIS — Z7982 Long term (current) use of aspirin: Secondary | ICD-10-CM | POA: Diagnosis not present

## 2017-05-11 DIAGNOSIS — M5432 Sciatica, left side: Secondary | ICD-10-CM | POA: Diagnosis not present

## 2017-05-11 DIAGNOSIS — Z79899 Other long term (current) drug therapy: Secondary | ICD-10-CM | POA: Diagnosis not present

## 2017-05-11 DIAGNOSIS — M79605 Pain in left leg: Secondary | ICD-10-CM | POA: Diagnosis not present

## 2017-05-11 DIAGNOSIS — I10 Essential (primary) hypertension: Secondary | ICD-10-CM | POA: Insufficient documentation

## 2017-05-11 DIAGNOSIS — E119 Type 2 diabetes mellitus without complications: Secondary | ICD-10-CM | POA: Insufficient documentation

## 2017-05-11 MED ORDER — ACETAMINOPHEN 325 MG PO TABS
650.0000 mg | ORAL_TABLET | Freq: Once | ORAL | Status: AC
  Administered 2017-05-11: 650 mg via ORAL
  Filled 2017-05-11: qty 2

## 2017-05-11 NOTE — ED Triage Notes (Signed)
Left leg pain. No injury. He had the same pain 5 years ago and was seen by an orthopedic specialist who told him it was the result of a pinched nerve.

## 2017-05-11 NOTE — ED Provider Notes (Signed)
[facility] EMERGENCY DEPARTMENT Provider Note   CSN: [id number] Arrival date & time: 05/11/17  1746     History   Chief Complaint Chief Complaint  Patient presents with  . Leg Pain    HPI Blake Herman is a 71 y.o. male.  HPI  71 year old male with a history of diabetes and hypertension presents with recurrent left leg pain.  He states for the last 3 days has been having pain that runs down his left leg.  Starts on his left lateral hip only when he stands up and walks.  At rest he has minimal pain.  She is down the lateral aspect of his leg.  There is no calf pain or swelling.  Similar to when he had sciatica a couple years ago and was told by an orthopedist he had a pinched nerve.  No surgeries were performed.  He denies any weakness or numbness in his legs.  No back pain or back injury.  Tried 2 Tylenol tablets once without relief.  Past Medical History:  Diagnosis Date  . Diabetes mellitus without complication ([place])   . Hypertension   . Thrombocytopenia ([place]) 06/30/2011    Patient Active Problem List   Diagnosis Date Noted  . Cough 05/28/2016  . Stroke-like symptoms 05/28/2016  . Facial droop 05/27/2016  . Pain in joint of left foot 11/14/2014  . Arthritis due to gout 11/14/2014  . Thrombocytopenia ([place]) 06/30/2011  . Diabetes ([place]) 06/10/2010  . Essential hypertension, benign 06/10/2010  . BACK PAIN 06/10/2010    Past Surgical History:  Procedure Laterality Date  . CHOLECYSTECTOMY    . HERNIA REPAIR         Home Medications    Prior to Admission medications   Medication Sig Start Date End Date Taking? Authorizing Provider  aspirin 81 MG EC tablet TK 1 T PO QD 10/10/14   [provider]  atenolol-chlorthalidone (TENORETIC) 50-25 MG per tablet Take 1 tablet by mouth Daily. 50-25 mg 06/25/11   [provider]  cholecalciferol (VITAMIN D) 1000 units tablet Take 1,000 Units by mouth daily.    [provider]  dexamethasone  (DECADRON) 2 MG tablet Take 1 tablet (2 mg total) by mouth 2 (two) times daily. 05/29/16   Caren Griffins, MD  losartan (COZAAR) 50 MG tablet Take 50 mg by mouth Daily. 06/22/11   [provider]  metFORMIN (GLUCOPHAGE) 500 MG tablet Take 500 mg by mouth 2 (two) times daily. 05/24/16   [provider]  oseltamivir (TAMIFLU) 30 MG capsule Take 1 capsule (30 mg total) by mouth 2 (two) times daily. 05/29/16   Caren Griffins, MD    Family History No family history on file.  Social History Social History   Tobacco Use  . Smoking status: Never Smoker  . Smokeless tobacco: Never Used  Substance Use Topics  . Alcohol use: No    Alcohol/week: 0.0 oz  . Drug use: Not on file     Allergies   Patient has no known allergies.   Review of Systems Review of Systems  Cardiovascular: Negative for leg swelling.  Gastrointestinal: Negative for abdominal pain.  Genitourinary:       No incontinence  Musculoskeletal: Negative for back pain.       Left leg pain  Neurological: Negative for weakness and numbness.  All other systems reviewed and are negative.    Physical Exam Updated Vital Signs BP (!) 149/69   Pulse 70   Temp 98  F (36.7 C) (Oral)   Resp 20   Ht 5\' 11"  (1.803 m)   Wt 81.6 kg (180 lb)   SpO2 100%   BMI 25.10 kg/m   Physical Exam  Constitutional: He is oriented to person, place, and time. He appears well-developed and well-nourished.  HENT:  Head: Normocephalic and atraumatic.  Right Ear: External ear normal.  Left Ear: External ear normal.  Nose: Nose normal.  Eyes: Right eye exhibits no discharge. Left eye exhibits no discharge.  Neck: Neck supple.  Cardiovascular: Normal rate and regular rhythm.  Pulses:      Dorsalis pedis pulses are 2+ on the right side, and 2+ on the left side.  Pulmonary/Chest: Effort normal.  Abdominal: He exhibits no distension.  Musculoskeletal: He exhibits no edema.       Left hip: He exhibits normal range of  motion and no tenderness.       Left knee: No tenderness found.       Left ankle: No tenderness.       Lumbar back: He exhibits no tenderness.       Left upper leg: He exhibits no tenderness.       Left lower leg: He exhibits no tenderness.  Neurological: He is alert and oriented to person, place, and time.  5/5 strength in BLE. Normal gross sensation. Able to ambulate, though it causes a limp in left leg with ambulation  Skin: Skin is warm and dry.  Nursing note and vitals reviewed.    ED Treatments / Results  Labs (all labs ordered are listed, but only abnormal results are displayed) Labs Reviewed - No data to display  EKG  EKG Interpretation None       Radiology No results found.  Procedures Procedures (including critical care time)  Medications Ordered in ED Medications  acetaminophen (TYLENOL) tablet 650 mg (650 mg Oral Given 05/11/17 1843)     Initial Impression / Assessment and Plan / ED Course  I have reviewed the triage vital signs and the nursing notes.  Pertinent labs & imaging results that were available during my care of the patient were reviewed by me and considered in my medical decision making (see chart for details).     Presentation is consistent with recurrent sciatica.  He has no abdominal or back pain.  He is neurovascularly intact.  There is no leg swelling or calf pain to suggest DVT.  Minimal to no pain at rest, pain only with weightbearing.  However he has no hip pain or decreased range of motion of his hip.  No injuries.  I do not think imaging is emergently needed at this time.  I have very low suspicion for AAA or spinal emergency.  Given this is occurred in the past, recommend outpatient follow-up with PCP and referral to physical therapy.  Have given exercises as well.  He is now anything stronger than Tylenol and I do not think steroids would be helpful given likely limited benefit anyway as well as him being a diabetic.  Discussed return  precautions.  Final Clinical Impressions(s) / ED Diagnoses   Final diagnoses:  Left sided sciatica    ED Discharge Orders    None       Sherwood Gambler, MD 05/11/17 [phone]

## 2017-05-11 NOTE — ED Notes (Signed)
Pt states the pain is similar to before when his orthopedic diagnosed a "pinched nerve."

## 2017-06-09 DIAGNOSIS — I10 Essential (primary) hypertension: Secondary | ICD-10-CM | POA: Diagnosis not present

## 2017-06-09 DIAGNOSIS — E559 Vitamin D deficiency, unspecified: Secondary | ICD-10-CM | POA: Diagnosis not present

## 2017-06-09 DIAGNOSIS — D696 Thrombocytopenia, unspecified: Secondary | ICD-10-CM | POA: Diagnosis not present

## 2017-06-09 DIAGNOSIS — M109 Gout, unspecified: Secondary | ICD-10-CM | POA: Diagnosis not present

## 2017-06-09 DIAGNOSIS — I119 Hypertensive heart disease without heart failure: Secondary | ICD-10-CM | POA: Diagnosis not present

## 2017-06-09 DIAGNOSIS — E119 Type 2 diabetes mellitus without complications: Secondary | ICD-10-CM | POA: Diagnosis not present

## 2017-06-09 DIAGNOSIS — M5417 Radiculopathy, lumbosacral region: Secondary | ICD-10-CM | POA: Diagnosis not present

## 2017-06-09 DIAGNOSIS — R69 Illness, unspecified: Secondary | ICD-10-CM | POA: Diagnosis not present

## 2017-06-09 DIAGNOSIS — E039 Hypothyroidism, unspecified: Secondary | ICD-10-CM | POA: Diagnosis not present

## 2017-09-11 DIAGNOSIS — Z131 Encounter for screening for diabetes mellitus: Secondary | ICD-10-CM | POA: Diagnosis not present

## 2017-09-11 DIAGNOSIS — D696 Thrombocytopenia, unspecified: Secondary | ICD-10-CM | POA: Diagnosis not present

## 2017-09-11 DIAGNOSIS — E039 Hypothyroidism, unspecified: Secondary | ICD-10-CM | POA: Diagnosis not present

## 2017-09-11 DIAGNOSIS — I119 Hypertensive heart disease without heart failure: Secondary | ICD-10-CM | POA: Diagnosis not present

## 2017-09-11 DIAGNOSIS — E119 Type 2 diabetes mellitus without complications: Secondary | ICD-10-CM | POA: Diagnosis not present

## 2017-09-11 DIAGNOSIS — I10 Essential (primary) hypertension: Secondary | ICD-10-CM | POA: Diagnosis not present

## 2017-09-11 DIAGNOSIS — R69 Illness, unspecified: Secondary | ICD-10-CM | POA: Diagnosis not present

## 2017-09-11 DIAGNOSIS — Z5181 Encounter for therapeutic drug level monitoring: Secondary | ICD-10-CM | POA: Diagnosis not present

## 2017-09-11 DIAGNOSIS — Z01118 Encounter for examination of ears and hearing with other abnormal findings: Secondary | ICD-10-CM | POA: Diagnosis not present

## 2017-09-11 DIAGNOSIS — Z136 Encounter for screening for cardiovascular disorders: Secondary | ICD-10-CM | POA: Diagnosis not present

## 2017-09-11 DIAGNOSIS — E559 Vitamin D deficiency, unspecified: Secondary | ICD-10-CM | POA: Diagnosis not present

## 2017-09-11 DIAGNOSIS — Z0000 Encounter for general adult medical examination without abnormal findings: Secondary | ICD-10-CM | POA: Diagnosis not present

## 2017-09-24 IMAGING — CT CT HEAD W/O CM
3 series · 14 of 47 positions shown, 16 images · non-contrast
Comparison: None.

ADDENDUM:
In comparison to MRI 05/28/2016 there is a subtle mass lesion in the
RIGHT cerebral pontine angle with effacement of the fourth
ventricle.
CLINICAL DATA: RIGHT facial droop and RIGHT arm numbness.

EXAM:
CT HEAD WITHOUT CONTRAST
TECHNIQUE: Contiguous axial images were obtained from the base of the skull
through the vertex without intravenous contrast.

[Series 2: head wo · axial · 0.45mm/px · z∈[-369,-239]mm · 8 of 32 slices shown, 10 images]
[im 3/32  brain]
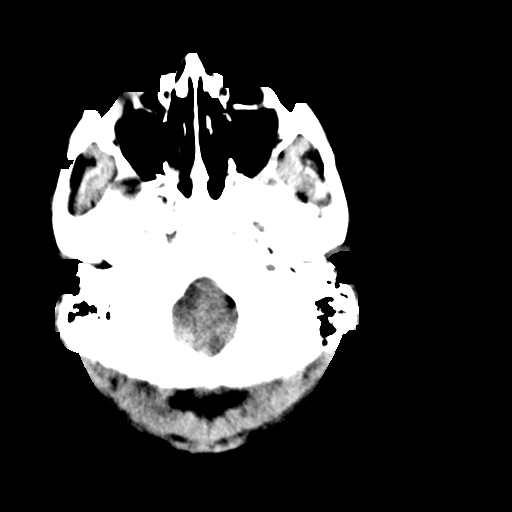
[im 3/32  bone]
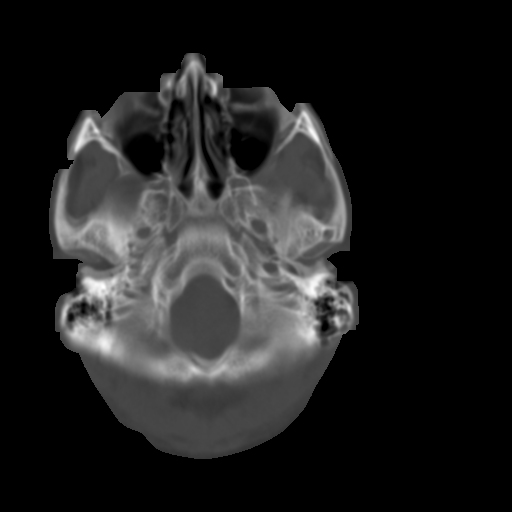
[im 7/32  brain]
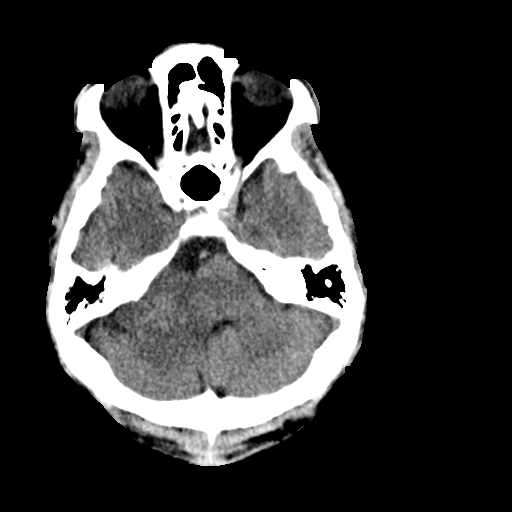
[im 10/32  brain]
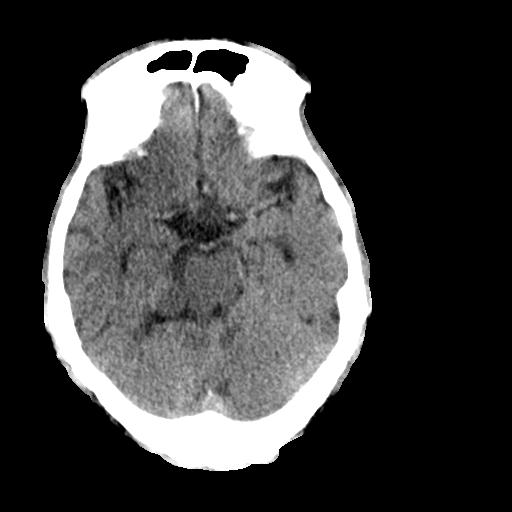
[im 14/32  brain]
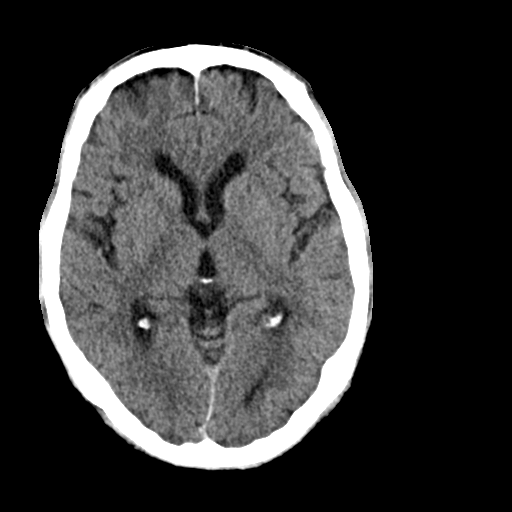
[im 18/32  brain]
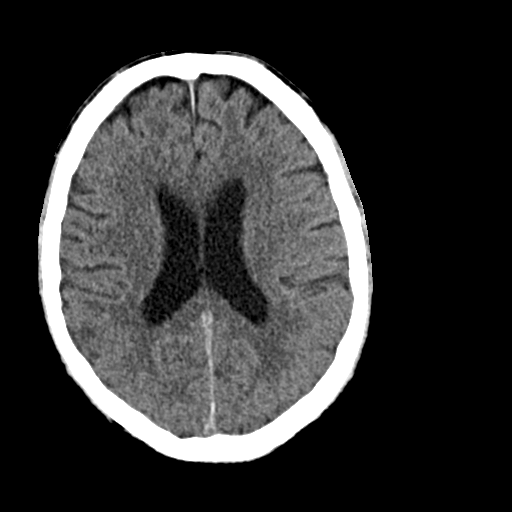
[im 18/32  bone]
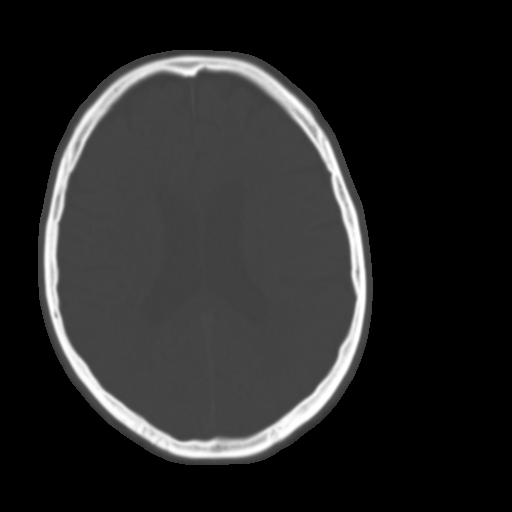
[im 22/32  brain]
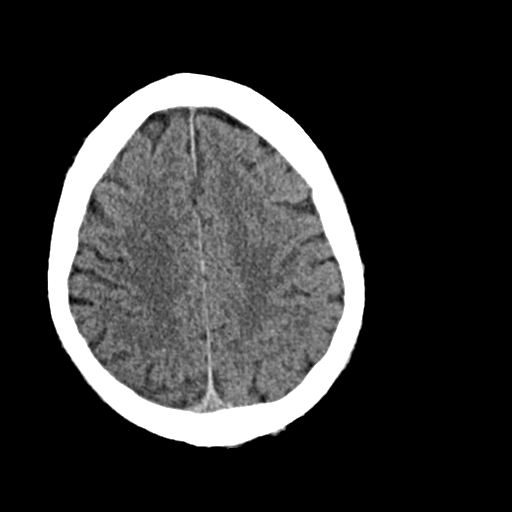
[im 25/32  brain]
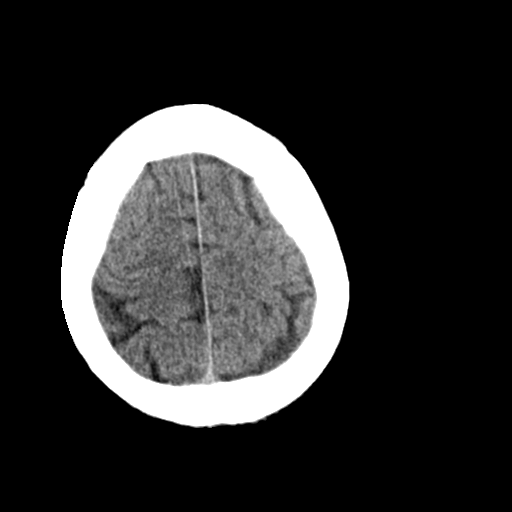
[im 29/32  brain]
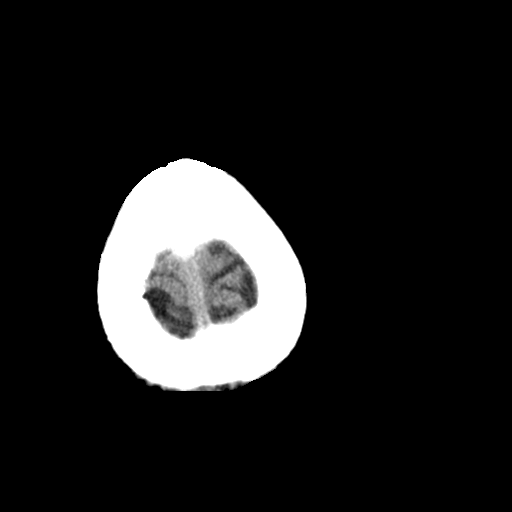

[Series 4: coronal soft · coronal · 0.31mm/px · 3 of 67 slices shown]
[im 23/67  brain]
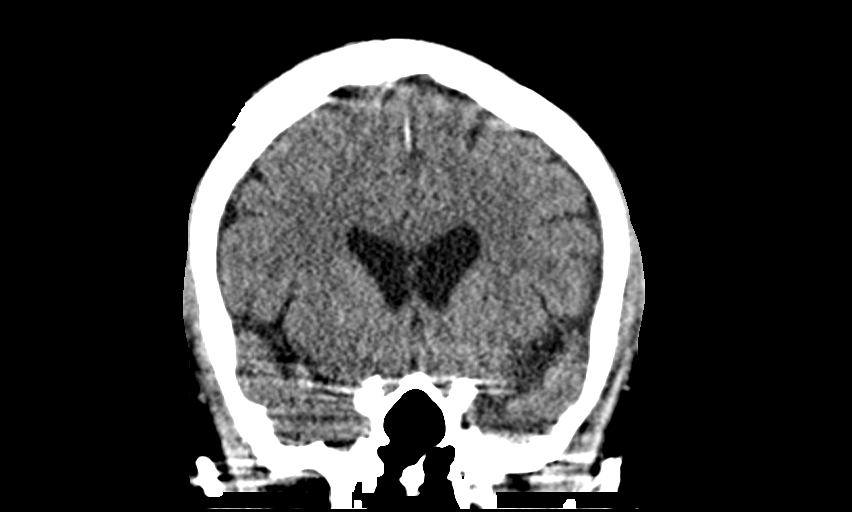
[im 30/67  brain]
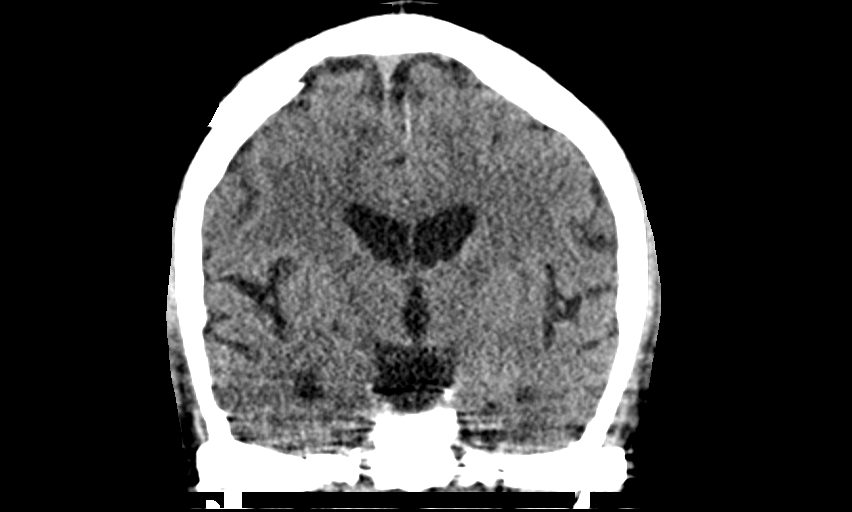
[im 37/67  brain]
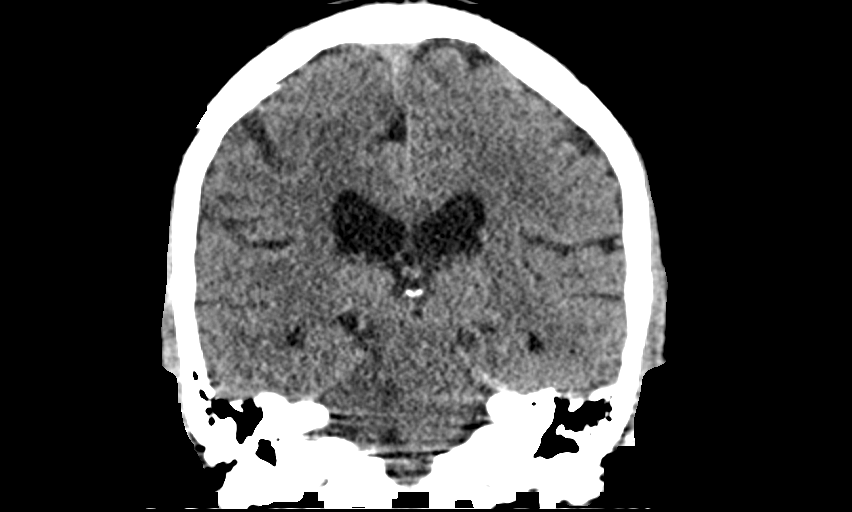

[Series 5: sag soft · sagittal · 0.31mm/px · 3 of 67 slices shown]
[im 23/67  brain]
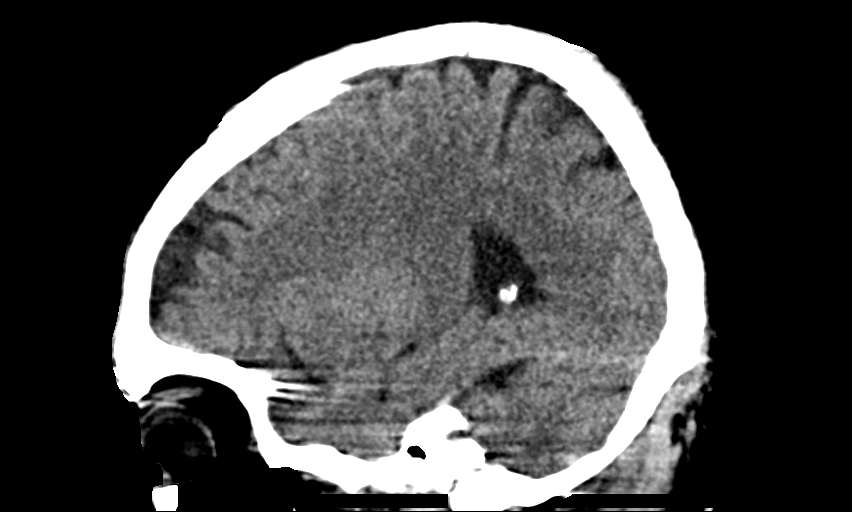
[im 34/67  brain]
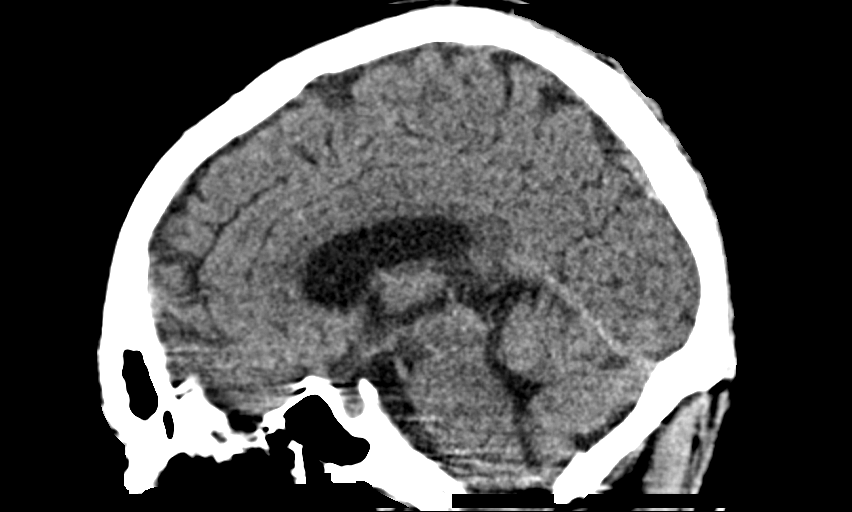
[im 45/67  brain]
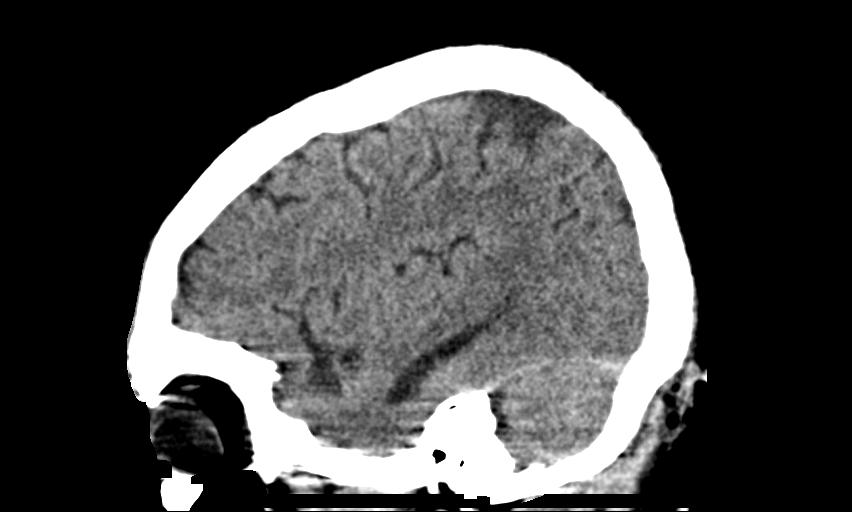

[14 of 47 positions shown; findings below may reference images not displayed]

FINDINGS: Brain: No evidence of acute infarction, hemorrhage, hydrocephalus,
extra-axial collection or mass lesion/mass effect.

Vascular: No hyperdense vessel or unexpected calcification.

Skull: Normal. Negative for fracture or focal lesion.

Sinuses/Orbits: No acute finding.

Other: None.
IMPRESSION: No acute intracranial findings.

## 2017-10-26 DIAGNOSIS — Z87891 Personal history of nicotine dependence: Secondary | ICD-10-CM | POA: Diagnosis not present

## 2017-10-26 DIAGNOSIS — E119 Type 2 diabetes mellitus without complications: Secondary | ICD-10-CM | POA: Diagnosis not present

## 2017-10-26 DIAGNOSIS — E039 Hypothyroidism, unspecified: Secondary | ICD-10-CM | POA: Diagnosis not present

## 2017-10-26 DIAGNOSIS — Z7982 Long term (current) use of aspirin: Secondary | ICD-10-CM | POA: Diagnosis not present

## 2017-10-26 DIAGNOSIS — K08109 Complete loss of teeth, unspecified cause, unspecified class: Secondary | ICD-10-CM | POA: Diagnosis not present

## 2017-10-26 DIAGNOSIS — Z7984 Long term (current) use of oral hypoglycemic drugs: Secondary | ICD-10-CM | POA: Diagnosis not present

## 2017-10-26 DIAGNOSIS — Z833 Family history of diabetes mellitus: Secondary | ICD-10-CM | POA: Diagnosis not present

## 2017-10-26 DIAGNOSIS — I10 Essential (primary) hypertension: Secondary | ICD-10-CM | POA: Diagnosis not present

## 2017-10-26 DIAGNOSIS — Z8249 Family history of ischemic heart disease and other diseases of the circulatory system: Secondary | ICD-10-CM | POA: Diagnosis not present

## 2017-11-06 DIAGNOSIS — E559 Vitamin D deficiency, unspecified: Secondary | ICD-10-CM | POA: Diagnosis not present

## 2017-11-06 DIAGNOSIS — M109 Gout, unspecified: Secondary | ICD-10-CM | POA: Diagnosis not present

## 2017-11-06 DIAGNOSIS — R69 Illness, unspecified: Secondary | ICD-10-CM | POA: Diagnosis not present

## 2017-11-06 DIAGNOSIS — E119 Type 2 diabetes mellitus without complications: Secondary | ICD-10-CM | POA: Diagnosis not present

## 2017-11-06 DIAGNOSIS — E039 Hypothyroidism, unspecified: Secondary | ICD-10-CM | POA: Diagnosis not present

## 2017-11-06 DIAGNOSIS — N183 Chronic kidney disease, stage 3 (moderate): Secondary | ICD-10-CM | POA: Diagnosis not present

## 2017-11-06 DIAGNOSIS — I10 Essential (primary) hypertension: Secondary | ICD-10-CM | POA: Diagnosis not present

## 2017-11-06 DIAGNOSIS — I119 Hypertensive heart disease without heart failure: Secondary | ICD-10-CM | POA: Diagnosis not present

## 2017-11-22 DIAGNOSIS — R809 Proteinuria, unspecified: Secondary | ICD-10-CM | POA: Diagnosis not present

## 2017-11-22 DIAGNOSIS — E119 Type 2 diabetes mellitus without complications: Secondary | ICD-10-CM | POA: Diagnosis not present

## 2017-11-22 DIAGNOSIS — N390 Urinary tract infection, site not specified: Secondary | ICD-10-CM | POA: Diagnosis not present

## 2017-11-22 DIAGNOSIS — I10 Essential (primary) hypertension: Secondary | ICD-10-CM | POA: Diagnosis not present

## 2017-11-29 DIAGNOSIS — N1339 Other hydronephrosis: Secondary | ICD-10-CM | POA: Diagnosis not present

## 2017-11-29 DIAGNOSIS — N1330 Unspecified hydronephrosis: Secondary | ICD-10-CM | POA: Diagnosis not present

## 2017-11-29 DIAGNOSIS — E875 Hyperkalemia: Secondary | ICD-10-CM | POA: Diagnosis not present

## 2017-11-29 DIAGNOSIS — N289 Disorder of kidney and ureter, unspecified: Secondary | ICD-10-CM | POA: Diagnosis not present

## 2017-11-29 DIAGNOSIS — I159 Secondary hypertension, unspecified: Secondary | ICD-10-CM | POA: Diagnosis not present

## 2017-12-01 DIAGNOSIS — R358 Other polyuria: Secondary | ICD-10-CM | POA: Diagnosis not present

## 2017-12-01 DIAGNOSIS — N401 Enlarged prostate with lower urinary tract symptoms: Secondary | ICD-10-CM | POA: Diagnosis not present

## 2017-12-01 DIAGNOSIS — R339 Retention of urine, unspecified: Secondary | ICD-10-CM | POA: Diagnosis not present

## 2017-12-01 DIAGNOSIS — N1330 Unspecified hydronephrosis: Secondary | ICD-10-CM | POA: Diagnosis not present

## 2017-12-01 DIAGNOSIS — R300 Dysuria: Secondary | ICD-10-CM | POA: Diagnosis not present

## 2017-12-01 DIAGNOSIS — N138 Other obstructive and reflux uropathy: Secondary | ICD-10-CM | POA: Diagnosis not present

## 2017-12-12 DIAGNOSIS — N35914 Unspecified anterior urethral stricture, male: Secondary | ICD-10-CM | POA: Diagnosis not present

## 2017-12-15 DIAGNOSIS — H6122 Impacted cerumen, left ear: Secondary | ICD-10-CM | POA: Diagnosis not present

## 2017-12-15 DIAGNOSIS — H90A21 Sensorineural hearing loss, unilateral, right ear, with restricted hearing on the contralateral side: Secondary | ICD-10-CM | POA: Diagnosis not present

## 2017-12-15 DIAGNOSIS — H903 Sensorineural hearing loss, bilateral: Secondary | ICD-10-CM | POA: Diagnosis not present

## 2017-12-20 DIAGNOSIS — N183 Chronic kidney disease, stage 3 (moderate): Secondary | ICD-10-CM | POA: Diagnosis not present

## 2017-12-20 DIAGNOSIS — I10 Essential (primary) hypertension: Secondary | ICD-10-CM | POA: Diagnosis not present

## 2017-12-20 DIAGNOSIS — N390 Urinary tract infection, site not specified: Secondary | ICD-10-CM | POA: Diagnosis not present

## 2017-12-20 DIAGNOSIS — E119 Type 2 diabetes mellitus without complications: Secondary | ICD-10-CM | POA: Diagnosis not present

## 2017-12-25 DIAGNOSIS — N138 Other obstructive and reflux uropathy: Secondary | ICD-10-CM | POA: Diagnosis not present

## 2017-12-25 DIAGNOSIS — N401 Enlarged prostate with lower urinary tract symptoms: Secondary | ICD-10-CM | POA: Diagnosis not present

## 2017-12-26 DIAGNOSIS — N289 Disorder of kidney and ureter, unspecified: Secondary | ICD-10-CM | POA: Diagnosis not present

## 2017-12-26 DIAGNOSIS — I10 Essential (primary) hypertension: Secondary | ICD-10-CM | POA: Diagnosis not present

## 2017-12-26 DIAGNOSIS — Q540 Hypospadias, balanic: Secondary | ICD-10-CM | POA: Diagnosis not present

## 2017-12-26 DIAGNOSIS — E119 Type 2 diabetes mellitus without complications: Secondary | ICD-10-CM | POA: Diagnosis not present

## 2017-12-26 DIAGNOSIS — N35811 Other urethral stricture, male, meatal: Secondary | ICD-10-CM | POA: Diagnosis not present

## 2017-12-26 DIAGNOSIS — N136 Pyonephrosis: Secondary | ICD-10-CM | POA: Diagnosis not present

## 2017-12-26 DIAGNOSIS — R590 Localized enlarged lymph nodes: Secondary | ICD-10-CM | POA: Diagnosis not present

## 2017-12-26 DIAGNOSIS — K869 Disease of pancreas, unspecified: Secondary | ICD-10-CM | POA: Diagnosis not present

## 2017-12-26 DIAGNOSIS — N390 Urinary tract infection, site not specified: Secondary | ICD-10-CM | POA: Diagnosis not present

## 2017-12-26 DIAGNOSIS — N323 Diverticulum of bladder: Secondary | ICD-10-CM | POA: Diagnosis not present

## 2017-12-26 DIAGNOSIS — R319 Hematuria, unspecified: Secondary | ICD-10-CM | POA: Diagnosis not present

## 2017-12-26 DIAGNOSIS — N132 Hydronephrosis with renal and ureteral calculous obstruction: Secondary | ICD-10-CM | POA: Diagnosis not present

## 2017-12-26 DIAGNOSIS — N12 Tubulo-interstitial nephritis, not specified as acute or chronic: Secondary | ICD-10-CM | POA: Diagnosis not present

## 2017-12-26 DIAGNOSIS — K8689 Other specified diseases of pancreas: Secondary | ICD-10-CM | POA: Diagnosis not present

## 2017-12-26 DIAGNOSIS — N1330 Unspecified hydronephrosis: Secondary | ICD-10-CM | POA: Diagnosis not present

## 2017-12-26 DIAGNOSIS — N400 Enlarged prostate without lower urinary tract symptoms: Secondary | ICD-10-CM | POA: Diagnosis not present

## 2017-12-26 DIAGNOSIS — N3001 Acute cystitis with hematuria: Secondary | ICD-10-CM | POA: Diagnosis not present

## 2017-12-26 DIAGNOSIS — K862 Cyst of pancreas: Secondary | ICD-10-CM | POA: Diagnosis not present

## 2017-12-26 DIAGNOSIS — N179 Acute kidney failure, unspecified: Secondary | ICD-10-CM | POA: Diagnosis not present

## 2017-12-26 DIAGNOSIS — N401 Enlarged prostate with lower urinary tract symptoms: Secondary | ICD-10-CM | POA: Diagnosis not present

## 2017-12-26 DIAGNOSIS — N138 Other obstructive and reflux uropathy: Secondary | ICD-10-CM | POA: Diagnosis not present

## 2018-01-01 DIAGNOSIS — N138 Other obstructive and reflux uropathy: Secondary | ICD-10-CM | POA: Diagnosis not present

## 2018-01-01 DIAGNOSIS — N401 Enlarged prostate with lower urinary tract symptoms: Secondary | ICD-10-CM | POA: Diagnosis not present

## 2018-01-05 DIAGNOSIS — N138 Other obstructive and reflux uropathy: Secondary | ICD-10-CM | POA: Diagnosis not present

## 2018-01-05 DIAGNOSIS — N401 Enlarged prostate with lower urinary tract symptoms: Secondary | ICD-10-CM | POA: Diagnosis not present

## 2018-01-08 DIAGNOSIS — Z01810 Encounter for preprocedural cardiovascular examination: Secondary | ICD-10-CM | POA: Diagnosis not present

## 2018-01-08 DIAGNOSIS — N12 Tubulo-interstitial nephritis, not specified as acute or chronic: Secondary | ICD-10-CM | POA: Diagnosis not present

## 2018-01-10 DIAGNOSIS — I10 Essential (primary) hypertension: Secondary | ICD-10-CM | POA: Diagnosis not present

## 2018-01-10 DIAGNOSIS — N400 Enlarged prostate without lower urinary tract symptoms: Secondary | ICD-10-CM | POA: Diagnosis not present

## 2018-01-10 DIAGNOSIS — Z7984 Long term (current) use of oral hypoglycemic drugs: Secondary | ICD-10-CM | POA: Diagnosis not present

## 2018-01-10 DIAGNOSIS — E1169 Type 2 diabetes mellitus with other specified complication: Secondary | ICD-10-CM | POA: Diagnosis not present

## 2018-01-10 DIAGNOSIS — R339 Retention of urine, unspecified: Secondary | ICD-10-CM | POA: Diagnosis not present

## 2018-01-10 DIAGNOSIS — N131 Hydronephrosis with ureteral stricture, not elsewhere classified: Secondary | ICD-10-CM | POA: Diagnosis not present

## 2018-01-10 DIAGNOSIS — N135 Crossing vessel and stricture of ureter without hydronephrosis: Secondary | ICD-10-CM | POA: Diagnosis not present

## 2018-01-10 DIAGNOSIS — N138 Other obstructive and reflux uropathy: Secondary | ICD-10-CM | POA: Diagnosis not present

## 2018-01-10 DIAGNOSIS — N401 Enlarged prostate with lower urinary tract symptoms: Secondary | ICD-10-CM | POA: Diagnosis not present

## 2018-01-10 DIAGNOSIS — R338 Other retention of urine: Secondary | ICD-10-CM | POA: Diagnosis not present

## 2018-01-10 DIAGNOSIS — R319 Hematuria, unspecified: Secondary | ICD-10-CM | POA: Diagnosis not present

## 2018-01-10 DIAGNOSIS — Z87891 Personal history of nicotine dependence: Secondary | ICD-10-CM | POA: Diagnosis not present

## 2018-01-10 DIAGNOSIS — N318 Other neuromuscular dysfunction of bladder: Secondary | ICD-10-CM | POA: Diagnosis not present

## 2018-01-16 DIAGNOSIS — N138 Other obstructive and reflux uropathy: Secondary | ICD-10-CM | POA: Diagnosis not present

## 2018-01-16 DIAGNOSIS — N401 Enlarged prostate with lower urinary tract symptoms: Secondary | ICD-10-CM | POA: Diagnosis not present

## 2018-01-30 DIAGNOSIS — T191XXA Foreign body in bladder, initial encounter: Secondary | ICD-10-CM | POA: Diagnosis not present

## 2018-02-01 DIAGNOSIS — E119 Type 2 diabetes mellitus without complications: Secondary | ICD-10-CM | POA: Diagnosis not present

## 2018-02-01 DIAGNOSIS — N390 Urinary tract infection, site not specified: Secondary | ICD-10-CM | POA: Diagnosis not present

## 2018-02-01 DIAGNOSIS — I10 Essential (primary) hypertension: Secondary | ICD-10-CM | POA: Diagnosis not present

## 2018-02-15 DIAGNOSIS — N401 Enlarged prostate with lower urinary tract symptoms: Secondary | ICD-10-CM | POA: Diagnosis not present

## 2018-02-15 DIAGNOSIS — N138 Other obstructive and reflux uropathy: Secondary | ICD-10-CM | POA: Diagnosis not present

## 2018-02-19 DIAGNOSIS — H6992 Unspecified Eustachian tube disorder, left ear: Secondary | ICD-10-CM | POA: Diagnosis not present

## 2018-02-19 DIAGNOSIS — H6121 Impacted cerumen, right ear: Secondary | ICD-10-CM | POA: Diagnosis not present

## 2018-02-20 DIAGNOSIS — N183 Chronic kidney disease, stage 3 (moderate): Secondary | ICD-10-CM | POA: Diagnosis not present

## 2018-02-26 DIAGNOSIS — E119 Type 2 diabetes mellitus without complications: Secondary | ICD-10-CM | POA: Diagnosis not present

## 2018-02-26 DIAGNOSIS — E559 Vitamin D deficiency, unspecified: Secondary | ICD-10-CM | POA: Diagnosis not present

## 2018-02-26 DIAGNOSIS — Z0001 Encounter for general adult medical examination with abnormal findings: Secondary | ICD-10-CM | POA: Diagnosis not present

## 2018-02-26 DIAGNOSIS — D696 Thrombocytopenia, unspecified: Secondary | ICD-10-CM | POA: Diagnosis not present

## 2018-02-26 DIAGNOSIS — I10 Essential (primary) hypertension: Secondary | ICD-10-CM | POA: Diagnosis not present

## 2018-02-26 DIAGNOSIS — M109 Gout, unspecified: Secondary | ICD-10-CM | POA: Diagnosis not present

## 2018-02-26 DIAGNOSIS — E039 Hypothyroidism, unspecified: Secondary | ICD-10-CM | POA: Diagnosis not present

## 2018-02-26 DIAGNOSIS — I119 Hypertensive heart disease without heart failure: Secondary | ICD-10-CM | POA: Diagnosis not present

## 2018-02-26 DIAGNOSIS — R69 Illness, unspecified: Secondary | ICD-10-CM | POA: Diagnosis not present

## 2018-03-13 DIAGNOSIS — H2513 Age-related nuclear cataract, bilateral: Secondary | ICD-10-CM | POA: Diagnosis not present

## 2018-03-13 DIAGNOSIS — H524 Presbyopia: Secondary | ICD-10-CM | POA: Diagnosis not present

## 2018-04-03 DIAGNOSIS — N390 Urinary tract infection, site not specified: Secondary | ICD-10-CM | POA: Diagnosis not present

## 2018-04-03 DIAGNOSIS — E119 Type 2 diabetes mellitus without complications: Secondary | ICD-10-CM | POA: Diagnosis not present

## 2018-04-03 DIAGNOSIS — N183 Chronic kidney disease, stage 3 (moderate): Secondary | ICD-10-CM | POA: Diagnosis not present

## 2018-04-03 DIAGNOSIS — I10 Essential (primary) hypertension: Secondary | ICD-10-CM | POA: Diagnosis not present

## 2018-04-06 DIAGNOSIS — R69 Illness, unspecified: Secondary | ICD-10-CM | POA: Diagnosis not present

## 2018-04-07 DIAGNOSIS — R69 Illness, unspecified: Secondary | ICD-10-CM | POA: Diagnosis not present

## 2018-04-11 DIAGNOSIS — N390 Urinary tract infection, site not specified: Secondary | ICD-10-CM | POA: Diagnosis not present

## 2018-04-12 DIAGNOSIS — N289 Disorder of kidney and ureter, unspecified: Secondary | ICD-10-CM | POA: Diagnosis not present

## 2018-04-12 DIAGNOSIS — R339 Retention of urine, unspecified: Secondary | ICD-10-CM | POA: Diagnosis not present

## 2018-05-07 DIAGNOSIS — R69 Illness, unspecified: Secondary | ICD-10-CM | POA: Diagnosis not present

## 2018-07-02 DIAGNOSIS — E119 Type 2 diabetes mellitus without complications: Secondary | ICD-10-CM | POA: Diagnosis not present

## 2018-07-02 DIAGNOSIS — N183 Chronic kidney disease, stage 3 (moderate): Secondary | ICD-10-CM | POA: Diagnosis not present

## 2018-07-02 DIAGNOSIS — I10 Essential (primary) hypertension: Secondary | ICD-10-CM | POA: Diagnosis not present

## 2018-07-12 DIAGNOSIS — N401 Enlarged prostate with lower urinary tract symptoms: Secondary | ICD-10-CM | POA: Diagnosis not present

## 2018-07-12 DIAGNOSIS — N289 Disorder of kidney and ureter, unspecified: Secondary | ICD-10-CM | POA: Diagnosis not present

## 2018-07-12 DIAGNOSIS — N138 Other obstructive and reflux uropathy: Secondary | ICD-10-CM | POA: Diagnosis not present

## 2018-07-16 DIAGNOSIS — N390 Urinary tract infection, site not specified: Secondary | ICD-10-CM | POA: Diagnosis not present

## 2018-07-16 DIAGNOSIS — E119 Type 2 diabetes mellitus without complications: Secondary | ICD-10-CM | POA: Diagnosis not present

## 2018-07-16 DIAGNOSIS — I10 Essential (primary) hypertension: Secondary | ICD-10-CM | POA: Diagnosis not present

## 2018-09-18 DIAGNOSIS — D696 Thrombocytopenia, unspecified: Secondary | ICD-10-CM | POA: Diagnosis not present

## 2018-09-18 DIAGNOSIS — R69 Illness, unspecified: Secondary | ICD-10-CM | POA: Diagnosis not present

## 2018-09-18 DIAGNOSIS — I119 Hypertensive heart disease without heart failure: Secondary | ICD-10-CM | POA: Diagnosis not present

## 2018-09-18 DIAGNOSIS — E1165 Type 2 diabetes mellitus with hyperglycemia: Secondary | ICD-10-CM | POA: Diagnosis not present

## 2018-09-18 DIAGNOSIS — M109 Gout, unspecified: Secondary | ICD-10-CM | POA: Diagnosis not present

## 2018-09-18 DIAGNOSIS — I10 Essential (primary) hypertension: Secondary | ICD-10-CM | POA: Diagnosis not present

## 2018-09-18 DIAGNOSIS — E039 Hypothyroidism, unspecified: Secondary | ICD-10-CM | POA: Diagnosis not present

## 2018-09-18 DIAGNOSIS — E559 Vitamin D deficiency, unspecified: Secondary | ICD-10-CM | POA: Diagnosis not present

## 2018-10-08 DIAGNOSIS — M109 Gout, unspecified: Secondary | ICD-10-CM | POA: Diagnosis not present

## 2018-10-08 DIAGNOSIS — Z01118 Encounter for examination of ears and hearing with other abnormal findings: Secondary | ICD-10-CM | POA: Diagnosis not present

## 2018-10-08 DIAGNOSIS — Z125 Encounter for screening for malignant neoplasm of prostate: Secondary | ICD-10-CM | POA: Diagnosis not present

## 2018-10-08 DIAGNOSIS — I10 Essential (primary) hypertension: Secondary | ICD-10-CM | POA: Diagnosis not present

## 2018-10-08 DIAGNOSIS — R69 Illness, unspecified: Secondary | ICD-10-CM | POA: Diagnosis not present

## 2018-10-08 DIAGNOSIS — Z136 Encounter for screening for cardiovascular disorders: Secondary | ICD-10-CM | POA: Diagnosis not present

## 2018-10-08 DIAGNOSIS — E039 Hypothyroidism, unspecified: Secondary | ICD-10-CM | POA: Diagnosis not present

## 2018-10-08 DIAGNOSIS — Z1329 Encounter for screening for other suspected endocrine disorder: Secondary | ICD-10-CM | POA: Diagnosis not present

## 2018-10-08 DIAGNOSIS — E1165 Type 2 diabetes mellitus with hyperglycemia: Secondary | ICD-10-CM | POA: Diagnosis not present

## 2018-10-08 DIAGNOSIS — D696 Thrombocytopenia, unspecified: Secondary | ICD-10-CM | POA: Diagnosis not present

## 2018-10-08 DIAGNOSIS — E559 Vitamin D deficiency, unspecified: Secondary | ICD-10-CM | POA: Diagnosis not present

## 2018-10-08 DIAGNOSIS — Z0001 Encounter for general adult medical examination with abnormal findings: Secondary | ICD-10-CM | POA: Diagnosis not present

## 2018-10-08 DIAGNOSIS — I119 Hypertensive heart disease without heart failure: Secondary | ICD-10-CM | POA: Diagnosis not present

## 2018-10-30 DIAGNOSIS — N289 Disorder of kidney and ureter, unspecified: Secondary | ICD-10-CM | POA: Diagnosis not present

## 2018-10-30 DIAGNOSIS — R339 Retention of urine, unspecified: Secondary | ICD-10-CM | POA: Diagnosis not present

## 2018-11-19 DIAGNOSIS — E039 Hypothyroidism, unspecified: Secondary | ICD-10-CM | POA: Diagnosis not present

## 2018-11-19 DIAGNOSIS — E1165 Type 2 diabetes mellitus with hyperglycemia: Secondary | ICD-10-CM | POA: Diagnosis not present

## 2018-11-19 DIAGNOSIS — I10 Essential (primary) hypertension: Secondary | ICD-10-CM | POA: Diagnosis not present

## 2018-11-19 DIAGNOSIS — D696 Thrombocytopenia, unspecified: Secondary | ICD-10-CM | POA: Diagnosis not present

## 2018-11-19 DIAGNOSIS — M109 Gout, unspecified: Secondary | ICD-10-CM | POA: Diagnosis not present

## 2018-11-19 DIAGNOSIS — I119 Hypertensive heart disease without heart failure: Secondary | ICD-10-CM | POA: Diagnosis not present

## 2018-11-19 DIAGNOSIS — R69 Illness, unspecified: Secondary | ICD-10-CM | POA: Diagnosis not present

## 2019-01-25 DIAGNOSIS — E119 Type 2 diabetes mellitus without complications: Secondary | ICD-10-CM | POA: Diagnosis not present

## 2019-01-25 DIAGNOSIS — N183 Chronic kidney disease, stage 3 (moderate): Secondary | ICD-10-CM | POA: Diagnosis not present

## 2019-01-25 DIAGNOSIS — I10 Essential (primary) hypertension: Secondary | ICD-10-CM | POA: Diagnosis not present

## 2019-01-28 DIAGNOSIS — N390 Urinary tract infection, site not specified: Secondary | ICD-10-CM | POA: Diagnosis not present

## 2019-01-28 DIAGNOSIS — I10 Essential (primary) hypertension: Secondary | ICD-10-CM | POA: Diagnosis not present

## 2019-01-28 DIAGNOSIS — E119 Type 2 diabetes mellitus without complications: Secondary | ICD-10-CM | POA: Diagnosis not present

## 2019-01-31 DIAGNOSIS — N35912 Unspecified bulbous urethral stricture, male: Secondary | ICD-10-CM | POA: Diagnosis not present

## 2019-02-21 DIAGNOSIS — R69 Illness, unspecified: Secondary | ICD-10-CM | POA: Diagnosis not present

## 2019-02-27 DIAGNOSIS — I10 Essential (primary) hypertension: Secondary | ICD-10-CM | POA: Diagnosis not present

## 2019-02-27 DIAGNOSIS — E039 Hypothyroidism, unspecified: Secondary | ICD-10-CM | POA: Diagnosis not present

## 2019-02-27 DIAGNOSIS — Z0001 Encounter for general adult medical examination with abnormal findings: Secondary | ICD-10-CM | POA: Diagnosis not present

## 2019-02-27 DIAGNOSIS — D696 Thrombocytopenia, unspecified: Secondary | ICD-10-CM | POA: Diagnosis not present

## 2019-02-27 DIAGNOSIS — M109 Gout, unspecified: Secondary | ICD-10-CM | POA: Diagnosis not present

## 2019-02-27 DIAGNOSIS — I119 Hypertensive heart disease without heart failure: Secondary | ICD-10-CM | POA: Diagnosis not present

## 2019-02-27 DIAGNOSIS — R69 Illness, unspecified: Secondary | ICD-10-CM | POA: Diagnosis not present

## 2019-02-27 DIAGNOSIS — Z789 Other specified health status: Secondary | ICD-10-CM | POA: Diagnosis not present

## 2019-02-27 DIAGNOSIS — E1165 Type 2 diabetes mellitus with hyperglycemia: Secondary | ICD-10-CM | POA: Diagnosis not present

## 2019-03-18 DIAGNOSIS — M109 Gout, unspecified: Secondary | ICD-10-CM | POA: Diagnosis not present

## 2019-03-18 DIAGNOSIS — I10 Essential (primary) hypertension: Secondary | ICD-10-CM | POA: Diagnosis not present

## 2019-03-18 DIAGNOSIS — D696 Thrombocytopenia, unspecified: Secondary | ICD-10-CM | POA: Diagnosis not present

## 2019-03-18 DIAGNOSIS — E1165 Type 2 diabetes mellitus with hyperglycemia: Secondary | ICD-10-CM | POA: Diagnosis not present

## 2019-03-18 DIAGNOSIS — R69 Illness, unspecified: Secondary | ICD-10-CM | POA: Diagnosis not present

## 2019-03-18 DIAGNOSIS — E039 Hypothyroidism, unspecified: Secondary | ICD-10-CM | POA: Diagnosis not present

## 2019-03-18 DIAGNOSIS — I119 Hypertensive heart disease without heart failure: Secondary | ICD-10-CM | POA: Diagnosis not present

## 2019-03-26 DIAGNOSIS — H524 Presbyopia: Secondary | ICD-10-CM | POA: Diagnosis not present

## 2019-03-26 DIAGNOSIS — E119 Type 2 diabetes mellitus without complications: Secondary | ICD-10-CM | POA: Diagnosis not present

## 2019-05-07 DIAGNOSIS — N289 Disorder of kidney and ureter, unspecified: Secondary | ICD-10-CM | POA: Diagnosis not present

## 2019-05-13 DIAGNOSIS — Z789 Other specified health status: Secondary | ICD-10-CM | POA: Diagnosis not present

## 2019-05-13 DIAGNOSIS — I119 Hypertensive heart disease without heart failure: Secondary | ICD-10-CM | POA: Diagnosis not present

## 2019-05-13 DIAGNOSIS — I10 Essential (primary) hypertension: Secondary | ICD-10-CM | POA: Diagnosis not present

## 2019-05-13 DIAGNOSIS — R69 Illness, unspecified: Secondary | ICD-10-CM | POA: Diagnosis not present

## 2019-05-13 DIAGNOSIS — D696 Thrombocytopenia, unspecified: Secondary | ICD-10-CM | POA: Diagnosis not present

## 2019-05-13 DIAGNOSIS — M109 Gout, unspecified: Secondary | ICD-10-CM | POA: Diagnosis not present

## 2019-05-13 DIAGNOSIS — E039 Hypothyroidism, unspecified: Secondary | ICD-10-CM | POA: Diagnosis not present

## 2019-05-13 DIAGNOSIS — E1165 Type 2 diabetes mellitus with hyperglycemia: Secondary | ICD-10-CM | POA: Diagnosis not present

## 2019-05-25 ENCOUNTER — Other Ambulatory Visit: Payer: Self-pay

## 2019-05-25 ENCOUNTER — Encounter (HOSPITAL_BASED_OUTPATIENT_CLINIC_OR_DEPARTMENT_OTHER): Payer: Self-pay | Admitting: Emergency Medicine

## 2019-05-25 ENCOUNTER — Emergency Department (HOSPITAL_BASED_OUTPATIENT_CLINIC_OR_DEPARTMENT_OTHER): Payer: Medicare HMO

## 2019-05-25 ENCOUNTER — Inpatient Hospital Stay (HOSPITAL_BASED_OUTPATIENT_CLINIC_OR_DEPARTMENT_OTHER)
Admission: EM | Admit: 2019-05-25 | Discharge: 2019-05-29 | DRG: 871 | Disposition: A | Discharge status: Home / Self Care | Payer: Medicare HMO | Attending: Internal Medicine | Admitting: Internal Medicine

## 2019-05-25 DIAGNOSIS — A4189 Other specified sepsis: Principal | ICD-10-CM | POA: Diagnosis present

## 2019-05-25 DIAGNOSIS — N400 Enlarged prostate without lower urinary tract symptoms: Secondary | ICD-10-CM | POA: Diagnosis present

## 2019-05-25 DIAGNOSIS — E039 Hypothyroidism, unspecified: Secondary | ICD-10-CM | POA: Diagnosis present

## 2019-05-25 DIAGNOSIS — D696 Thrombocytopenia, unspecified: Secondary | ICD-10-CM | POA: Diagnosis present

## 2019-05-25 DIAGNOSIS — N179 Acute kidney failure, unspecified: Secondary | ICD-10-CM | POA: Diagnosis not present

## 2019-05-25 DIAGNOSIS — E876 Hypokalemia: Secondary | ICD-10-CM | POA: Diagnosis not present

## 2019-05-25 DIAGNOSIS — Z9181 History of falling: Secondary | ICD-10-CM

## 2019-05-25 DIAGNOSIS — J189 Pneumonia, unspecified organism: Secondary | ICD-10-CM

## 2019-05-25 DIAGNOSIS — R509 Fever, unspecified: Secondary | ICD-10-CM | POA: Diagnosis not present

## 2019-05-25 DIAGNOSIS — Z7984 Long term (current) use of oral hypoglycemic drugs: Secondary | ICD-10-CM | POA: Diagnosis not present

## 2019-05-25 DIAGNOSIS — R001 Bradycardia, unspecified: Secondary | ICD-10-CM | POA: Diagnosis present

## 2019-05-25 DIAGNOSIS — I10 Essential (primary) hypertension: Secondary | ICD-10-CM | POA: Diagnosis present

## 2019-05-25 DIAGNOSIS — Z833 Family history of diabetes mellitus: Secondary | ICD-10-CM

## 2019-05-25 DIAGNOSIS — R6511 Systemic inflammatory response syndrome (SIRS) of non-infectious origin with acute organ dysfunction: Secondary | ICD-10-CM | POA: Diagnosis not present

## 2019-05-25 DIAGNOSIS — E119 Type 2 diabetes mellitus without complications: Secondary | ICD-10-CM | POA: Diagnosis present

## 2019-05-25 DIAGNOSIS — U071 COVID-19: Secondary | ICD-10-CM

## 2019-05-25 DIAGNOSIS — R5381 Other malaise: Secondary | ICD-10-CM | POA: Diagnosis not present

## 2019-05-25 DIAGNOSIS — W19XXXA Unspecified fall, initial encounter: Secondary | ICD-10-CM | POA: Diagnosis not present

## 2019-05-25 DIAGNOSIS — R945 Abnormal results of liver function studies: Secondary | ICD-10-CM | POA: Diagnosis present

## 2019-05-25 DIAGNOSIS — Z79890 Hormone replacement therapy (postmenopausal): Secondary | ICD-10-CM | POA: Diagnosis not present

## 2019-05-25 DIAGNOSIS — Z79899 Other long term (current) drug therapy: Secondary | ICD-10-CM | POA: Diagnosis not present

## 2019-05-25 DIAGNOSIS — J9601 Acute respiratory failure with hypoxia: Secondary | ICD-10-CM | POA: Diagnosis not present

## 2019-05-25 DIAGNOSIS — N390 Urinary tract infection, site not specified: Secondary | ICD-10-CM | POA: Diagnosis not present

## 2019-05-25 DIAGNOSIS — R262 Difficulty in walking, not elsewhere classified: Secondary | ICD-10-CM | POA: Diagnosis not present

## 2019-05-25 DIAGNOSIS — J1289 Other viral pneumonia: Secondary | ICD-10-CM | POA: Diagnosis not present

## 2019-05-25 DIAGNOSIS — R6520 Severe sepsis without septic shock: Secondary | ICD-10-CM | POA: Diagnosis present

## 2019-05-25 DIAGNOSIS — Z7952 Long term (current) use of systemic steroids: Secondary | ICD-10-CM

## 2019-05-25 DIAGNOSIS — N170 Acute kidney failure with tubular necrosis: Secondary | ICD-10-CM | POA: Diagnosis present

## 2019-05-25 DIAGNOSIS — R6510 Systemic inflammatory response syndrome (SIRS) of non-infectious origin without acute organ dysfunction: Secondary | ICD-10-CM

## 2019-05-25 DIAGNOSIS — R05 Cough: Secondary | ICD-10-CM | POA: Diagnosis not present

## 2019-05-25 DIAGNOSIS — R0902 Hypoxemia: Secondary | ICD-10-CM | POA: Diagnosis not present

## 2019-05-25 DIAGNOSIS — Z7982 Long term (current) use of aspirin: Secondary | ICD-10-CM | POA: Diagnosis not present

## 2019-05-25 LAB — COMPREHENSIVE METABOLIC PANEL
ALT: 44 U/L (ref 0–44)
AST: 76 U/L — ABNORMAL HIGH (ref 15–41)
Albumin: 3.9 g/dL (ref 3.5–5.0)
Alkaline Phosphatase: 53 U/L (ref 38–126)
Anion gap: 13 (ref 5–15)
BUN: 54 mg/dL — ABNORMAL HIGH (ref 8–23)
CO2: 25 mmol/L (ref 22–32)
Calcium: 9 mg/dL (ref 8.9–10.3)
Chloride: 100 mmol/L (ref 98–111)
Creatinine, Ser: 2.06 mg/dL — ABNORMAL HIGH (ref 0.61–1.24)
GFR calc Af Amer: 35 mL/min — ABNORMAL LOW (ref >60)
GFR calc non Af Amer: 31 mL/min — ABNORMAL LOW (ref >60)
Glucose, Bld: 212 mg/dL — ABNORMAL HIGH (ref 70–99)
Potassium: 3.4 mmol/L — ABNORMAL LOW (ref 3.5–5.1)
Sodium: 138 mmol/L (ref 135–145)
Total Bilirubin: 1.4 mg/dL — ABNORMAL HIGH (ref 0.3–1.2)
Total Protein: 7.9 g/dL (ref 6.5–8.1)

## 2019-05-25 LAB — LACTATE DEHYDROGENASE: LDH: 261 U/L — ABNORMAL HIGH (ref 98–192)

## 2019-05-25 LAB — CBC WITH DIFFERENTIAL/PLATELET
Abs Immature Granulocytes: 0.02 10*3/uL (ref 0.00–0.07)
Basophils Absolute: 0 10*3/uL (ref 0.0–0.1)
Basophils Relative: 0 %
Eosinophils Absolute: 0 10*3/uL (ref 0.0–0.5)
Eosinophils Relative: 0 %
HCT: 43.6 % (ref 39.0–52.0)
Hemoglobin: 14.3 g/dL (ref 13.0–17.0)
Immature Granulocytes: 0 %
Lymphocytes Relative: 7 %
Lymphs Abs: 0.5 10*3/uL — ABNORMAL LOW (ref 0.7–4.0)
MCH: 28.9 pg (ref 26.0–34.0)
MCHC: 32.8 g/dL (ref 30.0–36.0)
MCV: 88.3 fL (ref 80.0–100.0)
Monocytes Absolute: 0.4 10*3/uL (ref 0.1–1.0)
Monocytes Relative: 6 %
Neutro Abs: 6.2 10*3/uL (ref 1.7–7.7)
Neutrophils Relative %: 87 %
Platelets: 92 10*3/uL — ABNORMAL LOW (ref 150–400)
RBC: 4.94 MIL/uL (ref 4.22–5.81)
RDW: 14.1 % (ref 11.5–15.5)
Smear Review: NORMAL
WBC: 7.2 10*3/uL (ref 4.0–10.5)
nRBC: 0 % (ref 0.0–0.2)

## 2019-05-25 LAB — TROPONIN I (HIGH SENSITIVITY)
Troponin I (High Sensitivity): 64 ng/L — ABNORMAL HIGH
Troponin I (High Sensitivity): 51 ng/L — ABNORMAL HIGH

## 2019-05-25 LAB — URINALYSIS, MICROSCOPIC (REFLEX)

## 2019-05-25 LAB — D-DIMER, QUANTITATIVE: D-Dimer, Quant: 1.35 ug/mL-FEU — ABNORMAL HIGH (ref 0.00–0.50)

## 2019-05-25 LAB — URINALYSIS, ROUTINE W REFLEX MICROSCOPIC
Bilirubin Urine: NEGATIVE
Glucose, UA: NEGATIVE mg/dL
Ketones, ur: NEGATIVE mg/dL
Nitrite: NEGATIVE
Protein, ur: 30 mg/dL — AB
Specific Gravity, Urine: 1.02 (ref 1.005–1.030)
pH: 5.5 (ref 5.0–8.0)

## 2019-05-25 LAB — LACTIC ACID, PLASMA: Lactic Acid, Venous: 1.7 mmol/L (ref 0.5–1.9)

## 2019-05-25 LAB — CREATININE, SERUM
Creatinine, Ser: 1.62 mg/dL — ABNORMAL HIGH (ref 0.61–1.24)
GFR calc Af Amer: 47 mL/min — ABNORMAL LOW
GFR calc non Af Amer: 41 mL/min — ABNORMAL LOW

## 2019-05-25 LAB — ABO/RH: ABO/RH(D): A POS

## 2019-05-25 LAB — C-REACTIVE PROTEIN: CRP: 27.7 mg/dL — ABNORMAL HIGH (ref <1.0)

## 2019-05-25 LAB — FERRITIN: Ferritin: 1373 ng/mL — ABNORMAL HIGH (ref 24–336)

## 2019-05-25 LAB — TRIGLYCERIDES: Triglycerides: 103 mg/dL (ref <150)

## 2019-05-25 LAB — PROCALCITONIN: Procalcitonin: 16.41 ng/mL

## 2019-05-25 LAB — SARS CORONAVIRUS 2 AG (30 MIN TAT): SARS Coronavirus 2 Ag: POSITIVE — AB

## 2019-05-25 LAB — FIBRINOGEN: Fibrinogen: 699 mg/dL — ABNORMAL HIGH (ref 210–475)

## 2019-05-25 MED ORDER — DEXAMETHASONE SODIUM PHOSPHATE 10 MG/ML IJ SOLN
6.0000 mg | INTRAMUSCULAR | Status: DC
  Administered 2019-05-26 – 2019-05-29 (×4): 6 mg via INTRAVENOUS
  Filled 2019-05-25 (×4): qty 1

## 2019-05-25 MED ORDER — SODIUM CHLORIDE 0.9 % IV SOLN
INTRAVENOUS | Status: DC | PRN
  Administered 2019-05-25: 1000 mL via INTRAVENOUS

## 2019-05-25 MED ORDER — SODIUM CHLORIDE 0.9 % IV SOLN
1000.0000 mL | INTRAVENOUS | Status: AC
  Administered 2019-05-25 – 2019-05-26 (×2): 1000 mL via INTRAVENOUS

## 2019-05-25 MED ORDER — DEXAMETHASONE SODIUM PHOSPHATE 10 MG/ML IJ SOLN
6.0000 mg | Freq: Once | INTRAMUSCULAR | Status: AC
  Administered 2019-05-25: 6 mg via INTRAVENOUS
  Filled 2019-05-25: qty 1

## 2019-05-25 MED ORDER — SODIUM CHLORIDE 0.9 % IV SOLN
200.0000 mg | Freq: Once | INTRAVENOUS | Status: AC
  Administered 2019-05-25: 200 mg via INTRAVENOUS
  Filled 2019-05-25: qty 40
  Filled 2019-05-25: qty 200

## 2019-05-25 MED ORDER — INSULIN ASPART 100 UNIT/ML ~~LOC~~ SOLN
0.0000 [IU] | Freq: Three times a day (TID) | SUBCUTANEOUS | Status: DC
  Administered 2019-05-26 – 2019-05-27 (×4): 3 [IU] via SUBCUTANEOUS
  Administered 2019-05-27: 1 [IU] via SUBCUTANEOUS
  Administered 2019-05-27: 18:00:00 7 [IU] via SUBCUTANEOUS
  Administered 2019-05-28: 18:00:00 5 [IU] via SUBCUTANEOUS
  Administered 2019-05-28: 1 [IU] via SUBCUTANEOUS
  Administered 2019-05-28: 2 [IU] via SUBCUTANEOUS
  Administered 2019-05-29: 1 [IU] via SUBCUTANEOUS
  Administered 2019-05-29: 12:00:00 2 [IU] via SUBCUTANEOUS

## 2019-05-25 MED ORDER — SODIUM CHLORIDE 0.9 % IV SOLN
1.0000 g | Freq: Once | INTRAVENOUS | Status: AC
  Administered 2019-05-25: 1 g via INTRAVENOUS
  Filled 2019-05-25: qty 10

## 2019-05-25 MED ORDER — SODIUM CHLORIDE 0.9 % IV SOLN
100.0000 mg | Freq: Every day | INTRAVENOUS | Status: AC
  Administered 2019-05-26 – 2019-05-29 (×4): 100 mg via INTRAVENOUS
  Filled 2019-05-25 (×4): qty 100
  Filled 2019-05-25: qty 20

## 2019-05-25 MED ORDER — SODIUM CHLORIDE 0.9 % IV BOLUS
500.0000 mL | Freq: Once | INTRAVENOUS | Status: AC
  Administered 2019-05-25: 500 mL via INTRAVENOUS

## 2019-05-25 MED ORDER — INSULIN ASPART 100 UNIT/ML ~~LOC~~ SOLN
0.0000 [IU] | Freq: Every day | SUBCUTANEOUS | Status: DC
  Administered 2019-05-26 – 2019-05-27 (×3): 3 [IU] via SUBCUTANEOUS
  Administered 2019-05-28: 2 [IU] via SUBCUTANEOUS

## 2019-05-25 MED ORDER — HEPARIN SODIUM (PORCINE) 5000 UNIT/ML IJ SOLN
5000.0000 [IU] | Freq: Three times a day (TID) | INTRAMUSCULAR | Status: AC
  Administered 2019-05-25 – 2019-05-26 (×3): 5000 [IU] via SUBCUTANEOUS
  Filled 2019-05-25 (×3): qty 1

## 2019-05-25 MED ORDER — SODIUM CHLORIDE 0.9 % IV SOLN
500.0000 mg | INTRAVENOUS | Status: DC
  Administered 2019-05-26 – 2019-05-27 (×2): 500 mg via INTRAVENOUS
  Filled 2019-05-25 (×2): qty 500

## 2019-05-25 MED ORDER — ACETAMINOPHEN 325 MG PO TABS
650.0000 mg | ORAL_TABLET | Freq: Once | ORAL | Status: AC
  Administered 2019-05-25: 09:00:00 650 mg via ORAL
  Filled 2019-05-25: qty 2

## 2019-05-25 MED ORDER — SODIUM CHLORIDE 0.9 % IV SOLN
2.0000 g | INTRAVENOUS | Status: DC
  Administered 2019-05-26 – 2019-05-29 (×4): 2 g via INTRAVENOUS
  Filled 2019-05-25 (×4): qty 2

## 2019-05-25 MED ORDER — SODIUM CHLORIDE 0.9 % IV SOLN
500.0000 mg | Freq: Once | INTRAVENOUS | Status: AC
  Administered 2019-05-25: 11:00:00 500 mg via INTRAVENOUS
  Filled 2019-05-25: qty 500

## 2019-05-25 NOTE — ED Notes (Signed)
Pharmacist at [facility] contacted about remdesivir. Will have it sent over.

## 2019-05-25 NOTE — ED Notes (Signed)
Patient asleep and vitals are stable

## 2019-05-25 NOTE — ED Notes (Signed)
Called [facility] regarding bed availability  Spoke to [place] who stated that they are still waiting for an available bed

## 2019-05-25 NOTE — ED Notes (Signed)
Update given to Blake Herman patient's daughter

## 2019-05-25 NOTE — ED Notes (Signed)
Spoke with daughter and updated on condition

## 2019-05-25 NOTE — ED Notes (Signed)
Colletta Maryland, pt's daughter, called and informed of ready bed. Will call when pt transports

## 2019-05-25 NOTE — Progress Notes (Signed)
Pt daughter Colletta Maryland updated on patient condition per patient request.

## 2019-05-25 NOTE — H&P (Signed)
TRH H&P    Patient Demographics:    Blake Herman, is a 73 y.o. male  MRN: [id number]  DOB - 04-26-1946  Admit Date - 05/25/2019  Referring MD/NP/PA: Aletta Edouard  Outpatient Primary MD for the patient is Benito Mccreedy, MD  Patient coming from:  [facility]  Chief complaint-  cough   HPI:    Blake Herman  is a 73 y.o. male,  w hypertension, dm2, thrombocytopenia, h/o ?L vestibular schwannoma presents with c/o cough w brown sputum for the past 2 days.  Pt notes slight fever, loose stool.  Pt denies alteration in sense of smell or taste , cp, palp, sob, n/v, abd pain, brbpr, black stool.   In ED,  T 102.5, P 73, R 28, Bp 117/53,  Pox 93% on RA Wt 81.3kg  Na 138, K 3.4,  Bun 54, Creatinne 2.06 Ast 76, Alt 44, Alk phos 53, T. Bili 1.4 Wbc 7.2, Hgb 14.3, Plt 92 D dimer 1.35 Procalcitonin 16.41 LDH 261 Ferritin 1,373 Tg 103 Crp 27.7 Urinalysis prot 30, wbc 6-10, bacteria many covid Ag positive  Lactic acid 1.7  CXR IMPRESSION: Possible mild opacification in the medial left base/retrocardiac region which may be due to atelectasis or infection. Subtle bibasilar interstitial prominence likely chronic.  Pt received remdesivir, dexamethasone 4m iv x1, rocephin 1gm iv x 1, and zithromax 5071miv x1 in ED  Pt will be admitted for covid -19, and CAP, and ? Acute lower uti, and Acute renal failure.     Review of systems:    In addition to the HPI above,    No Headache, No changes with Vision or hearing, No problems swallowing food or Liquids, No Chest pain,  No Abdominal pain, No Nausea or Vomiting, No Blood in stool or Urine, No dysuria, No new skin rashes or bruises, No new joints pains-aches,  No new weakness, tingling, numbness in any extremity, No recent weight gain or loss, No polyuria, polydypsia or polyphagia, No significant Mental Stressors.  All other  systems reviewed and are negative.    Past History of the following :    Past Medical History:  Diagnosis Date  . Diabetes mellitus without complication ([place]  . Hypertension   . [place]ocyt[place] ([place]      Past Surgical History:  Procedure Laterality Date  . CHOLECYSTECTOMY    . HERNIA REPAIR        Social History:      Social History   Tobacco Use  . Smoking status: Never Smoker  . Smokeless tobacco: Never Used  Substance Use Topics  . Alcohol use: No    Alcohol/week: 0.0 standard drinks       Family History :     Family History  Problem Relation Age of Onset  . Diabetes Mother       Home Medications:   Prior to Admission medications   Medication Sig Start Date End Date Taking? Authorizing Provider  acetaminophen (TYLENOL) 500 MG tablet Take 1,000 mg by mouth every 6 (six) hours  as needed for moderate pain.   Yes [provider]  aspirin 81 MG EC tablet Take 81 mg by mouth daily.  10/10/14  Yes [provider]  atenolol-chlorthalidone (TENORETIC) 50-25 MG per tablet Take 1 tablet by mouth Daily. 50-25 mg 06/25/11  Yes [provider]  cholecalciferol (VITAMIN D) 1000 units tablet Take 1,000 Units by mouth daily.   Yes [provider]  hydrochlorothiazide (MICROZIDE) 12.5 MG capsule Take 12.5 mg by mouth daily.   Yes [provider]  levothyroxine (SYNTHROID) 25 MCG tablet Take 25 mcg by mouth daily before breakfast.   Yes [provider]  losartan (COZAAR) 50 MG tablet Take 50 mg by mouth Daily. 06/22/11  Yes [provider]  metFORMIN (GLUCOPHAGE) 500 MG tablet Take 500 mg by mouth 2 (two) times daily. 05/24/16  Yes [provider]  tamsulosin (FLOMAX) 0.4 MG CAPS capsule Take 0.4 mg by mouth daily.   Yes [provider]  dexamethasone (DECADRON) 2 MG tablet Take 1 tablet (2 mg total) by mouth 2 (two) times daily. 05/29/16   Caren Griffins, MD  oseltamivir (TAMIFLU)  30 MG capsule Take 1 capsule (30 mg total) by mouth 2 (two) times daily. 05/29/16   Caren Griffins, MD     Allergies:    No Known Allergies   Physical Exam:   Vitals  Blood pressure 120/62, pulse 68, temperature 99.1 F (37.3 C), temperature source Oral, resp. rate (!) 24, height 5' 11"  (1.803 m), weight 81.3 kg, SpO2 95 %.  1.  General: axoxo3  2. Psychiatric: euthymic  3. Neurologic: nonfocal  4. HEENMT:  Anicteric, pupils 1.107m symmetric, direct, consensual , intact Slightly hard of hearing Neck: no jvd  5. Respiratory : Slight crackles left lung base, no wheezing  6. Cardiovascular : rrr s1, s2, no m/g/r  7. Gastrointestinal:  Abd: soft, nt, nd, +bs  8. Skin:  Ext: no c/c/e, no rash  9.Musculoskeletal:  Good ROM   Data Review:    CBC Recent Labs  Lab 05/25/19 0901  WBC 7.2  HGB 14.3  HCT 43.6  PLT 92*  MCV 88.3  MCH 28.9  MCHC 32.8  RDW 14.1  LYMPHSABS 0.5*  MONOABS 0.4  EOSABS 0.0  BASOSABS 0.0   ------------------------------------------------------------------------------------------------------------------  Results for orders placed or performed during the hospital encounter of 05/25/19 (from the past 48 hour(s))  Lactic acid, plasma     Status: None   Collection Time: 05/25/19  9:01 AM  Result Value Ref Range   Lactic Acid, Venous 1.7 0.5 - 1.9 mmol/L    Comment: [facility] [address] [id number] CBC WITH DIFFERENTIAL     Status: Abnormal   Collection Time: 05/25/19  9:01 AM  Result Value Ref Range   WBC 7.2 4.0 - 10.5 K/uL   RBC 4.94 4.22 - 5.81 MIL/uL   Hemoglobin 14.3 13.0 - 17.0 g/dL   HCT 43.6 39.0 - 52.0 %   MCV 88.3 80.0 - 100.0 fL   MCH 28.9 26.0 - 34.0 pg   MCHC 32.8 30.0 - 36.0 g/dL   RDW 14.1 11.5 - 15.5 %   Platelets 92 (L) 150 - 400 K/uL    Comment: REPEATED TO VERIFY PLATELET COUNT CONFIRMED BY SMEAR SPECIMEN CHECKED FOR CLOTS Immature Platelet Fraction may  be clinically indicated, consider ordering this additional test [id number]   nRBC 0.0 0.0 - 0.2 %   Neutrophils Relative %  87 %   Neutro Abs 6.2 1.7 - 7.7 K/uL   Lymphocytes Relative 7 %   Lymphs Abs 0.5 (L) 0.7 - 4.0 K/uL   Monocytes Relative 6 %   Monocytes Absolute 0.4 0.1 - 1.0 K/uL   Eosinophils Relative 0 %   Eosinophils Absolute 0.0 0.0 - 0.5 K/uL   Basophils Relative 0 %   Basophils Absolute 0.0 0.0 - 0.1 K/uL   WBC Morphology MORPHOLOGY UNREMARKABLE    RBC Morphology MORPHOLOGY UNREMARKABLE    Smear Review Normal platelet morphology    Immature Granulocytes 0 %   Abs Immature Granulocytes 0.02 0.00 - 0.07 K/uL    Comment: Performed at [facility], [address] [place], [place] [address]  D-dimer, quantitative     Status: Abnormal   Collection Time: 05/25/19  9:01 AM  Result Value Ref Range   D-Dimer, Quant 1.35 (H) 0.00 - 0.50 ug/mL-FEU    Comment: (NOTE) At the manufacturer cut-off of 0.50 ug/mL FEU, this assay has been documented to exclude PE with a sensitivity and negative predictive value of 97 to 99%.  At this time, this assay has not been approved by the FDA to exclude DVT/VTE. Results should be correlated with clinical presentation. Performed at [facility], [address] [place], [place] [address]   Procalcitonin     Status: None   Collection Time: 05/25/19  9:01 AM  Result Value Ref Range   Procalcitonin 16.41 ng/mL    Comment:        Interpretation: PCT >= 10 ng/mL: Important systemic inflammatory response, almost exclusively due to severe bacterial sepsis or septic shock. (NOTE)       Sepsis PCT Algorithm           Lower Respiratory Tract                                      Infection PCT Algorithm    ----------------------------     ----------------------------         PCT < 0.25 ng/mL                PCT < 0.10 ng/mL         Strongly encourage             Strongly discourage   discontinuation of antibiotics     initiation of antibiotics    ----------------------------     -----------------------------       PCT 0.25 - 0.50 ng/mL            PCT 0.10 - 0.25 ng/mL               OR       >80% decrease in PCT            Discourage initiation of                                            antibiotics      Encourage discontinuation           of antibiotics    ----------------------------     -----------------------------         PCT >= 0.50 ng/mL              PCT 0.26 -  0.50 ng/mL                AND       <80% decrease in PCT             Encourage initiation of                                             antibiotics       Encourage continuation           of antibiotics    ----------------------------     -----------------------------        PCT >= 0.50 ng/mL                  PCT > 0.50 ng/mL               AND         increase in PCT                  Strongly encourage                                      initiation of antibiotics    Strongly encourage escalation           of antibiotics                                     -----------------------------                                           PCT <= 0.25 ng/mL                                                 OR                                        > 80% decrease in PCT                                     Discontinue / Do not initiate                                             antibiotics Performed at [facility], [place] [address], [place], [place] [address]   Lactate dehydrogenase     Status: Abnormal   Collection Time: 05/25/19  9:01 AM  Result Value Ref Range   LDH 261 (H) 98 - 192 U/L    Comment: Performed at [facility], [place] [address], [place], [place] [address]  Ferritin     Status: Abnormal   Collection Time: 05/25/19  9:01 AM  Result Value Ref Range   Ferritin  1,373 (H) 24 - 336 ng/mL    Comment: Performed at [facility], [place] [address] [place], [place] [address]  Triglycerides     Status: None   Collection  Time: 05/25/19  9:01 AM  Result Value Ref Range   Triglycerides 103 <150 mg/dL    Comment: Performed at [facility] [address] [place], [place] [address]  Fibrinogen     Status: Abnormal   Collection Time: 05/25/19  9:01 AM  Result Value Ref Range   Fibrinogen 699 (H) 210 - 475 mg/dL    Comment: Performed at [facility] [address] [place], [place] [address]  C-reactive protein     Status: Abnormal   Collection Time: 05/25/19  9:01 AM  Result Value Ref Range   CRP 27.7 (H) <1.0 mg/dL    Comment: Performed at [facility] [address], [place], [place] [address]  Urinalysis, Routine w reflex microscopic     Status: Abnormal   Collection Time: 05/25/19  9:01 AM  Result Value Ref Range   Color, Urine YELLOW YELLOW   APPearance CLEAR CLEAR   Specific Gravity, Urine 1.020 1.005 - 1.030   pH 5.5 5.0 - 8.0   Glucose, UA NEGATIVE NEGATIVE mg/dL   Hgb urine dipstick MODERATE (A) NEGATIVE   Bilirubin Urine NEGATIVE NEGATIVE   Ketones, ur NEGATIVE NEGATIVE mg/dL   Protein, ur 30 (A) NEGATIVE mg/dL   Nitrite NEGATIVE NEGATIVE   Leukocytes,Ua MODERATE (A) NEGATIVE    Comment: Performed at [facility], [address] [place], [place] [address]  Comprehensive metabolic panel     Status: Abnormal   Collection Time: 05/25/19  9:01 AM  Result Value Ref Range   Sodium 138 135 - 145 mmol/L   Potassium 3.4 (L) 3.5 - 5.1 mmol/L   Chloride 100 98 - 111 mmol/L   CO2 25 22 - 32 mmol/L   Glucose, Bld 212 (H) 70 - 99 mg/dL   BUN 54 (H) 8 - 23 mg/dL   Creatinine, Ser 2.06 (H) 0.61 - 1.24 mg/dL   Calcium 9.0 8.9 - 10.3 mg/dL   Total Protein 7.9 6.5 - 8.1 g/dL   Albumin 3.9 3.5 - 5.0 g/dL   AST 76 (H) 15 - 41 U/L   ALT 44 0 - 44 U/L   Alkaline Phosphatase 53 38 - 126 U/L   Total Bilirubin 1.4 (H) 0.3 - 1.2 mg/dL   GFR calc non Af Amer 31 (L) >60 mL/min   GFR calc Af Amer 35 (L) >60 mL/min   Anion gap 13 5 - 15    Comment: Performed at [facility], [address] [place], [place] [address]  Urinalysis, Microscopic (reflex)     Status: Abnormal   Collection Time: 05/25/19  9:01 AM  Result Value Ref Range   RBC / HPF 0-5 0 - 5 RBC/hpf   WBC, UA 6-10 0 - 5 WBC/hpf   Bacteria, UA MANY (A) NONE SEEN   Squamous Epithelial / LPF 0-5 0 - 5    Comment: Performed at [facility], [address] [place], [place] [address]  SARS Coronavirus 2 Ag (30 min TAT) - Urine, Clean Catch     Status: Abnormal   Collection Time: 05/25/19  9:02 AM   Specimen: Urine, Clean Catch; Nasal Swab  Result Value Ref Range   SARS Coronavirus 2 Ag POSITIVE (A) NEGATIVE  Comment: RESULT CALLED TO, READ BACK BY AND VERIFIED WITH: CALLED TO C.REED RN AT [place] ON [phone] (NOTE) SARS-CoV-2 antigen PRESENT. Positive results indicate the presence of viral antigens, but clinical correlation with patient history and other diagnostic information is necessary to determine patient infection status.  Positive results do not rule out bacterial infection or co-infection  with other viruses. False positive results are rare but can occur, and confirmatory RT-PCR testing may be appropriate in some circumstances. The expected result is Negative. Fact Sheet for Patients: PodPark.tn Fact Sheet for Providers: GiftContent.is  This test is not yet approved or cleared by the [place] FDA and  has been authorized for detection and/or diagnosis of SARS-CoV-2 by FDA under an Emergency Use Authorization (EUA).  This EUA will remain in effect (meaning this test can be used) for the duration of  the COVID- 19 declaration under Section 564(b)(1) of the Act, 21 U.S.C. section 360bbb-3(b)(1), unless the authorization is terminated or revoked sooner. Performed at [facility], [address] [place], [place] [address]     Chemistries  Recent Labs  Lab 05/25/19 0901  NA 138  K 3.4*    CL 100  CO2 25  GLUCOSE 212*  BUN 54*  CREATININE 2.06*  CALCIUM 9.0  AST 76*  ALT 44  ALKPHOS 53  BILITOT 1.4*   ------------------------------------------------------------------------------------------------------------------  ------------------------------------------------------------------------------------------------------------------ GFR: Estimated Creatinine Clearance: 33 mL/min (A) (by C-G formula based on SCr of 2.06 mg/dL (H)). Liver Function Tests: Recent Labs  Lab 05/25/19 0901  AST 76*  ALT 44  ALKPHOS 53  BILITOT 1.4*  PROT 7.9  ALBUMIN 3.9   No results for input(s): LIPASE, AMYLASE in the last 168 hours. No results for input(s): AMMONIA in the last 168 hours. Coagulation Profile: No results for input(s): INR, PROTIME in the last 168 hours. Cardiac Enzymes: No results for input(s): CKTOTAL, CKMB, CKMBINDEX, TROPONINI in the last 168 hours. BNP (last 3 results) No results for input(s): PROBNP in the last 8760 hours. HbA1C: No results for input(s): HGBA1C in the last 72 hours. CBG: No results for input(s): GLUCAP in the last 168 hours. Lipid Profile: Recent Labs    05/25/19 0901  TRIG 103   Thyroid Function Tests: No results for input(s): TSH, T4TOTAL, FREET4, T3FREE, THYROIDAB in the last 72 hours. Anemia Panel: Recent Labs    05/25/19 0901  FERRITIN 1,373*    --------------------------------------------------------------------------------------------------------------- Urine analysis:    Component Value Date/Time   COLORURINE YELLOW 05/25/2019 0901   APPEARANCEUR CLEAR 05/25/2019 0901   LABSPEC 1.020 05/25/2019 0901   PHURINE 5.5 05/25/2019 0901   GLUCOSEU NEGATIVE 05/25/2019 0901   HGBUR MODERATE (A) 05/25/2019 0901   BILIRUBINUR NEGATIVE 05/25/2019 0901   KETONESUR NEGATIVE 05/25/2019 0901   PROTEINUR 30 (A) 05/25/2019 0901   NITRITE NEGATIVE 05/25/2019 0901   LEUKOCYTESUR MODERATE (A) 05/25/2019 0901      Imaging Results:     DG Chest Port 1 View  Result Date: 05/25/2019 CLINICAL DATA:  Cough and fever 3 days which shortness-of-breath and sore throat. EXAM: PORTABLE CHEST 1 VIEW COMPARISON:  05/27/2016 FINDINGS: Patient slightly rotated to the left. Subtle interstitial prominence over the lung bases likely chronic. Possible minimal opacification in the left retrocardiac region/medial left base. Cardiomediastinal silhouette and remainder the exam is unchanged. IMPRESSION: Possible mild opacification in the medial left base/retrocardiac region which may be due to atelectasis or infection. Subtle bibasilar interstitial prominence likely chronic. Electronically Signed   By: Quillian Quince  Derrel Nip M.D.   On: 05/25/2019 09:40  Ekg nsr at 80 ,nl axis,  No st-t changes c/w ischemia   Assessment & Plan:    Active Problems:   Diabetes ([place])   Essential hypertension, benign   COVID-19 virus infection   ARF (acute renal failure) (HCC)   CAP (community acquired pneumonia)   Abnormal liver function   Hypokalemia  Covid-19 infection Dexamethasone 19m iv qday Remdesivir pharmacy to dose  CAP Blood culture x2 Urine strep antigen Urine legionella antigen Rocephin 2gm iv qday Zithromax 5048miv qday  + D dimer VQ scan  ARF Hold Tenoretic Hold Hydrochlorothiazide Hold Losartan Hydrate with ns iv Check renal ultrasound Check cmp in am  Hypokalemia Replete Check cmp in am  Abnormal liver function Check acute hepatitis panel Check RUQ ultrasound Check cmp in am  Hypertension Hold Hydrochlorothiazide as above Hold Losartan as above Atenolol 5069mo qday Hydralazine 48m43m tid  Dm2 STOP Metformin due to ARF fsbs ac and qhs, ISS  Hypothyroidism Cont Levothyroxine  Bph Cont Flomax 0.4mg 67mqhs  DVT Prophylaxis-   Lovenox - SCDs   AM Labs Ordered, also please review Full Orders  Family Communication: Admission, patients condition and plan of care including tests being ordered have been discussed with  the patient who indicate understanding and agree with the plan and Code Status.  Code Status:  FULL CODE per patient, [facility] wife [facility]: Based on patients clinical presentation and evaluation of above clinical data, I have made determination that patient meets Inpatient criteria at this time. Pt will require iv abx, and iv dexamethasone, and iv remdesivir, and iv fluids,  Pt has high risk of clinical deterioration, pt will require >2 nites stay.    Time spent in minutes : 70    JamesJani Gravelon 05/25/2019 at 10:01 PM

## 2019-05-25 NOTE — ED Triage Notes (Signed)
Pt here after fall. No complaints, but does have a fever and old emesis stains on robe. Patient has hx of brain tumor and is slightly altered, but can answer basic questions. Wife number is [phone]

## 2019-05-25 NOTE — ED Provider Notes (Signed)
[facility] EMERGENCY DEPARTMENT Provider Note   CSN: [id number] Arrival date & time: 05/25/19  [id number]     History Chief Complaint  Patient presents with  . Fall  . Fever    Blake Herman is a 73 y.o. male.  He is brought in by EMS for evaluation of injuries after a fall.  Patient himself cannot recall the fall but he remembers being on the floor beside his bed.  EMS assisted him up.  He is complaining of a dry cough that is been going on for 3 to 4 days.  He has some evidence of some dried vomit on his shirt although does not recall any vomiting.  Denies any pain.  No headache neck pain back pain.  No chest pain or abdominal pain.  No urinary symptoms.  Lives with his wife.  The history is provided by the patient and the EMS personnel.  Fall This is a new problem. The current episode started 1 to 2 hours ago. The problem has not changed since onset.Pertinent negatives include no chest pain, no abdominal pain, no headaches and no shortness of breath. Nothing aggravates the symptoms. Nothing relieves the symptoms. He has tried nothing for the symptoms. The treatment provided no relief.  Fever Max temp prior to arrival:  102.5 Temp source:  Oral Severity:  Moderate Onset quality:  Unable to specify Timing:  Unable to specify Progression:  Unchanged Chronicity:  New Relieved by:  None tried Worsened by:  Nothing Ineffective treatments:  None tried Associated symptoms: confusion, cough and vomiting   Associated symptoms: no chest pain, no chills, no diarrhea, no dysuria, no headaches, no rash and no sore throat        Past Medical History:  Diagnosis Date  . Diabetes mellitus without complication ([place])   . Hypertension   . Thrombocytopenia ([place]) 06/30/2011    Patient Active Problem List   Diagnosis Date Noted  . Cough 05/28/2016  . Stroke-like symptoms 05/28/2016  . Facial droop 05/27/2016  . Pain in joint of left foot 11/14/2014  . Arthritis due to gout  11/14/2014  . Thrombocytopenia ([place]) 06/30/2011  . Diabetes ([place]) 06/10/2010  . Essential hypertension, benign 06/10/2010  . BACK PAIN 06/10/2010    Past Surgical History:  Procedure Laterality Date  . CHOLECYSTECTOMY    . HERNIA REPAIR         History reviewed. No pertinent family history.  Social History   Tobacco Use  . Smoking status: Never Smoker  . Smokeless tobacco: Never Used  Substance Use Topics  . Alcohol use: No    Alcohol/week: 0.0 standard drinks  . Drug use: Never    Home Medications Prior to Admission medications   Medication Sig Start Date End Date Taking? Authorizing Provider  aspirin 81 MG EC tablet TK 1 T PO QD 10/10/14  Yes [provider]  atenolol-chlorthalidone (TENORETIC) 50-25 MG per tablet Take 1 tablet by mouth Daily. 50-25 mg 06/25/11  Yes [provider]  cholecalciferol (VITAMIN D) 1000 units tablet Take 1,000 Units by mouth daily.   Yes [provider]  losartan (COZAAR) 50 MG tablet Take 50 mg by mouth Daily. 06/22/11  Yes [provider]  metFORMIN (GLUCOPHAGE) 500 MG tablet Take 500 mg by mouth 2 (two) times daily. 05/24/16  Yes [provider]  dexamethasone (DECADRON) 2 MG tablet Take 1 tablet (2 mg total) by mouth 2 (two) times daily. 05/29/16   Caren Griffins, MD  oseltamivir (TAMIFLU) 30  MG capsule Take 1 capsule (30 mg total) by mouth 2 (two) times daily. 05/29/16   Caren Griffins, MD    Allergies    Patient has no known allergies.  Review of Systems   Review of Systems  Constitutional: Positive for fever. Negative for chills.  HENT: Negative for sore throat.   Eyes: Negative for visual disturbance.  Respiratory: Positive for cough. Negative for shortness of breath.   Cardiovascular: Negative for chest pain.  Gastrointestinal: Positive for vomiting. Negative for abdominal pain and diarrhea.  Genitourinary: Negative for dysuria.  Musculoskeletal: Negative for neck pain.  Skin:  Negative for rash.  Neurological: Negative for headaches.  Psychiatric/Behavioral: Positive for confusion.    Physical Exam Updated Vital Signs BP (!) 117/53   Pulse 73   Temp (!) 102.5 F (39.2 C) (Oral)   Resp (!) 28   SpO2 93%   Physical Exam Vitals and nursing note reviewed.  Constitutional:      Appearance: He is well-developed.  HENT:     Head: Normocephalic and atraumatic.  Eyes:     Conjunctiva/sclera: Conjunctivae normal.  Cardiovascular:     Rate and Rhythm: Normal rate and regular rhythm.     Heart sounds: No murmur.  Pulmonary:     Effort: Pulmonary effort is normal. Tachypnea present. No respiratory distress.     Breath sounds: Normal breath sounds.  Abdominal:     Palpations: Abdomen is soft.     Tenderness: There is no abdominal tenderness.  Musculoskeletal:        General: No deformity or signs of injury. Normal range of motion.     Cervical back: Neck supple.  Skin:    General: Skin is warm and dry.     Capillary Refill: Capillary refill takes less than 2 seconds.  Neurological:     General: No focal deficit present.     Mental Status: He is alert.     ED Results / Procedures / Treatments   Labs (all labs ordered are listed, but only abnormal results are displayed) Labs Reviewed  SARS CORONAVIRUS 2 AG (30 MIN TAT) - Abnormal; Notable for the following components:      Result Value   SARS Coronavirus 2 Ag POSITIVE (*)    All other components within normal limits  CBC WITH DIFFERENTIAL/PLATELET - Abnormal; Notable for the following components:   Platelets 92 (*)    Lymphs Abs 0.5 (*)    All other components within normal limits  D-DIMER, QUANTITATIVE (NOT AT [facility]) - Abnormal; Notable for the following components:   D-Dimer, Quant 1.35 (*)    All other components within normal limits  LACTATE DEHYDROGENASE - Abnormal; Notable for the following components:   LDH 261 (*)    All other components within normal limits  FERRITIN - Abnormal; Notable  for the following components:   Ferritin 1,373 (*)    All other components within normal limits  FIBRINOGEN - Abnormal; Notable for the following components:   Fibrinogen 699 (*)    All other components within normal limits  C-REACTIVE PROTEIN - Abnormal; Notable for the following components:   CRP 27.7 (*)    All other components within normal limits  URINALYSIS, ROUTINE W REFLEX MICROSCOPIC - Abnormal; Notable for the following components:   Hgb urine dipstick MODERATE (*)    Protein, ur 30 (*)    Leukocytes,Ua MODERATE (*)    All other components within normal limits  COMPREHENSIVE METABOLIC PANEL - Abnormal; Notable for the  following components:   Potassium 3.4 (*)    Glucose, Bld 212 (*)    BUN 54 (*)    Creatinine, Ser 2.06 (*)    AST 76 (*)    Total Bilirubin 1.4 (*)    GFR calc non Af Amer 31 (*)    GFR calc Af Amer 35 (*)    All other components within normal limits  URINALYSIS, MICROSCOPIC (REFLEX) - Abnormal; Notable for the following components:   Bacteria, UA MANY (*)    All other components within normal limits  CULTURE, BLOOD (ROUTINE X 2)  CULTURE, BLOOD (ROUTINE X 2)  URINE CULTURE  LACTIC ACID, PLASMA  PROCALCITONIN  TRIGLYCERIDES    EKG EKG Interpretation  Date/Time:  Saturday May 25 2019 08:44:12 EST Ventricular Rate:  80 PR Interval:    QRS Duration: 77 QT Interval:  354 QTC Calculation: 409 R Axis:   5 Text Interpretation: Sinus rhythm No significant change since 12/17 Confirmed by Aletta Edouard [id number]) on 05/25/2019 8:47:36 AM   Radiology DG Chest Port 1 View  Result Date: 05/25/2019 CLINICAL DATA:  Cough and fever 3 days which shortness-of-breath and sore throat. EXAM: PORTABLE CHEST 1 VIEW COMPARISON:  05/27/2016 FINDINGS: Patient slightly rotated to the left. Subtle interstitial prominence over the lung bases likely chronic. Possible minimal opacification in the left retrocardiac region/medial left base. Cardiomediastinal silhouette  and remainder the exam is unchanged. IMPRESSION: Possible mild opacification in the medial left base/retrocardiac region which may be due to atelectasis or infection. Subtle bibasilar interstitial prominence likely chronic. Electronically Signed   By: Marin Olp M.D.   On: 05/25/2019 09:40    Procedures .Critical Care Performed by: Hayden Rasmussen, MD Authorized by: Hayden Rasmussen, MD   Critical care provider statement:    Critical care time (minutes):  45   Critical care time was exclusive of:  Separately billable procedures and treating other patients   Critical care was necessary to treat or prevent imminent or life-threatening deterioration of the following conditions:  Respiratory failure (sirs, covid, lethargy)   Critical care was time spent personally by me on the following activities:  Evaluation of patient's response to treatment, examination of patient, ordering and performing treatments and interventions, ordering and review of laboratory studies, ordering and review of radiographic studies, pulse oximetry, re-evaluation of patient's condition, obtaining history from patient or surrogate, review of old charts and development of treatment plan with patient or surrogate   I assumed direction of critical care for this patient from another provider in my specialty: no     (including critical care time)  Medications Ordered in ED Medications  0.9 %  sodium chloride infusion (0 mLs Intravenous Stopped 05/25/19 1045)  cefTRIAXone (ROCEPHIN) 1 g in sodium chloride 0.9 % 100 mL IVPB (0 g Intravenous Hold 05/25/19 1044)  0.9 %  sodium chloride infusion (1,000 mLs Intravenous New Bag/Given 05/25/19 1332)  sodium chloride 0.9 % bolus 500 mL (has no administration in time range)  acetaminophen (TYLENOL) tablet 650 mg (650 mg Oral Given 05/25/19 0915)  azithromycin (ZITHROMAX) 500 mg in sodium chloride 0.9 % 250 mL IVPB (500 mg Intravenous New Bag/Given 05/25/19 1044)  dexamethasone  (DECADRON) injection 6 mg (6 mg Intravenous Given 05/25/19 1104)    ED Course  I have reviewed the triage vital signs and the nursing notes.  Pertinent labs & imaging results that were available during my care of the patient were reviewed by me and considered in my medical decision making (  see chart for details).  Clinical Course as of May 24 1442  Sat May 24, 4541  1569 73 year old male here after mechanical fall.  Febrile here.  Differential includes Covid, pneumonia, urosepsis, intra-abdominal infection, sepsis.   [MB]  [phone] I called the patient's wife and she said he has been laying in bed for the past couple of days and had a cough.  She also said he has had a fever.  She thinks he might of try to get out of bed today and that is when she found him on the floor.   [MB]  0946 Chest x-ray interpreted by me as possible left lower lobe infiltrate.   [MB]  1004 Patient's labs started to come back.  His lactate was not elevated at 1.7.  D-dimer elevated at 1.35.  Normal white count although platelets are low, upon review of prior counts this is baseline.   [MB]  1005 Chest x-ray read as possible infiltrate so will cover with antibiotics.   [MB]  1010 Chemistry showing an elevated BUN and creatinine new from prior consistent with AKI.   [MB]  [id number] Patient's Covid test is positive.  Due to his AKI his generalized weakness and some confusion I think he should be admitted to the hospital.  I updated his wife and the patient regarding these results.   [MB]  [id number] Discussed with Dr. Sinclair Ship long hospitalist.  He has accepted the patient for admission to [facility] long campus.  Recommended Decadron here.  Also recommends to hold on antibiotics until procalcitonin results.  If elevated then give antibiotics.   [MB]  1320 Procalcitonin resulted as elevated so will give antibiotics for possible pneumonia.   [MB]    Clinical Course User Index [MB] Hayden Rasmussen, MD   MDM  Rules/Calculators/A&P                     Final Clinical Impression(s) / ED Diagnoses Final diagnoses:  Pneumonia of left lower lobe due to infectious organism  SIRS (systemic inflammatory response syndrome) ([place])  AKI (acute kidney injury) ([place])  COVID-19 virus infection    Rx / DC Orders ED Discharge Orders    None       Hayden Rasmussen, MD 05/25/19 1720

## 2019-05-26 ENCOUNTER — Inpatient Hospital Stay (HOSPITAL_COMMUNITY): Payer: Medicare HMO

## 2019-05-26 DIAGNOSIS — E876 Hypokalemia: Secondary | ICD-10-CM

## 2019-05-26 LAB — COMPREHENSIVE METABOLIC PANEL
ALT: 36 U/L (ref 0–44)
AST: 54 U/L — ABNORMAL HIGH (ref 15–41)
Albumin: 3.1 g/dL — ABNORMAL LOW (ref 3.5–5.0)
Alkaline Phosphatase: 40 U/L (ref 38–126)
Anion gap: 18 — ABNORMAL HIGH (ref 5–15)
BUN: 45 mg/dL — ABNORMAL HIGH (ref 8–23)
CO2: 20 mmol/L — ABNORMAL LOW (ref 22–32)
Calcium: 7.7 mg/dL — ABNORMAL LOW (ref 8.9–10.3)
Chloride: 102 mmol/L (ref 98–111)
Creatinine, Ser: 1.29 mg/dL — ABNORMAL HIGH (ref 0.61–1.24)
GFR calc Af Amer: >60 mL/min (ref >60)
GFR calc non Af Amer: 54 mL/min — ABNORMAL LOW (ref >60)
Glucose, Bld: 260 mg/dL — ABNORMAL HIGH (ref 70–99)
Potassium: 3.7 mmol/L (ref 3.5–5.1)
Sodium: 140 mmol/L (ref 135–145)
Total Bilirubin: 0.7 mg/dL (ref 0.3–1.2)
Total Protein: 6.7 g/dL (ref 6.5–8.1)

## 2019-05-26 LAB — CBC WITH DIFFERENTIAL/PLATELET
Abs Immature Granulocytes: 0.02 10*3/uL (ref 0.00–0.07)
Basophils Absolute: 0 10*3/uL (ref 0.0–0.1)
Basophils Relative: 0 %
Eosinophils Absolute: 0 10*3/uL (ref 0.0–0.5)
Eosinophils Relative: 0 %
HCT: 42.8 % (ref 39.0–52.0)
Hemoglobin: 14 g/dL (ref 13.0–17.0)
Immature Granulocytes: 0 %
Lymphocytes Relative: 11 %
Lymphs Abs: 0.9 10*3/uL (ref 0.7–4.0)
MCH: 29.1 pg (ref 26.0–34.0)
MCHC: 32.7 g/dL (ref 30.0–36.0)
MCV: 89 fL (ref 80.0–100.0)
Monocytes Absolute: 0.5 10*3/uL (ref 0.1–1.0)
Monocytes Relative: 6 %
Neutro Abs: 6.2 10*3/uL (ref 1.7–7.7)
Neutrophils Relative %: 83 %
Platelets: 96 10*3/uL — ABNORMAL LOW (ref 150–400)
RBC: 4.81 MIL/uL (ref 4.22–5.81)
RDW: 14.4 % (ref 11.5–15.5)
WBC: 7.6 10*3/uL (ref 4.0–10.5)
nRBC: 0 % (ref 0.0–0.2)

## 2019-05-26 LAB — HEPATITIS PANEL, ACUTE
HCV Ab: NONREACTIVE
Hep A IgM: NONREACTIVE
Hep B C IgM: NONREACTIVE
Hepatitis B Surface Ag: NONREACTIVE

## 2019-05-26 LAB — URINE CULTURE: Culture: <10000 — AB

## 2019-05-26 LAB — GLUCOSE, CAPILLARY
Glucose-Capillary: 214 mg/dL — ABNORMAL HIGH (ref 70–99)
Glucose-Capillary: 228 mg/dL — ABNORMAL HIGH (ref 70–99)
Glucose-Capillary: 231 mg/dL — ABNORMAL HIGH (ref 70–99)
Glucose-Capillary: 252 mg/dL — ABNORMAL HIGH (ref 70–99)
Glucose-Capillary: 282 mg/dL — ABNORMAL HIGH (ref 70–99)

## 2019-05-26 MED ORDER — TAMSULOSIN HCL 0.4 MG PO CAPS
0.4000 mg | ORAL_CAPSULE | Freq: Every day | ORAL | Status: DC
  Administered 2019-05-26 – 2019-05-29 (×4): 0.4 mg via ORAL
  Filled 2019-05-26 (×4): qty 1

## 2019-05-26 MED ORDER — LEVOTHYROXINE SODIUM 25 MCG PO TABS
25.0000 ug | ORAL_TABLET | Freq: Every day | ORAL | Status: DC
  Administered 2019-05-26 – 2019-05-29 (×4): 25 ug via ORAL
  Filled 2019-05-26 (×4): qty 1

## 2019-05-26 MED ORDER — SODIUM CHLORIDE 0.9 % IV SOLN
1000.0000 mL | INTRAVENOUS | Status: AC
  Administered 2019-05-26 – 2019-05-28 (×5): 1000 mL via INTRAVENOUS

## 2019-05-26 MED ORDER — HYDRALAZINE HCL 25 MG PO TABS
25.0000 mg | ORAL_TABLET | Freq: Three times a day (TID) | ORAL | Status: DC
  Administered 2019-05-26 – 2019-05-27 (×7): 25 mg via ORAL
  Filled 2019-05-26 (×7): qty 1

## 2019-05-26 MED ORDER — ATENOLOL 50 MG PO TABS
50.0000 mg | ORAL_TABLET | Freq: Every day | ORAL | Status: DC
  Administered 2019-05-26 – 2019-05-27 (×2): 50 mg via ORAL
  Filled 2019-05-26 (×3): qty 1

## 2019-05-26 MED ORDER — ASPIRIN EC 81 MG PO TBEC
81.0000 mg | DELAYED_RELEASE_TABLET | Freq: Every day | ORAL | Status: DC
  Administered 2019-05-26 – 2019-05-29 (×4): 81 mg via ORAL
  Filled 2019-05-26 (×4): qty 1

## 2019-05-26 MED ORDER — ENOXAPARIN SODIUM 40 MG/0.4ML ~~LOC~~ SOLN
40.0000 mg | SUBCUTANEOUS | Status: DC
  Administered 2019-05-26 – 2019-05-28 (×3): 40 mg via SUBCUTANEOUS
  Filled 2019-05-26 (×3): qty 0.4

## 2019-05-26 MED ORDER — INSULIN GLARGINE 100 UNIT/ML ~~LOC~~ SOLN
10.0000 [IU] | Freq: Every day | SUBCUTANEOUS | Status: DC
  Administered 2019-05-26 – 2019-05-28 (×3): 10 [IU] via SUBCUTANEOUS
  Filled 2019-05-26 (×4): qty 0.1

## 2019-05-26 NOTE — Plan of Care (Signed)
Patient tolerating carb mod diet, no nausea, vomiting or diarrhea this shift.  Able to wean patient to room air and maintain sats in the 90's.  Patient up to chair with one assist, legs weak.  Awaiting PT/OT consult.  Spoke with daughter x 2 this shift with updates.

## 2019-05-26 NOTE — Progress Notes (Signed)
PROGRESS NOTE    Blake Herman  XMI:[id number] DOB: 07/12/1945 DOA: 05/25/2019 PCP: Benito Mccreedy, MD    Brief Narrative:  Blake Herman is a 73 year old male with past medical history remarkable for HTN, DM2, thrombocytopenia, questionable left vestibular schwannoma who presented to the ED with cough, brown sputum over the past 2 days.  He also has associated slight fever, loose stools. Pt denies alteration in sense of smell or taste , cp, palp, sob, n/v, abd pain, brbpr, black stool.  In the ED, T 102.5, P 73, R 28, Bp 117/53,  Pox 93% on RA. Wt 81.3kg. Na 138, K 3.4, Bun 54, Creatinne 2.06, Ast 76, Alt 44, Alk phos 53, T. Bili 1.4, Wbc 7.2, Hgb 14.3, Plt 92, D dimer 1.35, Procalcitonin 16.41, LDH 261, Ferritin 1,373, Tg 103, Crp 27.7, Urinalysis prot 30, wbc 6-10, bacteria many. covid Ag positive. Lactic acid 1.7.  Chest x-ray with mild opacification medial left base/retrocardiac region due to atelectasis versus infection. Pt received remdesivir, dexamethasone 11m iv x1, rocephin 1gm iv x 1, and zithromax 5071miv x1 in ED. Pt will be admitted for covid -19, and CAP, and ? Acute lower uti, and Acute renal failure.     Assessment & Plan:   Active Problems:   Diabetes ([facility]  Essential hypertension, benign   COVID-19 virus infection   ARF (acute renal failure) (HCC)   CAP (community acquired pneumonia)   Abnormal liver function   Hypokalemia   Acute hypoxic respiratory failure secondary to acute Covid-19 viral pneumonia during the ongoing 2020 Covid 19 Pandemic, sepsis - POA Patient presenting with cough with associated brown sputum and fever during the previous 2 days.  Was found to have Covid-19/SARS-CoV-2 positive.  Patient with elevated inflammatory markers, elevated procalcitonin, and elevated D-dimer. --Blood cultures x2: No growth <24h --Continue remdesivir, plan 5-day course --Continue Decadron 6 mg p.o. daily --Continue supplemental oxygen, titrate to maintain SPO2  greater than 92%; currently on 1 L nasal cannula --Continue supportive care with vitamin C, zinc, Tylenol --Follow CBC, CMP, D-dimer, ferritin, and CRP daily --Continue airborne/contact isolation precautions  Community-acquired pneumonia Patient presenting with productive cough with brown sputum.  Fever 102.5 on admission.  Chest x-ray notable for mild opacification medial left base/retrocardiac region.  Procalcitonin elevated at 16.41.  WBC count 7.2. --Urine Legionella and strep pneumo antigen: Pending --Continue antibiotics with azithromycin and ceftriaxone --Follow CBC and procalcitonin daily  Elevated D-dimer --VQ scan: Pending  Acute renal failure Creatinine 2.06 on admission.  Likely etiology prerenal azotemia from dehydration versus ATN from underlying sepsis as above.  Ultrasound abdomen with no acute findings with no hydronephrosis or mass or obstructive findings bilateral kidneys. --Cr 2.06-->1.29 --Holding home chlorthalidone, HCTZ, losartan --Continue IVF with NS at 12584mr --Avoid nephrotoxins, renally dose all medications --Follow renal function daily  Hypokalemia Potassium 3.4 on admission, repleted.  Potassium up to 3.7 today. --Monitor electrolytes daily to include magnesium  Transaminitis: Improving --AST 76-->54 --ALT 44-->36 --Tbili 1.4-->0.7 --Acute hepatitis panel negative. --Ultrasound abdomen unrevealing. --Avoid hepatotoxins --Continue to monitor CMP daily  Essential hypertension BP 132/70 this morning, fairly well controlled. --Holding home chlorthalidone and HCTZ and losartan for acute renal failure --Continue atenolol 50 mg p.o. daily --Started on hydralazine 25 mg p.o. every 8 hours --Continue aspirin --Continue monitor blood pressure closely  Type 2 diabetes mellitus On Metformin 500 mg p.o. twice daily outpatient. --Holding home Metformin secondary to acute renal failure --check Hemoglobin A1c in the am --start Lantus 10u [place]  qHS --  Insulin sliding scale for coverage --CBGs qAC/HS  Hypothyroidism: Continue levothyroxine 25 mcg p.o. daily  BPH: Continue tamsulosin 0.4 mg p.o. nightly  Weakness/debility: PT/OT consultation: Pending   DVT prophylaxis: Lovenox, SCDs Code Status: Full code Family Communication: None present at bedside Disposition Plan: Continue inpatient, remdesivir, steroids, further dependent on clinical course   Consultants:   none  Procedures:   none  Antimicrobials:   Remdesivir 12/26>>  Azithromycin 12/26>>  Ceftriaxone 12/26>>     Subjective: Patient seen and examined at bedside, resting comfortably.  Oxygen now weaned down to 1 L per nasal cannula.  Reports weakness/fatigue and mild shortness of breath.  No other specific complaints this morning.  Denies headache, no current fever, no night sweats/chills, no chest pain, palpitations, no abdominal pain, no congestion, no paresthesias.  No acute events overnight per nursing staff.  Objective: Vitals:   05/26/19 0156 05/26/19 0540 05/26/19 0744 05/26/19 1226  BP:  132/70  122/67  Pulse:  66  69  Resp:    17  Temp:  98.8 F (37.1 C) 98.4 F (36.9 C) 97.8 F (36.6 C)  TempSrc:  Oral Oral Oral  SpO2: 96% 91% 93% 94%  Weight:      Height:        Intake/Output Summary (Last 24 hours) at 05/26/2019 1616 Last data filed at 05/26/2019 1241 Gross per 24 hour  Intake 1315.93 ml  Output 1650 ml  Net -334.07 ml   Filed Weights   05/25/19 2020  Weight: 81.3 kg    Examination:  General exam: Appears calm and comfortable  Respiratory system: Clear to auscultation. Respiratory effort normal.  Liter nasal cannula oxygenating 93%.  No accessory muscle use. Cardiovascular system: S1 & S2 heard, RRR. No JVD, murmurs, rubs, gallops or clicks. No pedal edema. Gastrointestinal system: Abdomen is nondistended, soft and nontender. No organomegaly or masses felt. Normal bowel sounds heard. Central nervous system: Alert and  oriented. No focal neurological deficits. Extremities: Symmetric 5 x 5 power. Skin: No rashes, lesions or ulcers Psychiatry: Judgement and insight appear normal. Mood & affect appropriate.     Data Reviewed: I have personally reviewed following labs and imaging studies  CBC: Recent Labs  Lab 05/25/19 0901 05/26/19 0348  WBC 7.2 7.6  NEUTROABS 6.2 6.2  HGB 14.3 14.0  HCT 43.6 42.8  MCV 88.3 89.0  PLT 92* 96*   Basic Metabolic Panel: Recent Labs  Lab 05/25/19 0901 05/25/19 2124 05/26/19 0348  NA 138  --  140  K 3.4*  --  3.7  CL 100  --  102  CO2 25  --  20*  GLUCOSE 212*  --  260*  BUN 54*  --  45*  CREATININE 2.06* 1.62* 1.29*  CALCIUM 9.0  --  7.7*   GFR: Estimated Creatinine Clearance: 52.7 mL/min (A) (by C-G formula based on SCr of 1.29 mg/dL (H)). Liver Function Tests: Recent Labs  Lab 05/25/19 0901 05/26/19 0348  AST 76* 54*  ALT 44 36  ALKPHOS 53 40  BILITOT 1.4* 0.7  PROT 7.9 6.7  ALBUMIN 3.9 3.1*   No results for input(s): LIPASE, AMYLASE in the last 168 hours. No results for input(s): AMMONIA in the last 168 hours. Coagulation Profile: No results for input(s): INR, PROTIME in the last 168 hours. Cardiac Enzymes: No results for input(s): CKTOTAL, CKMB, CKMBINDEX, TROPONINI in the last 168 hours. BNP (last 3 results) No results for input(s): PROBNP in the last 8760 hours. HbA1C: No results for input(s):  HGBA1C in the last 72 hours. CBG: Recent Labs  Lab 05/26/19 0004 05/26/19 0731 05/26/19 1224  GLUCAP 252* 214* 228*   Lipid Profile: Recent Labs    05/25/19 0901  TRIG 103   Thyroid Function Tests: No results for input(s): TSH, T4TOTAL, FREET4, T3FREE, THYROIDAB in the last 72 hours. Anemia Panel: Recent Labs    05/25/19 0901  FERRITIN 1,373*   Sepsis Labs: Recent Labs  Lab 05/25/19 0901  PROCALCITON 16.41  LATICACIDVEN 1.7    Recent Results (from the past 240 hour(s))  Urine culture     Status: Abnormal   Collection  Time: 05/25/19  8:40 AM   Specimen: Urine, Clean Catch  Result Value Ref Range Status   Specimen Description   Final    URINE, CLEAN CATCH Performed at [facility], [address] [place], [place] [address]    Special Requests   Final    NONE Performed at [facility], [address] [place], [place] [address]    Culture (A)  Final    <10,000 COLONIES/mL INSIGNIFICANT GROWTH Performed at [facility], [place] [address], [place], [place] [address]    Report Status 05/26/2019 FINAL  Final  Blood Culture (routine x 2)     Status: None (Preliminary result)   Collection Time: 05/25/19  9:01 AM   Specimen: Right Antecubital; Blood  Result Value Ref Range Status   Specimen Description   Final    RIGHT ANTECUBITAL Performed at [facility], [address] [place], [place] [address]    Special Requests   Final    BOTTLES DRAWN AEROBIC AND ANAEROBIC Blood Culture adequate volume Performed at [facility], [address] [place], [place] [address]    Culture   Final    NO GROWTH < 24 HOURS Performed at [facility], [place] [address], [place], [place] [address]    Report Status PENDING  Incomplete  Blood Culture (routine x 2)     Status: None (Preliminary result)   Collection Time: 05/25/19  9:01 AM   Specimen: Left Antecubital; Blood  Result Value Ref Range Status   Specimen Description   Final    LEFT ANTECUBITAL Performed at [facility] [address] [place], [place] [address]    Special Requests   Final    BOTTLES DRAWN AEROBIC AND ANAEROBIC Blood Culture results may not be optimal due to an inadequate volume of blood received in culture bottles Performed at [facility], [address] [place], [place] [address]    Culture   Final    NO GROWTH < 24 HOURS Performed at [facility], [place] [address] [place], [place] [address]    Report Status PENDING  Incomplete  SARS Coronavirus 2 Ag (30  min TAT) - Urine, Clean Catch     Status: Abnormal   Collection Time: 05/25/19  9:02 AM   Specimen: Urine, Clean Catch; Nasal Swab  Result Value Ref Range Status   SARS Coronavirus 2 Ag POSITIVE (A) NEGATIVE Final    Comment: RESULT CALLED TO, READ BACK BY AND VERIFIED WITH: CALLED TO C.REED RN AT [place] [address] (NOTE) SARS-CoV-2 antigen PRESENT. Positive results indicate the presence of viral antigens, but clinical correlation with patient history and other diagnostic information is necessary to determine patient infection status.  Positive results do not rule out bacterial infection or co-infection  with other viruses. False positive results are rare but can occur, and confirmatory RT-PCR testing may be appropriate in some circumstances. The expected result is Negative. Fact Sheet for Patients: PodPark.tn Fact Sheet for Providers: GiftContent.is  This test is not yet approved or cleared by the [place] FDA and  has been authorized for detection and/or diagnosis of SARS-CoV-2 by FDA under an Emergency Use Authorization (EUA).  This EUA will remain in effect (meaning this test can be used) for the duration of  the COVID- 19 declaration under Section 564(b)(1) of the Act, 21 U.S.C. section 360bbb-3(b)(1), unless the authorization is terminated or revoked sooner. Performed at [facility], [address] [place], [place] [address]          Radiology Studies: US Abdomen Complete  Result Date: 05/26/2019 CLINICAL DATA:  Abnormal liver function. Acute renal failure. COVID-19 positive. EXAM: ABDOMEN ULTRASOUND COMPLETE COMPARISON:  None. FINDINGS: Gallbladder: Previous cholecystectomy. Common bile duct: Diameter: 4.9 mm. Liver: Mild coarse increased echogenicity compatible with steatosis with 2.4 cm geographic area of decreased echogenicity towards the porta hepatis likely focal fatty sparing. Portal  vein is patent on color Doppler imaging with normal direction of blood flow towards the liver. IVC: No abnormality visualized. Pancreas: Not visualized. Spleen: Size and appearance within normal limits. Right Kidney: Length: 10.8 cm. Echogenicity within normal limits. No mass or hydronephrosis visualized. Left Kidney: Length: 10.5 cm. Echogenicity within normal limits. No mass or hydronephrosis visualized. Abdominal aorta: No aneurysm visualized. Other findings: None. IMPRESSION: 1.  No acute findings. 2.  Previous cholecystectomy. 3.  Mild hepatic steatosis with focal fatty sparing centrally. Electronically Signed   By: Marin Olp M.D.   On: 05/26/2019 08:36   DG Chest Port 1 View  Result Date: 05/25/2019 CLINICAL DATA:  Cough and fever 3 days which shortness-of-breath and sore throat. EXAM: PORTABLE CHEST 1 VIEW COMPARISON:  05/27/2016 FINDINGS: Patient slightly rotated to the left. Subtle interstitial prominence over the lung bases likely chronic. Possible minimal opacification in the left retrocardiac region/medial left base. Cardiomediastinal silhouette and remainder the exam is unchanged. IMPRESSION: Possible mild opacification in the medial left base/retrocardiac region which may be due to atelectasis or infection. Subtle bibasilar interstitial prominence likely chronic. Electronically Signed   By: Marin Olp M.D.   On: 05/25/2019 09:40        Scheduled Meds: . aspirin EC  81 mg Oral Daily  . atenolol  50 mg Oral Daily  . dexamethasone (DECADRON) injection  6 mg Intravenous Q24H  . enoxaparin (LOVENOX) injection  40 mg Subcutaneous Q24H  . hydrALAZINE  25 mg Oral Q8H  . insulin aspart  0-5 Units Subcutaneous QHS  . insulin aspart  0-9 Units Subcutaneous TID WC  . levothyroxine  25 mcg Oral QAC breakfast  . tamsulosin  0.4 mg Oral Daily   Continuous Infusions: . sodium chloride Stopped (05/25/19 1824)  . sodium chloride 1,000 mL (05/26/19 1041)  . azithromycin 500 mg (05/26/19  1240)  . cefTRIAXone (ROCEPHIN)  IV 2 g (05/26/19 0931)  . remdesivir 100 mg in NS 100 mL       LOS: 1 day    Time spent: 36 minutes spent on chart review, discussion with nursing staff, consultants, updating family and interview/physical exam; more than 50% of that time was spent in counseling and/or coordination of care.    Tharon Bomar J [place] DO [facility] 05/26/2019, 4:16 PM

## 2019-05-27 LAB — CBC WITH DIFFERENTIAL/PLATELET
Abs Immature Granulocytes: 0.04 10*3/uL (ref 0.00–0.07)
Basophils Absolute: 0 10*3/uL (ref 0.0–0.1)
Basophils Relative: 0 %
Eosinophils Absolute: 0 10*3/uL (ref 0.0–0.5)
Eosinophils Relative: 0 %
HCT: 40.4 % (ref 39.0–52.0)
Hemoglobin: 13 g/dL (ref 13.0–17.0)
Immature Granulocytes: 1 %
Lymphocytes Relative: 14 %
Lymphs Abs: 1 10*3/uL (ref 0.7–4.0)
MCH: 28.6 pg (ref 26.0–34.0)
MCHC: 32.2 g/dL (ref 30.0–36.0)
MCV: 89 fL (ref 80.0–100.0)
Monocytes Absolute: 0.4 10*3/uL (ref 0.1–1.0)
Monocytes Relative: 6 %
Neutro Abs: 5.5 10*3/uL (ref 1.7–7.7)
Neutrophils Relative %: 79 %
Platelets: 98 10*3/uL — ABNORMAL LOW (ref 150–400)
RBC: 4.54 MIL/uL (ref 4.22–5.81)
RDW: 14.5 % (ref 11.5–15.5)
WBC: 7 10*3/uL (ref 4.0–10.5)
nRBC: 0 % (ref 0.0–0.2)

## 2019-05-27 LAB — COMPREHENSIVE METABOLIC PANEL
ALT: 29 U/L (ref 0–44)
AST: 44 U/L — ABNORMAL HIGH (ref 15–41)
Albumin: 2.6 g/dL — ABNORMAL LOW (ref 3.5–5.0)
Alkaline Phosphatase: 38 U/L (ref 38–126)
Anion gap: 10 (ref 5–15)
BUN: 39 mg/dL — ABNORMAL HIGH (ref 8–23)
CO2: 20 mmol/L — ABNORMAL LOW (ref 22–32)
Calcium: 7.6 mg/dL — ABNORMAL LOW (ref 8.9–10.3)
Chloride: 108 mmol/L (ref 98–111)
Creatinine, Ser: 1.25 mg/dL — ABNORMAL HIGH (ref 0.61–1.24)
GFR calc Af Amer: >60 mL/min (ref >60)
GFR calc non Af Amer: 56 mL/min — ABNORMAL LOW (ref >60)
Glucose, Bld: 160 mg/dL — ABNORMAL HIGH (ref 70–99)
Potassium: 3.6 mmol/L (ref 3.5–5.1)
Sodium: 138 mmol/L (ref 135–145)
Total Bilirubin: 0.5 mg/dL (ref 0.3–1.2)
Total Protein: 5.4 g/dL — ABNORMAL LOW (ref 6.5–8.1)

## 2019-05-27 LAB — C-REACTIVE PROTEIN: CRP: 7.4 mg/dL — ABNORMAL HIGH (ref <1.0)

## 2019-05-27 LAB — GLUCOSE, CAPILLARY
Glucose-Capillary: 133 mg/dL — ABNORMAL HIGH (ref 70–99)
Glucose-Capillary: 235 mg/dL — ABNORMAL HIGH (ref 70–99)
Glucose-Capillary: 301 mg/dL — ABNORMAL HIGH (ref 70–99)
Glucose-Capillary: 272 mg/dL — ABNORMAL HIGH (ref 70–99)

## 2019-05-27 LAB — FERRITIN: Ferritin: 1430 ng/mL — ABNORMAL HIGH (ref 24–336)

## 2019-05-27 LAB — MAGNESIUM: Magnesium: 2 mg/dL (ref 1.7–2.4)

## 2019-05-27 LAB — PROCALCITONIN: Procalcitonin: 4.9 ng/mL

## 2019-05-27 LAB — D-DIMER, QUANTITATIVE: D-Dimer, Quant: 0.65 ug/mL-FEU — ABNORMAL HIGH (ref 0.00–0.50)

## 2019-05-27 MED ORDER — AZITHROMYCIN 250 MG PO TABS
500.0000 mg | ORAL_TABLET | Freq: Every day | ORAL | Status: DC
  Administered 2019-05-28 – 2019-05-29 (×2): 500 mg via ORAL
  Filled 2019-05-27 (×2): qty 2

## 2019-05-27 NOTE — Progress Notes (Signed)
PHARMACIST - PHYSICIAN COMMUNICATION  CONCERNING: Antibiotic IV to Oral Route Change Policy  RECOMMENDATION: This patient is receiving azithromycin by the intravenous route.  Based on criteria approved by the Pharmacy and Therapeutics Committee, the antibiotic(s) is/are being converted to the equivalent oral dose form(s).   DESCRIPTION: These criteria include:  Patient being treated for a respiratory tract infection, urinary tract infection, cellulitis or clostridium difficile associated diarrhea if on metronidazole  The patient is not neutropenic and does not exhibit a GI malabsorption state  The patient is eating (either orally or via tube) and/or has been taking other orally administered medications for a least 24 hours  The patient is improving clinically and has a Tmax < 100.5  If you have questions about this conversion, please contact the Pharmacy Department  []  ( [phone] )  [place] []  ( [phone] )   [facility] []  ( [phone]  [facility] []  ( [phone] )  [facility] [x]  ( [phone] )  [facility]  

## 2019-05-27 NOTE — Evaluation (Signed)
Occupational Therapy Evaluation Patient Details Name: Blake Herman MRN: [id number] DOB: November 22, 1945 Today's Date: 05/27/2019    History of Present Illness 73 yo male admitted with COVID 19 Pna, sepsis. Hx of DM, vestibular schwanoma   Clinical Impression   Pt admitted with COVID. Pt currently with functional limitations due to the deficits listed below (see OT Problem List).  Pt will benefit from skilled OT to increase their safety and independence with ADL and functional mobility for ADL to facilitate discharge to venue listed below.   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 Pt very pleasant and motivated.  Pt on 2L of oxygen and sats 88.  Spoke with RN and bumped back up to 4 with activity and sats 91    Follow Up Recommendations  Home health OT;Supervision/Assistance - 24 hour    Equipment Recommendations  3 in 1 bedside commode       Precautions / Restrictions Precautions Precautions: Fall Precaution Comments: monitor O2 sats Restrictions Weight Bearing Restrictions: No      Mobility Bed Mobility Overal bed mobility: Modified Independent                Transfers Overall transfer level: Needs assistance Equipment used: (IV pole/dynamap) Transfers: Sit to/from Stand Sit to Stand: Min assist         General transfer comment: Assist to stabilize, Posterios bias with near LOB. Cues for safety.    Balance Overall balance assessment: Needs assistance         Standing balance support: Bilateral upper extremity supported Standing balance-Leahy Scale: Poor                             ADL either performed or assessed with clinical judgement   ADL Overall ADL's : Needs assistance/impaired Eating/Feeding: Set up;Sitting   Grooming: Wash/dry face;Min guard   Upper Body Bathing: Set up;Sitting   Lower Body Bathing: Minimal assistance;Cueing for sequencing;Cueing for safety;Sit to/from stand   Upper Body Dressing : Set up;Sitting    Lower Body Dressing: Cueing for safety;Minimal assistance   Toilet Transfer: Minimal assistance;Cueing for safety;Cueing for sequencing;Stand-pivot;RW;BSC   Toileting- Clothing Manipulation and Hygiene: Minimal assistance;Cueing for safety;Cueing for sequencing       Functional mobility during ADLs: Minimal assistance;Rolling walker;Cueing for safety;Cueing for sequencing       Vision Baseline Vision/History: No visual deficits              Pertinent Vitals/Pain Pain Assessment: No/denies pain     Hand Dominance     Extremity/Trunk Assessment Upper Extremity Assessment Upper Extremity Assessment: Overall WFL for tasks assessed   Lower Extremity Assessment Lower Extremity Assessment: Generalized weakness   Cervical / Trunk Assessment Cervical / Trunk Assessment: Normal   Communication Communication Communication: No difficulties   Cognition Arousal/Alertness: Awake/alert Behavior During Therapy: WFL for tasks assessed/performed Overall Cognitive Status: Within Functional Limits for tasks assessed                                                Home Living Family/patient expects to be discharged to:: Private residence Living Arrangements: Spouse/significant other Available Help at Discharge: Family Type of Home: House Home Access: Stairs to enter Technical brewer of Steps: 3   Home Layout: One level  Bathroom Toilet: Standard     Home Equipment: Cane - single point          Prior Functioning/Environment Level of Independence: Independent                 OT Problem List: Decreased strength;Decreased activity tolerance;Impaired balance (sitting and/or standing)      OT Treatment/Interventions: Self-care/ADL training;DME and/or AE instruction;Therapeutic activities    OT Goals(Current goals can be found in the care plan section) Acute Rehab OT Goals 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 OT Goal  Formulation: With patient Time For Goal Achievement: 06/04/19  OT Frequency: Min 2X/week    AM-PAC OT "6 Clicks" Daily Activity     Outcome Measure Help from another person eating meals?: None Help from another person taking care of personal grooming?: A Little Help from another person toileting, which includes using toliet, bedpan, or urinal?: A Little Help from another person bathing (including washing, rinsing, drying)?: A Little Help from another person to put on and taking off regular upper body clothing?: A Little Help from another person to put on and taking off regular lower body clothing?: A Little 6 Click Score: 19   End of Session Equipment Utilized During Treatment: Rolling walker;Oxygen Nurse Communication: Mobility status  Activity Tolerance: Patient tolerated treatment well Patient left: in chair;with call bell/phone within reach  OT Visit Diagnosis: Unsteadiness on feet (R26.81);Repeated falls (R29.6);Muscle weakness (generalized) (M62.81)                Time: [id number] OT Time Calculation (min): 21 min Charges:  OT General Charges $OT Visit: 1 Visit OT Evaluation $OT Eval Moderate Complexity: 1 Mod  Kari Baars, [place] [phone] Office- [phone], Edwena Felty D 05/27/2019, 7:31 PM

## 2019-05-27 NOTE — Progress Notes (Signed)
PROGRESS NOTE    Blake Herman  YIR:[id number] DOB: 1945-12-12 DOA: 05/25/2019 PCP: Benito Mccreedy, MD    Brief Narrative:  Blake Herman is a 73 year old male with past medical history remarkable for HTN, DM2, thrombocytopenia, questionable left vestibular schwannoma who presented to the ED with cough, brown sputum over the past 2 days.  He also has associated slight fever, loose stools. Pt denies alteration in sense of smell or taste , cp, palp, sob, n/v, abd pain, brbpr, black stool.  In the ED, T 102.5, P 73, R 28, Bp 117/53,  Pox 93% on RA. Wt 81.3kg. Na 138, K 3.4, Bun 54, Creatinne 2.06, Ast 76, Alt 44, Alk phos 53, T. Bili 1.4, Wbc 7.2, Hgb 14.3, Plt 92, D dimer 1.35, Procalcitonin 16.41, LDH 261, Ferritin 1,373, Tg 103, Crp 27.7, Urinalysis prot 30, wbc 6-10, bacteria many. covid Ag positive. Lactic acid 1.7.  Chest x-ray with mild opacification medial left base/retrocardiac region due to atelectasis versus infection. Pt received remdesivir, dexamethasone 54m iv x1, rocephin 1gm iv x 1, and zithromax 5049miv x1 in ED. Pt will be admitted for covid -19, and CAP, and ? Acute lower uti, and Acute renal failure.     Assessment & Plan:   Active Problems:   Diabetes ([facility]  Essential hypertension, benign   COVID-19 virus infection   ARF (acute renal failure) (HCC)   CAP (community acquired pneumonia)   Abnormal liver function   Hypokalemia   Acute hypoxic respiratory failure secondary to acute Covid-19 viral pneumonia during the ongoing 2020 Covid 19 Pandemic, sepsis - POA Patient presenting with cough with associated brown sputum and fever during the previous 2 days.  Was found to have Covid-19/SARS-CoV-2 positive.  Patient with elevated inflammatory markers, elevated procalcitonin, and elevated D-dimer. --Blood cultures x2: No growth <24h --CRP 27.7-->7.4 --Continue remdesivir, day #3/5 day course --Continue Decadron 6 mg p.o. daily, plan 10 day course --Continue  supplemental oxygen, titrate to maintain SPO2 greater than 92%; currently on 1 L nasal cannula --Continue supportive care with vitamin C, zinc, Tylenol --Follow CBC, CMP, D-dimer, ferritin, and CRP daily --Ambulate on room air today to see if there is any significant desaturation --Continue airborne/contact isolation precautions  Community-acquired pneumonia Patient presenting with productive cough with brown sputum.  Fever 102.5 on admission.  Chest x-ray notable for mild opacification medial left base/retrocardiac region.  Procalcitonin elevated at 16.41.  WBC count 7.2. --Urine Legionella and strep pneumo antigen: Pending --Continue antibiotics with azithromycin and ceftriaxone --Follow CBC and procalcitonin daily  Acute renal failure Creatinine 2.06 on admission.  Likely etiology prerenal azotemia from dehydration versus ATN from underlying sepsis as above.  Ultrasound abdomen with no acute findings with no hydronephrosis or mass or obstructive findings bilateral kidneys. --Cr 2.06-->1.29-->1.25 --Holding home chlorthalidone, HCTZ, losartan --Continue IVF with NS at 12566mr --Avoid nephrotoxins, renally dose all medications --Follow renal function daily  Hypokalemia Potassium 3.4 on admission, repleted.  Potassium up to 3.7 today. --Monitor electrolytes daily to include magnesium  Transaminitis: Improving --AST 76-->54-->44 --ALT 44-->36-->29 --Tbili 1.4-->0.7-->0.5 --Acute hepatitis panel negative. --Ultrasound abdomen unrevealing. --Avoid hepatotoxins --Continue to monitor CMP daily  Essential hypertension BP 131/55 this morning, fairly well controlled. --Holding home chlorthalidone and HCTZ and losartan for acute renal failure --Continue atenolol 50 mg p.o. daily --Started on hydralazine 25 mg p.o. every 8 hours --Continue aspirin --Continue monitor blood pressure closely  Type 2 diabetes mellitus On Metformin 500 mg p.o. twice daily outpatient. --Holding home  Metformin secondary to  acute renal failure --check Hemoglobin A1c in the am --started on Lantus 10u  qHS --Insulin sliding scale for coverage --CBGs qAC/HS  Hypothyroidism: Continue levothyroxine 25 mcg p.o. daily  BPH: Continue tamsulosin 0.4 mg p.o. nightly  Weakness/debility: PT/OT consultation: Pending   DVT prophylaxis: Lovenox, SCDs Code Status: Full code Family Communication: None present at bedside Disposition Plan: Continue inpatient, remdesivir, steroids, further dependent on clinical course, likely discharge home on 12/30 following last remdesivir dose.   Consultants:   none  Procedures:   none  Antimicrobials:   Remdesivir 12/26>>  Azithromycin 12/26>>  Ceftriaxone 12/26>>     Subjective: Patient seen and examined at bedside, resting comfortably.  Oxygen now weaned off, but SPO2 remains 90% at rest..  Reports weakness/fatigue and mild shortness of breath.  No other specific complaints this morning.  Denies headache, no current fever, no night sweats/chills, no chest pain, palpitations, no abdominal pain, no congestion, no paresthesias.  No acute events overnight per nursing staff.  Objective: Vitals:   05/26/19 1638 05/26/19 2206 05/27/19 0548 05/27/19 1211  BP:  116/60 (!) 131/55 121/71  Pulse:  63 (!) 56 (!) 58  Resp:   20   Temp:  99.1 F (37.3 C) 98.6 F (37 C) 98.2 F (36.8 C)  TempSrc:  Oral Oral Oral  SpO2: 94% 90% 90% 93%  Weight:      Height:        Intake/Output Summary (Last 24 hours) at 05/27/2019 1319 Last data filed at 05/27/2019 1210 Gross per 24 hour  Intake 2901.12 ml  Output 400 ml  Net 2501.12 ml   Filed Weights   05/25/19 2020  Weight: 81.3 kg    Examination:  General exam: Appears calm and comfortable  Respiratory system: Clear to auscultation. Respiratory effort normal.  Liter nasal cannula oxygenating 93%.  No accessory muscle use. Cardiovascular system: S1 & S2 heard, RRR. No JVD, murmurs, rubs, gallops or  clicks. No pedal edema. Gastrointestinal system: Abdomen is nondistended, soft and nontender. No organomegaly or masses felt. Normal bowel sounds heard. Central nervous system: Alert and oriented. No focal neurological deficits. Extremities: Symmetric 5 x 5 power. Skin: No rashes, lesions or ulcers Psychiatry: Judgement and insight appear normal. Mood & affect appropriate.     Data Reviewed: I have personally reviewed following labs and imaging studies  CBC: Recent Labs  Lab 05/25/19 0901 05/26/19 0348 05/27/19 0345  WBC 7.2 7.6 7.0  NEUTROABS 6.2 6.2 5.5  HGB 14.3 14.0 13.0  HCT 43.6 42.8 40.4  MCV 88.3 89.0 89.0  PLT 92* 96* 98*   Basic Metabolic Panel: Recent Labs  Lab 05/25/19 0901 05/25/19 2124 05/26/19 0348 05/27/19 0345  NA 138  --  140 138  K 3.4*  --  3.7 3.6  CL 100  --  102 108  CO2 25  --  20* 20*  GLUCOSE 212*  --  260* 160*  BUN 54*  --  45* 39*  CREATININE 2.06* 1.62* 1.29* 1.25*  CALCIUM 9.0  --  7.7* 7.6*  MG  --   --   --  2.0   GFR: Estimated Creatinine Clearance: 54.4 mL/min (A) (by C-G formula based on SCr of 1.25 mg/dL (H)). Liver Function Tests: Recent Labs  Lab 05/25/19 0901 05/26/19 0348 05/27/19 0345  AST 76* 54* 44*  ALT 44 36 29  ALKPHOS 53 40 38  BILITOT 1.4* 0.7 0.5  PROT 7.9 6.7 5.4*  ALBUMIN 3.9 3.1* 2.6*   No results for  input(s): LIPASE, AMYLASE in the last 168 hours. No results for input(s): AMMONIA in the last 168 hours. Coagulation Profile: No results for input(s): INR, PROTIME in the last 168 hours. Cardiac Enzymes: No results for input(s): CKTOTAL, CKMB, CKMBINDEX, TROPONINI in the last 168 hours. BNP (last 3 results) No results for input(s): PROBNP in the last 8760 hours. HbA1C: No results for input(s): HGBA1C in the last 72 hours. CBG: Recent Labs  Lab 05/26/19 1224 05/26/19 1635 05/26/19 2001 05/27/19 0816 05/27/19 1207  GLUCAP 228* 231* 282* 133* 235*   Lipid Profile: Recent Labs    05/25/19 0901   TRIG 103   Thyroid Function Tests: No results for input(s): TSH, T4TOTAL, FREET4, T3FREE, THYROIDAB in the last 72 hours. Anemia Panel: Recent Labs    05/25/19 0901 05/27/19 0345  FERRITIN 1,373* 1,430*   Sepsis Labs: Recent Labs  Lab 05/25/19 0901 05/27/19 0345  PROCALCITON 16.41 4.90  LATICACIDVEN 1.7  --     Recent Results (from the past 240 hour(s))  Urine culture     Status: Abnormal   Collection Time: 05/25/19  8:40 AM   Specimen: Urine, Clean Catch  Result Value Ref Range Status   Specimen Description   Final    URINE, CLEAN CATCH Performed at [facility], [address] [place], [place] [address]    Special Requests   Final    NONE Performed at [facility], [address] [place], [place] [address]    Culture (A)  Final    <10,000 COLONIES/mL INSIGNIFICANT GROWTH Performed at [facility], [place] [address], [place], [place] [address]    Report Status 05/26/2019 FINAL  Final  Blood Culture (routine x 2)     Status: None (Preliminary result)   Collection Time: 05/25/19  9:01 AM   Specimen: Right Antecubital; Blood  Result Value Ref Range Status   Specimen Description   Final    RIGHT ANTECUBITAL Performed at [facility], [address] [place], [place] [address]    Special Requests   Final    BOTTLES DRAWN AEROBIC AND ANAEROBIC Blood Culture adequate volume Performed at [facility], [address] [place], [place] [address]    Culture   Final    NO GROWTH < 24 HOURS Performed at [facility], [place] [address] [place], [place] [address]    Report Status PENDING  Incomplete  Blood Culture (routine x 2)     Status: None (Preliminary result)   Collection Time: 05/25/19  9:01 AM   Specimen: Left Antecubital; Blood  Result Value Ref Range Status   Specimen Description   Final    LEFT ANTECUBITAL Performed at [facility], [address] [place], [place] [address]    Special  Requests   Final    BOTTLES DRAWN AEROBIC AND ANAEROBIC Blood Culture results may not be optimal due to an inadequate volume of blood received in culture bottles Performed at [facility], [address] [place], [place] [address]    Culture   Final    NO GROWTH < 24 HOURS Performed at [facility], [place] [address], [place], [place] [address]    Report Status PENDING  Incomplete  SARS Coronavirus 2 Ag (30 min TAT) - Urine, Clean Catch     Status: Abnormal   Collection Time: 05/25/19  9:02 AM   Specimen: Urine, Clean Catch; Nasal  Swab  Result Value Ref Range Status   SARS Coronavirus 2 Ag POSITIVE (A) NEGATIVE Final    Comment: RESULT CALLED TO, READ BACK BY AND VERIFIED WITH: CALLED TO C.REED RN AT [place] [id number] (NOTE) SARS-CoV-2 antigen PRESENT. Positive results indicate the presence of viral antigens, but clinical correlation with patient history and other diagnostic information is necessary to determine patient infection status.  Positive results do not rule out bacterial infection or co-infection  with other viruses. False positive results are rare but can occur, and confirmatory RT-PCR testing may be appropriate in some circumstances. The expected result is Negative. Fact Sheet for Patients: PodPark.tn Fact Sheet for Providers: GiftContent.is  This test is not yet approved or cleared by the [place] FDA and  has been authorized for detection and/or diagnosis of SARS-CoV-2 by FDA under an Emergency Use Authorization (EUA).  This EUA will remain in effect (meaning this test can be used) for the duration of  the COVID- 19 declaration under Section 564(b)(1) of the Act, 21 U.S.C. section 360bbb-3(b)(1), unless the authorization is terminated or revoked sooner. Performed at [facility]: [address], [address], [place], [place] [address]          Radiology Studies: US Abdomen  Complete  Result Date: 05/26/2019 CLINICAL DATA:  Abnormal liver function. Acute renal failure. COVID-19 positive. EXAM: ABDOMEN ULTRASOUND COMPLETE COMPARISON:  None. FINDINGS: Gallbladder: Previous cholecystectomy. Common bile duct: Diameter: 4.9 mm. Liver: Mild coarse increased echogenicity compatible with steatosis with 2.4 cm geographic area of decreased echogenicity towards the porta hepatis likely focal fatty sparing. Portal vein is patent on color Doppler imaging with normal direction of blood flow towards the liver. IVC: No abnormality visualized. Pancreas: Not visualized. Spleen: Size and appearance within normal limits. Right Kidney: Length: 10.8 cm. Echogenicity within normal limits. No mass or hydronephrosis visualized. Left Kidney: Length: 10.5 cm. Echogenicity within normal limits. No mass or hydronephrosis visualized. Abdominal aorta: No aneurysm visualized. Other findings: None. IMPRESSION: 1.  No acute findings. 2.  Previous cholecystectomy. 3.  Mild hepatic steatosis with focal fatty sparing centrally. Electronically Signed   By: Marin Olp M.D.   On: 05/26/2019 08:36        Scheduled Meds: . aspirin EC  81 mg Oral Daily  . atenolol  50 mg Oral Daily  . dexamethasone (DECADRON) injection  6 mg Intravenous Q24H  . enoxaparin (LOVENOX) injection  40 mg Subcutaneous Q24H  . hydrALAZINE  25 mg Oral Q8H  . insulin aspart  0-5 Units Subcutaneous QHS  . insulin aspart  0-9 Units Subcutaneous TID WC  . insulin glargine  10 Units Subcutaneous QHS  . levothyroxine  25 mcg Oral QAC breakfast  . tamsulosin  0.4 mg Oral Daily   Continuous Infusions: . sodium chloride Stopped (05/25/19 1824)  . sodium chloride 1,000 mL (05/27/19 1227)  . azithromycin Stopped (05/26/19 1432)  . cefTRIAXone (ROCEPHIN)  IV 2 g (05/27/19 1229)  . remdesivir 100 mg in NS 100 mL 100 mg (05/27/19 0924)     LOS: 2 days    Time spent: 35 minutes spent on chart review, discussion with nursing staff,  consultants, updating family and interview/physical exam; more than 50% of that time was spent in counseling and/or coordination of care.    Makita Blow J [place] DO [facility] 05/27/2019, 1:19 PM

## 2019-05-27 NOTE — Evaluation (Signed)
Physical Therapy Evaluation Patient Details Name: Blake Herman MRN: [id number] DOB: 1946/03/06 Today's Date: 05/27/2019   History of Present Illness  73 yo male admitted with COVID 19 Pna, sepsis. Hx of DM, vestibular schwanoma  Clinical Impression  On eval, pt required Min assist for mobility. He walked ~30 feet x 2 around the room while holding on to IV pole/dynamap for support. SpO2: 85% on RA, 88% on 2L [place], dyspnea 2/4 during ambulation. Will continue to follow and progress activity as tolerated.     Follow Up Recommendations Home health PT;Supervision/Assistance - 24 hour    Equipment Recommendations  Rolling walker with 5" wheels    Recommendations for Other Services       Precautions / Restrictions Precautions Precautions: Fall Precaution Comments: monitor O2 sats Restrictions Weight Bearing Restrictions: No      Mobility  Bed Mobility Overal bed mobility: Modified Independent                Transfers Overall transfer level: Needs assistance Equipment used: (IV pole/dynamap) Transfers: Sit to/from Stand Sit to Stand: Min assist         General transfer comment: Assist to stabilize, Posterios bias with near LOB. Cues for safety.  Ambulation/Gait Ambulation/Gait assistance: Min assist Gait Distance (Feet): 30 Feet(x2) Assistive device: IV Pole(dynamap) Gait Pattern/deviations: Step-through pattern;Decreased stride length     General Gait Details: Unsteady. Assist to stabilize especially intially. Stability improved a little as distance increased. O2: 85% on RA, 88% on 2L [place].  Stairs            Wheelchair Mobility    Modified Rankin (Stroke Patients Only)       Balance Overall balance assessment: Needs assistance         Standing balance support: Bilateral upper extremity supported Standing balance-Leahy Scale: Poor                               Pertinent Vitals/Pain Pain Assessment: No/denies pain    Home Living  Family/patient expects to be discharged to:: Private residence Living Arrangements: Spouse/significant other Available Help at Discharge: Family Type of Home: House Home Access: Stairs to enter   Technical brewer of Steps: 3 Home Layout: One level Home Equipment: Cane - single point      Prior Function Level of Independence: Independent               Hand Dominance        Extremity/Trunk Assessment   Upper Extremity Assessment Upper Extremity Assessment: Defer to OT evaluation    Lower Extremity Assessment Lower Extremity Assessment: Generalized weakness    Cervical / Trunk Assessment Cervical / Trunk Assessment: Normal  Communication   Communication: No difficulties  Cognition Arousal/Alertness: Awake/alert Behavior During Therapy: WFL for tasks assessed/performed Overall Cognitive Status: Within Functional Limits for tasks assessed                                        General Comments      Exercises     Assessment/Plan    PT Assessment Patient needs continued PT services  PT Problem List Decreased strength;Decreased mobility;Decreased balance;Decreased activity tolerance;Decreased knowledge of use of DME       PT Treatment Interventions DME instruction;Gait training;Therapeutic exercise;Therapeutic activities;Patient/family education;Balance training;Functional mobility training    PT Goals (Current goals can be found  in the Care Plan section)  Acute Rehab PT Goals Patient Stated Goal: none stated PT Goal Formulation: With patient Time For Goal Achievement: 06/10/19 Potential to Achieve Goals: Good    Frequency Min 3X/week   Barriers to discharge        Co-evaluation               AM-PAC PT "6 Clicks" Mobility  Outcome Measure Help needed turning from your back to your side while in a flat bed without using bedrails?: None Help needed moving from lying on your back to sitting on the side of a flat bed without  using bedrails?: None Help needed moving to and from a bed to a chair (including a wheelchair)?: A Little Help needed standing up from a chair using your arms (e.g., wheelchair or bedside chair)?: A Little Help needed to walk in hospital room?: A Little Help needed climbing 3-5 steps with a railing? : A Little 6 Click Score: 20    End of Session Equipment Utilized During Treatment: Oxygen Activity Tolerance: Patient tolerated treatment well Patient left: in chair;with call bell/phone within reach   PT Visit Diagnosis: Muscle weakness (generalized) (M62.81);Unsteadiness on feet (R26.81);Difficulty in walking, not elsewhere classified (R26.2)    Time: [id number] PT Time Calculation (min) (ACUTE ONLY): 24 min   Charges:   PT Evaluation $PT Eval Moderate Complexity: 1 Mod PT Treatments $Gait Training: 8-22 mins          Doreatha Massed, PT Acute Rehabilitation Services

## 2019-05-28 LAB — COMPREHENSIVE METABOLIC PANEL
ALT: 27 U/L (ref 0–44)
AST: 37 U/L (ref 15–41)
Albumin: 2.6 g/dL — ABNORMAL LOW (ref 3.5–5.0)
Alkaline Phosphatase: 40 U/L (ref 38–126)
Anion gap: 11 (ref 5–15)
BUN: 29 mg/dL — ABNORMAL HIGH (ref 8–23)
CO2: 21 mmol/L — ABNORMAL LOW (ref 22–32)
Calcium: 7.8 mg/dL — ABNORMAL LOW (ref 8.9–10.3)
Chloride: 105 mmol/L (ref 98–111)
Creatinine, Ser: 1.13 mg/dL (ref 0.61–1.24)
GFR calc Af Amer: >60 mL/min (ref >60)
GFR calc non Af Amer: >60 mL/min (ref >60)
Glucose, Bld: 212 mg/dL — ABNORMAL HIGH (ref 70–99)
Potassium: 3.7 mmol/L (ref 3.5–5.1)
Sodium: 137 mmol/L (ref 135–145)
Total Bilirubin: 0.5 mg/dL (ref 0.3–1.2)
Total Protein: 5.5 g/dL — ABNORMAL LOW (ref 6.5–8.1)

## 2019-05-28 LAB — FERRITIN: Ferritin: 978 ng/mL — ABNORMAL HIGH (ref 24–336)

## 2019-05-28 LAB — C-REACTIVE PROTEIN: CRP: 4.3 mg/dL — ABNORMAL HIGH (ref <1.0)

## 2019-05-28 LAB — D-DIMER, QUANTITATIVE: D-Dimer, Quant: 0.54 ug/mL-FEU — ABNORMAL HIGH (ref 0.00–0.50)

## 2019-05-28 LAB — CBC WITH DIFFERENTIAL/PLATELET
Abs Immature Granulocytes: 0.05 10*3/uL (ref 0.00–0.07)
Basophils Absolute: 0 10*3/uL (ref 0.0–0.1)
Basophils Relative: 0 %
Eosinophils Absolute: 0 10*3/uL (ref 0.0–0.5)
Eosinophils Relative: 0 %
HCT: 36.8 % — ABNORMAL LOW (ref 39.0–52.0)
Hemoglobin: 12.1 g/dL — ABNORMAL LOW (ref 13.0–17.0)
Immature Granulocytes: 1 %
Lymphocytes Relative: 14 %
Lymphs Abs: 0.7 10*3/uL (ref 0.7–4.0)
MCH: 29 pg (ref 26.0–34.0)
MCHC: 32.9 g/dL (ref 30.0–36.0)
MCV: 88.2 fL (ref 80.0–100.0)
Monocytes Absolute: 0.4 10*3/uL (ref 0.1–1.0)
Monocytes Relative: 8 %
Neutro Abs: 4 10*3/uL (ref 1.7–7.7)
Neutrophils Relative %: 77 %
Platelets: 84 10*3/uL — ABNORMAL LOW (ref 150–400)
RBC: 4.17 MIL/uL — ABNORMAL LOW (ref 4.22–5.81)
RDW: 14.3 % (ref 11.5–15.5)
WBC: 5.1 10*3/uL (ref 4.0–10.5)
nRBC: 0 % (ref 0.0–0.2)

## 2019-05-28 LAB — PROCALCITONIN: Procalcitonin: 2.23 ng/mL

## 2019-05-28 LAB — GLUCOSE, CAPILLARY
Glucose-Capillary: 140 mg/dL — ABNORMAL HIGH (ref 70–99)
Glucose-Capillary: 195 mg/dL — ABNORMAL HIGH (ref 70–99)
Glucose-Capillary: 276 mg/dL — ABNORMAL HIGH (ref 70–99)
Glucose-Capillary: 239 mg/dL — ABNORMAL HIGH (ref 70–99)

## 2019-05-28 MED ORDER — HYDRALAZINE HCL 10 MG PO TABS
10.0000 mg | ORAL_TABLET | Freq: Three times a day (TID) | ORAL | Status: DC
  Administered 2019-05-29 (×2): 10 mg via ORAL
  Filled 2019-05-28 (×3): qty 1

## 2019-05-28 MED ORDER — ATENOLOL 25 MG PO TABS
25.0000 mg | ORAL_TABLET | Freq: Every day | ORAL | Status: DC
  Administered 2019-05-29: 25 mg via ORAL
  Filled 2019-05-28: qty 1

## 2019-05-28 NOTE — Progress Notes (Signed)
PROGRESS NOTE    Blake Herman  UKG:[id number] DOB: 1945/11/10 DOA: 05/25/2019 PCP: Benito Mccreedy, MD    Brief Narrative:  Blake Herman is a 73 year old male with past medical history remarkable for HTN, DM2, thrombocytopenia, questionable left vestibular schwannoma who presented to the ED with cough, brown sputum over the past 2 days.  He also has associated slight fever, loose stools. Pt denies alteration in sense of smell or taste , cp, palp, sob, n/v, abd pain, brbpr, black stool.  In the ED, T 102.5, P 73, R 28, Bp 117/53,  Pox 93% on RA. Wt 81.3kg. Na 138, K 3.4, Bun 54, Creatinne 2.06, Ast 76, Alt 44, Alk phos 53, T. Bili 1.4, Wbc 7.2, Hgb 14.3, Plt 92, D dimer 1.35, Procalcitonin 16.41, LDH 261, Ferritin 1,373, Tg 103, Crp 27.7, Urinalysis prot 30, wbc 6-10, bacteria many. covid Ag positive. Lactic acid 1.7.  Chest x-ray with mild opacification medial left base/retrocardiac region due to atelectasis versus infection. Pt received remdesivir, dexamethasone 58m iv x1, rocephin 1gm iv x 1, and zithromax 5082miv x1 in ED. Pt will be admitted for covid -19, and CAP, and ? Acute lower uti, and Acute renal failure.     Assessment & Plan:   Active Problems:   Diabetes ([facility]  Essential hypertension, benign   COVID-19 virus infection   ARF (acute renal failure) (HCC)   CAP (community acquired pneumonia)   Abnormal liver function   Hypokalemia   Acute hypoxic respiratory failure secondary to acute Covid-19 viral pneumonia during the ongoing 2020 Covid 19 Pandemic, sepsis - POA Patient presenting with cough with associated brown sputum and fever during the previous 2 days.  Was found to have Covid-19/SARS-CoV-2 positive.  Patient with elevated inflammatory markers, elevated procalcitonin, and elevated D-dimer. --Blood cultures x2: No growth x 3 days --CRP 27.7-->7.4-->4.3 --Continue remdesivir, day #4/5 day course --Continue Decadron 6 mg p.o. daily, plan 10 day course --Continue  supplemental oxygen, titrate to maintain SPO2 greater than 92%; currently on room air at rest but desaturated on ambulation yesterday and will need home oxygen. --Continue supportive care with vitamin C, zinc, Tylenol --Follow CBC, CMP, D-dimer, ferritin, and CRP daily --Continue airborne/contact isolation precautions --Likely discharge 05/29/2019 with home health PT/OT, home oxygen, rolling walker and 3 in 1 commode  Community-acquired pneumonia Patient presenting with productive cough with brown sputum.  Fever 102.5 on admission.  Chest x-ray notable for mild opacification medial left base/retrocardiac region.  Procalcitonin elevated at 16.41.  WBC count 7.2. --Urine Legionella and strep pneumo antigen: Pending --Continue antibiotics with azithromycin and ceftriaxone --Follow CBC and procalcitonin daily  Acute renal failure Creatinine 2.06 on admission.  Likely etiology prerenal azotemia from dehydration versus ATN from underlying sepsis as above.  Ultrasound abdomen with no acute findings with no hydronephrosis or mass or obstructive findings bilateral kidneys. --Cr 2.06-->1.29-->1.25-->1.13 --Holding home chlorthalidone, HCTZ, losartan --Continue IVF with NS at 12588mr --Avoid nephrotoxins, renally dose all medications --Follow renal function daily  Hypokalemia Potassium 3.4 on admission, repleted.  Potassium up to 3.7 today. --Monitor electrolytes daily to include magnesium  Transaminitis: Resolved --AST 76-->54-->44-->37 --ALT 44-->36-->29-->27 --Tbili 1.4-->0.7-->0.5 --Acute hepatitis panel negative. --Ultrasound abdomen unrevealing. --Avoid hepatotoxins --Continue to monitor CMP daily  Essential hypertension BP 131/55 this morning, fairly well controlled. --Holding home chlorthalidone and HCTZ and losartan for acute renal failure --Continue atenolol 50 mg p.o. daily --Started on hydralazine 25 mg p.o. every 8 hours --Continue aspirin --Continue monitor blood pressure  closely  Type 2  diabetes mellitus On Metformin 500 mg p.o. twice daily outpatient. --Holding home Metformin secondary to acute renal failure --started on Lantus 10u [place] qHS --Insulin sliding scale for coverage --CBGs qAC/HS  Hypothyroidism: Continue levothyroxine 25 mcg p.o. daily  BPH: Continue tamsulosin 0.4 mg p.o. nightly  Weakness/debility: PT/OT consultation: Recommend home health with PT/OT, 3 and 1 bedside commode and rolling walker on discharge, DME ordered, arrangement per case management   DVT prophylaxis: Lovenox, SCDs Code Status: Full code Family Communication: Updated patient's spouse, [place] via telephone this afternoon Disposition Plan: Continue inpatient, remdesivir, steroids, further dependent on clinical course, likely discharge home on 12/30 following last remdesivir dose.   Consultants:   none  Procedures:   none  Antimicrobials:   Remdesivir 12/26>>  Azithromycin 12/26>>  Ceftriaxone 12/26>>     Subjective: Patient seen and examined at bedside, resting comfortably.  Oxygen now weaned off, but SPO2 remains 90% at rest..  Reports weakness/fatigue and mild shortness of breath.  No other specific complaints this morning.  Denies headache, no current fever, no night sweats/chills, no chest pain, palpitations, no abdominal pain, no congestion, no paresthesias.  No acute events overnight per nursing staff.  Objective: Vitals:   05/28/19 0425 05/28/19 0816 05/28/19 1038 05/28/19 1342  BP: (!) 114/45 (!) 117/55    Pulse: (!) 50 (!) 52  (!) 52  Resp: 20     Temp: 97.7 F (36.5 C)     TempSrc: Oral     SpO2: 92%  91% 93%  Weight:      Height:        Intake/Output Summary (Last 24 hours) at 05/28/2019 1344 Last data filed at 05/28/2019 0815 Gross per 24 hour  Intake 3778.31 ml  Output 2775 ml  Net 1003.31 ml   Filed Weights   05/25/19 2020  Weight: 81.3 kg    Examination:  General exam: Appears calm and comfortable  Respiratory  system: Clear to auscultation. Respiratory effort normal.  On room air.  No accessory muscle use. Cardiovascular system: S1 & S2 heard, RRR. No JVD, murmurs, rubs, gallops or clicks. No pedal edema. Gastrointestinal system: Abdomen is nondistended, soft and nontender. No organomegaly or masses felt. Normal bowel sounds heard. Central nervous system: Alert and oriented. No focal neurological deficits. Extremities: Symmetric 5 x 5 power. Skin: No rashes, lesions or ulcers Psychiatry: Judgement and insight appear normal. Mood & affect appropriate.     Data Reviewed: I have personally reviewed following labs and imaging studies  CBC: Recent Labs  Lab 05/25/19 0901 05/26/19 0348 05/27/19 0345 05/28/19 0318  WBC 7.2 7.6 7.0 5.1  NEUTROABS 6.2 6.2 5.5 4.0  HGB 14.3 14.0 13.0 12.1*  HCT 43.6 42.8 40.4 36.8*  MCV 88.3 89.0 89.0 88.2  PLT 92* 96* 98* 84*   Basic Metabolic Panel: Recent Labs  Lab 05/25/19 0901 05/25/19 2124 05/26/19 0348 05/27/19 0345 05/28/19 0318  NA 138  --  140 138 137  K 3.4*  --  3.7 3.6 3.7  CL 100  --  102 108 105  CO2 25  --  20* 20* 21*  GLUCOSE 212*  --  260* 160* 212*  BUN 54*  --  45* 39* 29*  CREATININE 2.06* 1.62* 1.29* 1.25* 1.13  CALCIUM 9.0  --  7.7* 7.6* 7.8*  MG  --   --   --  2.0  --    GFR: Estimated Creatinine Clearance: 60.2 mL/min (by C-G formula based on SCr of 1.13 mg/dL). Liver Function  Tests: Recent Labs  Lab 05/25/19 0901 05/26/19 0348 05/27/19 0345 05/28/19 0318  AST 76* 54* 44* 37  ALT 44 36 29 27  ALKPHOS 53 40 38 40  BILITOT 1.4* 0.7 0.5 0.5  PROT 7.9 6.7 5.4* 5.5*  ALBUMIN 3.9 3.1* 2.6* 2.6*   No results for input(s): LIPASE, AMYLASE in the last 168 hours. No results for input(s): AMMONIA in the last 168 hours. Coagulation Profile: No results for input(s): INR, PROTIME in the last 168 hours. Cardiac Enzymes: No results for input(s): CKTOTAL, CKMB, CKMBINDEX, TROPONINI in the last 168 hours. BNP (last 3  results) No results for input(s): PROBNP in the last 8760 hours. HbA1C: No results for input(s): HGBA1C in the last 72 hours. CBG: Recent Labs  Lab 05/27/19 1207 05/27/19 1711 05/27/19 2044 05/28/19 0749 05/28/19 1154  GLUCAP 235* 301* 272* 140* 195*   Lipid Profile: No results for input(s): CHOL, HDL, LDLCALC, TRIG, CHOLHDL, LDLDIRECT in the last 72 hours. Thyroid Function Tests: No results for input(s): TSH, T4TOTAL, FREET4, T3FREE, THYROIDAB in the last 72 hours. Anemia Panel: Recent Labs    05/27/19 0345 05/28/19 0318  FERRITIN 1,430* 978*   Sepsis Labs: Recent Labs  Lab 05/25/19 0901 05/27/19 0345 05/28/19 0318  PROCALCITON 16.41 4.90 2.23  LATICACIDVEN 1.7  --   --     Recent Results (from the past 240 hour(s))  Urine culture     Status: Abnormal   Collection Time: 05/25/19  8:40 AM   Specimen: Urine, Clean Catch  Result Value Ref Range Status   Specimen Description   Final    URINE, CLEAN CATCH Performed at [facility], [address] [place], [place] [address]    Special Requests   Final    NONE Performed at [facility], [address] [place], [place] [address]    Culture (A)  Final    <10,000 COLONIES/mL INSIGNIFICANT GROWTH Performed at [facility], [place] [address], [place], [place] [address]    Report Status 05/26/2019 FINAL  Final  Blood Culture (routine x 2)     Status: None (Preliminary result)   Collection Time: 05/25/19  9:01 AM   Specimen: Right Antecubital; Blood  Result Value Ref Range Status   Specimen Description   Final    RIGHT ANTECUBITAL Performed at [facility], [address] [place], [place] [address]    Special Requests   Final    BOTTLES DRAWN AEROBIC AND ANAEROBIC Blood Culture adequate volume Performed at [facility], [address] [place], [place] [address]    Culture   Final    NO GROWTH 3 DAYS Performed at [facility], [place] [address],  [place], [place] [address]    Report Status PENDING  Incomplete  Blood Culture (routine x 2)     Status: None (Preliminary result)   Collection Time: 05/25/19  9:01 AM   Specimen: Left Antecubital; Blood  Result Value Ref Range Status   Specimen Description   Final    LEFT ANTECUBITAL Performed at [facility], [address] [place], [place] [address]    Special Requests   Final    BOTTLES DRAWN AEROBIC AND ANAEROBIC Blood Culture results may not be optimal due to an inadequate volume of blood received in culture bottles Performed at [facility], [address], [place], [place] [address]    Culture   Final  NO GROWTH 3 DAYS Performed at [facility], [place] [address] [place], [place] [address]    Report Status PENDING  Incomplete  SARS Coronavirus 2 Ag (30 min TAT) - Urine, Clean Catch     Status: Abnormal   Collection Time: 05/25/19  9:02 AM   Specimen: Urine, Clean Catch; Nasal Swab  Result Value Ref Range Status   SARS Coronavirus 2 Ag POSITIVE (A) NEGATIVE Final    Comment: RESULT CALLED TO, READ BACK BY AND VERIFIED WITH: CALLED TO C.REED RN AT [place] ON [phone] (NOTE) SARS-CoV-2 antigen PRESENT. Positive results indicate the presence of viral antigens, but clinical correlation with patient history and other diagnostic information is necessary to determine patient infection status.  Positive results do not rule out bacterial infection or co-infection  with other viruses. False positive results are rare but can occur, and confirmatory RT-PCR testing may be appropriate in some circumstances. The expected result is Negative. Fact Sheet for Patients: PodPark.tn Fact Sheet for Providers: GiftContent.is  This test is not yet approved or cleared by the [place] FDA and  has been authorized for detection and/or diagnosis of SARS-CoV-2 by FDA under an Emergency Use Authorization (EUA).   This EUA will remain in effect (meaning this test can be used) for the duration of  the COVID- 19 declaration under Section 564(b)(1) of the Act, 21 U.S.C. section 360bbb-3(b)(1), unless the authorization is terminated or revoked sooner. Performed at [facility], [address], [place], [place] [address]          Radiology Studies: No results found.      Scheduled Meds: . aspirin EC  81 mg Oral Daily  . atenolol  50 mg Oral Daily  . azithromycin  500 mg Oral Daily  . dexamethasone (DECADRON) injection  6 mg Intravenous Q24H  . enoxaparin (LOVENOX) injection  40 mg Subcutaneous Q24H  . hydrALAZINE  25 mg Oral Q8H  . insulin aspart  0-5 Units Subcutaneous QHS  . insulin aspart  0-9 Units Subcutaneous TID WC  . insulin glargine  10 Units Subcutaneous QHS  . levothyroxine  25 mcg Oral QAC breakfast  . tamsulosin  0.4 mg Oral Daily   Continuous Infusions: . sodium chloride Stopped (05/25/19 1824)  . cefTRIAXone (ROCEPHIN)  IV 2 g (05/28/19 1017)  . remdesivir 100 mg in NS 100 mL 100 mg (05/28/19 0821)     LOS: 3 days    Time spent: 34 minutes spent on chart review, discussion with nursing staff, consultants, updating family and interview/physical exam; more than 50% of that time was spent in counseling and/or coordination of care.    Drayden Lukas J [place] DO [facility] 05/28/2019, 1:44 PM

## 2019-05-28 NOTE — TOC Progression Note (Signed)
Transition of Care [facility]) - Progression Note    Patient Details  Name: Blake Herman MRN: [id number] Date of Birth: 25-Jun-1945  [facility] CM/SW Contact  Purcell Mouton, RN Phone Number: 05/28/2019, 1:30 PM  Clinical Narrative:    [facility] will follow pt at discharge for HHPT. Referral given to in house rep.         Expected Discharge Plan and Services                                                 Social Determinants of Health (SDOH) Interventions    Readmission Risk Interventions No flowsheet data found.

## 2019-05-28 NOTE — Care Management Important Message (Signed)
Important Message  Patient Details IM Letter given to Gabriel Earing RN Case Manager to present to the Patient Name: Blake Herman MRN: [id number] Date of Birth: [birth year]   Medicare Important Message Given:  Yes     Kerin Salen 05/28/2019, 11:11 AM

## 2019-05-28 NOTE — Progress Notes (Signed)
[employer] delivered equipment to patient door. RN gave equipment to patient.    Patient understanding of equipment use and understands that the equipment is for him to keep.    [place], Therapist, sports

## 2019-05-29 LAB — COMPREHENSIVE METABOLIC PANEL
ALT: 23 U/L (ref 0–44)
AST: 37 U/L (ref 15–41)
Albumin: 2.8 g/dL — ABNORMAL LOW (ref 3.5–5.0)
Alkaline Phosphatase: 46 U/L (ref 38–126)
Anion gap: 10 (ref 5–15)
BUN: 27 mg/dL — ABNORMAL HIGH (ref 8–23)
CO2: 21 mmol/L — ABNORMAL LOW (ref 22–32)
Calcium: 7.9 mg/dL — ABNORMAL LOW (ref 8.9–10.3)
Chloride: 107 mmol/L (ref 98–111)
Creatinine, Ser: 1.15 mg/dL (ref 0.61–1.24)
GFR calc Af Amer: >60 mL/min (ref >60)
GFR calc non Af Amer: >60 mL/min (ref >60)
Glucose, Bld: 188 mg/dL — ABNORMAL HIGH (ref 70–99)
Potassium: 3.9 mmol/L (ref 3.5–5.1)
Sodium: 138 mmol/L (ref 135–145)
Total Bilirubin: 0.8 mg/dL (ref 0.3–1.2)
Total Protein: 6 g/dL — ABNORMAL LOW (ref 6.5–8.1)

## 2019-05-29 LAB — CBC WITH DIFFERENTIAL/PLATELET
Abs Immature Granulocytes: 0.16 10*3/uL — ABNORMAL HIGH (ref 0.00–0.07)
Basophils Absolute: 0 10*3/uL (ref 0.0–0.1)
Basophils Relative: 0 %
Eosinophils Absolute: 0 10*3/uL (ref 0.0–0.5)
Eosinophils Relative: 0 %
HCT: 38.2 % — ABNORMAL LOW (ref 39.0–52.0)
Hemoglobin: 13.2 g/dL (ref 13.0–17.0)
Immature Granulocytes: 3 %
Lymphocytes Relative: 12 %
Lymphs Abs: 0.7 10*3/uL (ref 0.7–4.0)
MCH: 29 pg (ref 26.0–34.0)
MCHC: 34.6 g/dL (ref 30.0–36.0)
MCV: 84 fL (ref 80.0–100.0)
Monocytes Absolute: 0.4 10*3/uL (ref 0.1–1.0)
Monocytes Relative: 6 %
Neutro Abs: 4.8 10*3/uL (ref 1.7–7.7)
Neutrophils Relative %: 79 %
Platelets: 71 10*3/uL — ABNORMAL LOW (ref 150–400)
RBC: 4.55 MIL/uL (ref 4.22–5.81)
RDW: 13.7 % (ref 11.5–15.5)
WBC: 6.1 10*3/uL (ref 4.0–10.5)
nRBC: 0 % (ref 0.0–0.2)

## 2019-05-29 LAB — GLUCOSE, CAPILLARY
Glucose-Capillary: 139 mg/dL — ABNORMAL HIGH (ref 70–99)
Glucose-Capillary: 194 mg/dL — ABNORMAL HIGH (ref 70–99)

## 2019-05-29 LAB — D-DIMER, QUANTITATIVE: D-Dimer, Quant: 0.8 ug/mL-FEU — ABNORMAL HIGH (ref 0.00–0.50)

## 2019-05-29 LAB — C-REACTIVE PROTEIN: CRP: 3.8 mg/dL — ABNORMAL HIGH (ref <1.0)

## 2019-05-29 LAB — FERRITIN: Ferritin: 843 ng/mL — ABNORMAL HIGH (ref 24–336)

## 2019-05-29 MED ORDER — CEFDINIR 300 MG PO CAPS: 300.0000 mg | ORAL_CAPSULE | Freq: Two times a day (BID) | ORAL | 0 refills | Status: AC

## 2019-05-29 MED ORDER — GUAIFENESIN-DM 100-10 MG/5ML PO SYRP
5.0000 mL | ORAL_SOLUTION | ORAL | Status: DC | PRN
  Administered 2019-05-29: 5 mL via ORAL
  Filled 2019-05-29: qty 10

## 2019-05-29 MED ORDER — ATENOLOL 25 MG PO TABS: 25.0000 mg | ORAL_TABLET | Freq: Every day | ORAL | 0 refills | Status: DC

## 2019-05-29 MED ORDER — GUAIFENESIN-DM 100-10 MG/5ML PO SYRP: 5.0000 mL | ORAL_SOLUTION | ORAL | 0 refills | Status: DC | PRN

## 2019-05-29 MED ORDER — DEXAMETHASONE 6 MG PO TABS: 6.0000 mg | ORAL_TABLET | Freq: Every day | ORAL | 0 refills | Status: AC

## 2019-05-29 NOTE — Discharge Instructions (Signed)
10 Things You Can Do to Manage Your COVID-19 Symptoms at Home If you have possible or confirmed COVID-19: 1. Stay home from work and school. And stay away from other public places. If you must go out, avoid using any kind of public transportation, ridesharing, or taxis. 2. Monitor your symptoms carefully. If your symptoms get worse, call your healthcare provider immediately. 3. Get rest and stay hydrated. 4. If you have a medical appointment, call the healthcare provider ahead of time and tell them that you have or may have COVID-19. 5. For medical emergencies, call 911 and notify the dispatch personnel that you have or may have COVID-19. 6. Cover your cough and sneezes with a tissue or use the inside of your elbow. 7. Wash your hands often with soap and water for at least 20 seconds or clean your hands with an alcohol-based hand sanitizer that contains at least 60% alcohol. 8. As much as possible, stay in a specific room and away from other people in your home. Also, you should use a separate bathroom, if available. If you need to be around other people in or outside of the home, wear a mask. 9. Avoid sharing personal items with other people in your household, like dishes, towels, and bedding. 10. Clean all surfaces that are touched often, like counters, tabletops, and doorknobs. Use household cleaning sprays or wipes according to the label instructions. [email or web address] 11/28/2018 This information is not intended to replace advice given to you by your health care provider. Make sure you discuss any questions you have with your health care provider. Document Revised: 05/02/2019 Document Reviewed: 05/02/2019 Elsevier Patient Education  [place].   Acute Kidney Injury, Adult  Acute kidney injury is a sudden worsening of kidney function. The kidneys are organs that have several jobs. They filter the blood to remove waste products and extra fluid. They also maintain a healthy balance  of minerals and hormones in the body, which helps control blood pressure and keep bones strong. With this condition, your kidneys do not do their jobs as well as they should. This condition ranges from mild to severe. Over time it may develop into long-lasting (chronic) kidney disease. Early detection and treatment may prevent acute kidney injury from developing into a chronic condition. What are the causes? Common causes of this condition include:  A problem with blood flow to the kidneys. This may be caused by: ? Low blood pressure (hypotension) or shock. ? Blood loss. ? Heart and blood vessel (cardiovascular) disease. ? Severe burns. ? Liver disease.  Direct damage to the kidneys. This may be caused by: ? Certain medicines. ? A kidney infection. ? Poisoning. ? Being around or in contact with toxic substances. ? A surgical wound. ? A hard, direct hit to the kidney area.  A sudden blockage of urine flow. This may be caused by: ? Cancer. ? Kidney stones. ? An enlarged prostate in males. What are the signs or symptoms? Symptoms of this condition may not be obvious until the condition becomes severe. Symptoms of this condition can include:  Tiredness (lethargy), or difficulty staying awake.  Nausea or vomiting.  Swelling (edema) of the face, legs, ankles, or feet.  Problems with urination, such as: ? Abdominal pain, or pain along the side of your stomach (flank). ? Decreased urine production. ? Decrease in the force of urine flow.  Muscle twitches and cramps, especially in the legs.  Confusion or trouble concentrating.  Loss of appetite.  Fever. How is  this diagnosed? This condition may be diagnosed with tests, including:  Blood tests.  Urine tests.  Imaging tests.  A test in which a sample of tissue is removed from the kidneys to be examined under a microscope (kidney biopsy). How is this treated? Treatment for this condition depends on the cause and how severe  the condition is. In mild cases, treatment may not be needed. The kidneys may heal on their own. In more severe cases, treatment will involve:  Treating the cause of the kidney injury. This may involve changing any medicines you are taking or adjusting your dosage.  Fluids. You may need specialized IV fluids to balance your body's needs.  Having a catheter placed to drain urine and prevent blockages.  Preventing problems from occurring. This may mean avoiding certain medicines or procedures that can cause further injury to the kidneys. In some cases treatment may also require:  A procedure to remove toxic wastes from the body (dialysis or continuous renal replacement therapy - CRRT).  Surgery. This may be done to repair a torn kidney, or to remove the blockage from the urinary system. Follow these instructions at home: Medicines  Take over-the-counter and prescription medicines only as told by your health care provider.  Do not take any new medicines without your health care provider's approval. Many medicines can worsen your kidney damage.  Do not take any vitamin and mineral supplements without your health care provider's approval. Many nutritional supplements can worsen your kidney damage. Lifestyle  If your health care provider prescribed changes to your diet, follow them. You may need to decrease the amount of protein you eat.  Achieve and maintain a healthy weight. If you need help with this, ask your health care provider.  Start or continue an exercise plan. Try to exercise at least 30 minutes a day, 5 days a week.  Do not use any tobacco products, such as cigarettes, chewing tobacco, and e-cigarettes. If you need help quitting, ask your health care provider. General instructions  Keep track of your blood pressure. Report changes in your blood pressure as told by your health care provider.  Stay up to date with immunizations. Ask your health care provider which immunizations  you need.  Keep all follow-up visits as told by your health care provider. This is important. Where to find more information  American Association of Kidney Patients: BombTimer.gl  National Kidney Foundation: www.kidney.[place]: https://mathis.com/  Life Options [facility]: ? www.lifeoptions.org ? www.kidneyschool.org Contact a health care provider if:  Your symptoms get worse.  You develop new symptoms. Get help right away if:  You develop symptoms of worsening kidney disease, which include: ? Headaches. ? Abnormally dark or light skin. ? Easy bruising. ? Frequent hiccups. ? Chest pain. ? Shortness of breath. ? End of menstruation in women. ? Seizures. ? Confusion or altered mental status. ? Abdominal or back pain. ? Itchiness.  You have a fever.  Your body is producing less urine.  You have pain or bleeding when you urinate. Summary  Acute kidney injury is a sudden worsening of kidney function.  Acute kidney injury can be caused by problems with blood flow to the kidneys, direct damage to the kidneys, and sudden blockage of urine flow.  Symptoms of this condition may not be obvious until it becomes severe. Symptoms may include edema, lethargy, confusion, nausea or vomiting, and problems passing urine.  This condition can usually be diagnosed with blood tests, urine tests, and imaging  tests. Sometimes a kidney biopsy is done to diagnose this condition.  Treatment for this condition often involves treating the underlying cause. It is treated with fluids, medicines, dialysis, diet changes, or surgery. This information is not intended to replace advice given to you by your health care provider. Make sure you discuss any questions you have with your health care provider. Document Released: 11/29/2010 Document Revised: 04/28/2017 Document Reviewed: 05/06/2016 Elsevier Patient Education  2020 [place].   COVID-19 COVID-19 is a  respiratory infection that is caused by a virus called severe acute respiratory syndrome coronavirus 2 (SARS-CoV-2). The disease is also known as coronavirus disease or novel coronavirus. In some people, the virus may not cause any symptoms. In others, it may cause a serious infection. The infection can get worse quickly and can lead to complications, such as:  Pneumonia, or infection of the lungs.  Acute respiratory distress syndrome or ARDS. This is fluid build-up in the lungs.  Acute respiratory failure. This is a condition in which there is not enough oxygen passing from the lungs to the body.  Sepsis or septic shock. This is a serious bodily reaction to an infection.  Blood clotting problems.  Secondary infections due to bacteria or fungus. The virus that causes COVID-19 is contagious. This means that it can spread from person to person through droplets from coughs and sneezes (respiratory secretions). What are the causes? This illness is caused by a virus. You may catch the virus by:  Breathing in droplets from an infected person's cough or sneeze.  Touching something, like a table or a doorknob, that was exposed to the virus (contaminated) and then touching your mouth, nose, or eyes. What increases the risk? Risk for infection You are more likely to be infected with this virus if you:  Live in or travel to an area with a COVID-19 outbreak.  Come in contact with a sick person who recently traveled to an area with a COVID-19 outbreak.  Provide care for or live with a person who is infected with COVID-19. Risk for serious illness You are more likely to become seriously ill from the virus if you:  Are 24 years of age or older.  Have a long-term disease that lowers your body's ability to fight infection (immunocompromised).  Live in a nursing home or long-term care facility.  Have a long-term (chronic) disease such as: ? Chronic lung disease, including chronic obstructive  pulmonary disease or asthma ? Heart disease. ? Diabetes. ? Chronic kidney disease. ? Liver disease.  Are obese. What are the signs or symptoms? Symptoms of this condition can range from mild to severe. Symptoms may appear any time from 2 to 14 days after being exposed to the virus. They include:  A fever.  A cough.  Difficulty breathing.  Chills.  Muscle pains.  A sore throat.  Loss of taste or smell. Some people may also have stomach problems, such as nausea, vomiting, or diarrhea. Other people may not have any symptoms of COVID-19. How is this diagnosed? This condition may be diagnosed based on:  Your signs and symptoms, especially if: ? You live in an area with a COVID-19 outbreak. ? You recently traveled to or from an area where the virus is common. ? You provide care for or live with a person who was diagnosed with COVID-19.  A physical exam.  Lab tests, which may include: ? A nasal swab to take a sample of fluid from your nose. ? A throat swab  to take a sample of fluid from your throat. ? A sample of mucus from your lungs (sputum). ? Blood tests.  Imaging tests, which may include, X-rays, CT scan, or ultrasound. How is this treated? At present, there is no medicine to treat COVID-19. Medicines that treat other diseases are being used on a trial basis to see if they are effective against COVID-19. Your health care provider will talk with you about ways to treat your symptoms. For most people, the infection is mild and can be managed at home with rest, fluids, and over-the-counter medicines. Treatment for a serious infection usually takes places in a hospital intensive care unit (ICU). It may include one or more of the following treatments. These treatments are given until your symptoms improve.  Receiving fluids and medicines through an IV.  Supplemental oxygen. Extra oxygen is given through a tube in the nose, a face mask, or a hood.  Positioning you to lie on  your stomach (prone position). This makes it easier for oxygen to get into the lungs.  Continuous positive airway pressure (CPAP) or bi-level positive airway pressure (BPAP) machine. This treatment uses mild air pressure to keep the airways open. A tube that is connected to a motor delivers oxygen to the body.  Ventilator. This treatment moves air into and out of the lungs by using a tube that is placed in your windpipe.  Tracheostomy. This is a procedure to create a hole in the neck so that a breathing tube can be inserted.  Extracorporeal membrane oxygenation (ECMO). This procedure gives the lungs a chance to recover by taking over the functions of the heart and lungs. It supplies oxygen to the body and removes carbon dioxide. Follow these instructions at home: Lifestyle  If you are sick, stay home except to get medical care. Your health care provider will tell you how long to stay home. Call your health care provider before you go for medical care.  Rest at home as told by your health care provider.  Do not use any products that contain nicotine or tobacco, such as cigarettes, e-cigarettes, and chewing tobacco. If you need help quitting, ask your health care provider.  Return to your normal activities as told by your health care provider. Ask your health care provider what activities are safe for you. General instructions  Take over-the-counter and prescription medicines only as told by your health care provider.  Drink enough fluid to keep your urine pale yellow.  Keep all follow-up visits as told by your health care provider. This is important. How is this prevented?  There is no vaccine to help prevent COVID-19 infection. However, there are steps you can take to protect yourself and others from this virus. To protect yourself:   Do not travel to areas where COVID-19 is a risk. The areas where COVID-19 is reported change often. To identify high-risk areas and travel restrictions,  check the CDC travel website: FatFares.com.br  If you live in, or must travel to, an area where COVID-19 is a risk, take precautions to avoid infection. ? Stay away from people who are sick. ? Wash your hands often with soap and water for 20 seconds. If soap and water are not available, use an alcohol-based hand sanitizer. ? Avoid touching your mouth, face, eyes, or nose. ? Avoid going out in public, follow guidance from your state and local health authorities. ? If you must go out in public, wear a cloth face covering or face mask. ? Disinfect  objects and surfaces that are frequently touched every day. This may include:  Counters and tables.  Doorknobs and light switches.  Sinks and faucets.  Electronics, such as phones, remote controls, keyboards, computers, and tablets. To protect others: If you have symptoms of COVID-19, take steps to prevent the virus from spreading to others.  If you think you have a COVID-19 infection, contact your health care provider right away. Tell your health care team that you think you may have a COVID-19 infection.  Stay home. Leave your house only to seek medical care. Do not use public transport.  Do not travel while you are sick.  Wash your hands often with soap and water for 20 seconds. If soap and water are not available, use alcohol-based hand sanitizer.  Stay away from other members of your household. Let healthy household members care for children and pets, if possible. If you have to care for children or pets, wash your hands often and wear a mask. If possible, stay in your own room, separate from others. Use a different bathroom.  Make sure that all people in your household wash their hands well and often.  Cough or sneeze into a tissue or your sleeve or elbow. Do not cough or sneeze into your hand or into the air.  Wear a cloth face covering or face mask. Where to find more information  Centers for Disease Control and  Prevention: PurpleGadgets.be  World Health Organization: https://www.castaneda.info/ Contact a health care provider if:  You live in or have traveled to an area where COVID-19 is a risk and you have symptoms of the infection.  You have had contact with someone who has COVID-19 and you have symptoms of the infection. Get help right away if:  You have trouble breathing.  You have pain or pressure in your chest.  You have confusion.  You have bluish lips and fingernails.  You have difficulty waking from sleep.  You have symptoms that get worse. These symptoms may represent a serious problem that is an emergency. Do not wait to see if the symptoms will go away. Get medical help right away. Call your local emergency services (911 in the [place] Do not drive yourself to the hospital. Let the emergency medical personnel know if you think you have COVID-19. Summary  COVID-19 is a respiratory infection that is caused by a virus. It is also known as coronavirus disease or novel coronavirus. It can cause serious infections, such as pneumonia, acute respiratory distress syndrome, acute respiratory failure, or sepsis.  The virus that causes COVID-19 is contagious. This means that it can spread from person to person through droplets from coughs and sneezes.  You are more likely to develop a serious illness if you are 58 years of age or older, have a weak immunity, live in a nursing home, or have chronic disease.  There is no medicine to treat COVID-19. Your health care provider will talk with you about ways to treat your symptoms.  Take steps to protect yourself and others from infection. Wash your hands often and disinfect objects and surfaces that are frequently touched every day. Stay away from people who are sick and wear a mask if you are sick. This information is not intended to replace advice given to you by your health care provider. Make sure you  discuss any questions you have with your health care provider. Document Released: 06/21/2018 Document Revised: 10/11/2018 Document Reviewed: 06/21/2018 Elsevier Patient Education  2020 [employer].  COVID-19:  How to Protect Yourself and Others Know how it spreads  There is currently no vaccine to prevent coronavirus disease 2019 (COVID-19).  The best way to prevent illness is to avoid being exposed to this virus.  The virus is thought to spread mainly from person-to-person. ? Between people who are in close contact with one another (within about 6 feet). ? Through respiratory droplets produced when an infected person coughs, sneezes or talks. ? These droplets can land in the mouths or noses of people who are nearby or possibly be inhaled into the lungs. ? Some recent studies have suggested that COVID-19 may be spread by people who are not showing symptoms. Everyone should Clean your hands often  Wash your hands often with soap and water for at least 20 seconds especially after you have been in a public place, or after blowing your nose, coughing, or sneezing.  If soap and water are not readily available, use a hand sanitizer that contains at least 60% alcohol. Cover all surfaces of your hands and rub them together until they feel dry.  Avoid touching your eyes, nose, and mouth with unwashed hands. Avoid close contact  Stay home if you are sick.  Avoid close contact with people who are sick.  Put distance between yourself and other people. ? Remember that some people without symptoms may be able to spread virus. ? This is especially important for people who are at higher risk of getting very GainPain.com.cy Cover your mouth and nose with a cloth face cover when around others  You could spread COVID-19 to others even if you do not feel sick.  Everyone should wear a cloth face cover when they have to go out  in public, for example to the grocery store or to pick up other necessities. ? Cloth face coverings should not be placed on young children under age 87, anyone who has trouble breathing, or is unconscious, incapacitated or otherwise unable to remove the mask without assistance.  The cloth face cover is meant to protect other people in case you are infected.  Do NOT use a facemask meant for a Dietitian.  Continue to keep about 6 feet between yourself and others. The cloth face cover is not a substitute for social distancing. Cover coughs and sneezes  If you are in a private setting and do not have on your cloth face covering, remember to always cover your mouth and nose with a tissue when you cough or sneeze or use the inside of your elbow.  Throw used tissues in the trash.  Immediately wash your hands with soap and water for at least 20 seconds. If soap and water are not readily available, clean your hands with a hand sanitizer that contains at least 60% alcohol. Clean and disinfect  Clean AND disinfect frequently touched surfaces daily. This includes tables, doorknobs, light switches, countertops, handles, desks, phones, keyboards, toilets, faucets, and sinks. RackRewards.fr  If surfaces are dirty, clean them: Use detergent or soap and water prior to disinfection.  Then, use a household disinfectant. You can see a list of EPA-registered household disinfectants here. [email or web address] 10/02/2018 This information is not intended to replace advice given to you by your health care provider. Make sure you discuss any questions you have with your health care provider. Document Released: 09/11/2018 Document Revised: 10/10/2018 Document Reviewed: 09/11/2018 Elsevier Patient Education  2020 [place] Under Monitoring Name: Blake Herman  Location: [address] [place] [address]  Infection  Prevention Recommendations for Individuals Confirmed to have, or Being Evaluated for, 2019 Novel Coronavirus (COVID-19) Infection Who Receive Care at Home  Individuals who are confirmed to have, or are being evaluated for, COVID-19 should follow the prevention steps below until a healthcare provider or local or state health department says they can return to normal activities.  Stay home except to get medical care You should restrict activities outside your home, except for getting medical care. Do not go to work, school, or public areas, and do not use public transportation or taxis.  Call ahead before visiting your doctor Before your medical appointment, call the healthcare provider and tell them that you have, or are being evaluated for, COVID-19 infection. This will help the healthcare provider's office take steps to keep other people from getting infected. Ask your healthcare provider to call the local or state health department.  Monitor your symptoms Seek prompt medical attention if your illness is worsening (e.g., difficulty breathing). Before going to your medical appointment, call the healthcare provider and tell them that you have, or are being evaluated for, COVID-19 infection. Ask your healthcare provider to call the local or state health department.  Wear a facemask You should wear a facemask that covers your nose and mouth when you are in the same room with other people and when you visit a healthcare provider. People who live with or visit you should also wear a facemask while they are in the same room with you.  Separate yourself from other people in your home As much as possible, you should stay in a different room from other people in your home. Also, you should use a separate bathroom, if available.  Avoid sharing household items You should not share dishes, drinking glasses, cups, eating utensils, towels, bedding, or other items with other people in your home.  After using these items, you should wash them thoroughly with soap and water.  Cover your coughs and sneezes Cover your mouth and nose with a tissue when you cough or sneeze, or you can cough or sneeze into your sleeve. Throw used tissues in a lined trash can, and immediately wash your hands with soap and water for at least 20 seconds or use an alcohol-based hand rub.  Wash your [facility] your hands often and thoroughly with soap and water for at least 20 seconds. You can use an alcohol-based hand sanitizer if soap and water are not available and if your hands are not visibly dirty. Avoid touching your eyes, nose, and mouth with unwashed hands.   Prevention Steps for Caregivers and Household Members of Individuals Confirmed to have, or Being Evaluated for, COVID-19 Infection Being Cared for in the Home  If you live with, or provide care at home for, a person confirmed to have, or being evaluated for, COVID-19 infection please follow these guidelines to prevent infection:  Follow healthcare provider's instructions Make sure that you understand and can help the patient follow any healthcare provider instructions for all care.  Provide for the patient's basic needs You should help the patient with basic needs in the home and provide support for getting groceries, prescriptions, and other personal needs.  Monitor the patient's symptoms If they are getting sicker, call his or her medical provider and tell them that the patient has, or is being evaluated for, COVID-19 infection. This will help the healthcare provider's office take steps to keep other people from getting infected. Ask the healthcare provider to call the local or  state health department.  Limit the number of people who have contact with the patient  If possible, have only one caregiver for the patient.  Other household members should stay in another home or place of residence. If this is not possible, they should  stay  in another room, or be separated from the patient as much as possible. Use a separate bathroom, if available.  Restrict visitors who do not have an essential need to be in the home.  Keep older adults, very young children, and other sick people away from the patient Keep older adults, very young children, and those who have compromised immune systems or chronic health conditions away from the patient. This includes people with chronic heart, lung, or kidney conditions, diabetes, and cancer.  Ensure good ventilation Make sure that shared spaces in the home have good air flow, such as from an air conditioner or an opened window, weather permitting.  Wash your hands often  Wash your hands often and thoroughly with soap and water for at least 20 seconds. You can use an alcohol based hand sanitizer if soap and water are not available and if your hands are not visibly dirty.  Avoid touching your eyes, nose, and mouth with unwashed hands.  Use disposable paper towels to dry your hands. If not available, use dedicated cloth towels and replace them when they become wet.  Wear a facemask and gloves  Wear a disposable facemask at all times in the room and gloves when you touch or have contact with the patient's blood, body fluids, and/or secretions or excretions, such as sweat, saliva, sputum, nasal mucus, vomit, urine, or feces.  Ensure the mask fits over your nose and mouth tightly, and do not touch it during use.  Throw out disposable facemasks and gloves after using them. Do not reuse.  Wash your hands immediately after removing your facemask and gloves.  If your personal clothing becomes contaminated, carefully remove clothing and launder. Wash your hands after handling contaminated clothing.  Place all used disposable facemasks, gloves, and other waste in a lined container before disposing them with other household waste.  Remove gloves and wash your hands immediately after handling  these items.  Do not share dishes, glasses, or other household items with the patient  Avoid sharing household items. You should not share dishes, drinking glasses, cups, eating utensils, towels, bedding, or other items with a patient who is confirmed to have, or being evaluated for, COVID-19 infection.  After the person uses these items, you should wash them thoroughly with soap and water.  Wash laundry thoroughly  Immediately remove and wash clothes or bedding that have blood, body fluids, and/or secretions or excretions, such as sweat, saliva, sputum, nasal mucus, vomit, urine, or feces, on them.  Wear gloves when handling laundry from the patient.  Read and follow directions on labels of laundry or clothing items and detergent. In general, wash and dry with the warmest temperatures recommended on the label.  Clean all areas the individual has used often  Clean all touchable surfaces, such as counters, tabletops, doorknobs, bathroom fixtures, toilets, phones, keyboards, tablets, and bedside tables, every day. Also, clean any surfaces that may have blood, body fluids, and/or secretions or excretions on them.  Wear gloves when cleaning surfaces the patient has come in contact with.  Use a diluted bleach solution (e.g., dilute bleach with 1 part bleach and 10 parts water) or a household disinfectant with a label that says EPA-registered for coronaviruses. To  make a bleach solution at home, add 1 tablespoon of bleach to 1 quart (4 cups) of water. For a larger supply, add  cup of bleach to 1 gallon (16 cups) of water.  Read labels of cleaning products and follow recommendations provided on product labels. Labels contain instructions for safe and effective use of the cleaning product including precautions you should take when applying the product, such as wearing gloves or eye protection and making sure you have good ventilation during use of the product.  Remove gloves and wash hands  immediately after cleaning.  Monitor yourself for signs and symptoms of illness Caregivers and household members are considered close contacts, should monitor their health, and will be asked to limit movement outside of the home to the extent possible. Follow the monitoring steps for close contacts listed on the symptom monitoring form.   ? If you have additional questions, contact your local health department or call the epidemiologist on call at [phone] (available 24/7). ? This guidance is subject to change. For the most up-to-date guidance from [facility], please refer to their website: [email or web address]

## 2019-05-29 NOTE — Discharge Summary (Signed)
Physician Discharge Summary  Blake Herman OEH:[id number] DOB: 04/09/1946 DOA: 05/25/2019  PCP: Blake Mccreedy, MD  Admit date: 05/25/2019 Discharge date: 05/29/2019  Admitted From: Home Disposition: Home  Recommendations for Outpatient Follow-up:  1. Follow up with PCP in 1-2 weeks 2. Please obtain BMP in one week to assess renal function 3. Decreased dose of atenolol from 50 mg to 25 mg p.o. daily for bradycardia 4. Discontinue chlorthalidone 5. Completed 5-day course of remdesivir inpatient for Covid-19 viral pneumonia 6. Continue cefdinir outpatient to complete 10-day course for bacterial pneumonia component 7. Continue Decadron 6 mg p.o. daily to complete 10-day steroid course  Home Health: Yes, PT/OT Equipment/Devices: Oxygen, 2 L per nasal cannula  Discharge Condition: Stable CODE STATUS: Full code Diet recommendation: Cardiac/consistent carbohydrate diet  History of present illness:  Blake Herman is a 72 year old male with past medical history remarkable for HTN, DM2, thrombocytopenia, questionable left vestibular schwannoma who presented to the ED with cough, brown sputum over the past 2 days.  He also has associated slight fever, loose stools. Pt denies alteration in sense of smell or taste , cp, palp, sob, n/v, abd pain, brbpr, black stool.  In the ED, T 102.5, P 73, R 28, Bp 117/53, Pox 93% on RA. Wt 81.3kg. Na 138, K 3.4, Bun 54, Creatinne 2.06, Ast 76, Alt 44, Alk phos 53, T. Bili 1.4, Wbc 7.2, Hgb 14.3, Plt 92, D dimer 1.35, Procalcitonin 16.41, LDH 261, Ferritin 1,373, Tg 103, Crp 27.7, Urinalysis prot 30, wbc 6-10, bacteria many. covid Ag positive. Lactic acid 1.7.  Chest x-ray with mild opacification medial left base/retrocardiac region due to atelectasis versus infection. Pt received remdesivir, dexamethasone 34m iv x1, rocephin 1gm iv x 1, and zithromax 509miv x1 in ED. Pt will be admitted for covid -19, and CAP, and ? Acute lower uti, and Acute renal  failure.  Hospital course: Acute hypoxic respiratory failure secondary to acute Covid-19 viral pneumonia during the ongoing 2020 Covid 19 Pandemic, sepsis - POA Patient presenting with cough with associated brown sputum and fever during the previous 2 days.  Was found to have Covid-19/SARS-CoV-2 positive.  Patient with elevated inflammatory markers, elevated procalcitonin, and elevated D-dimer.  Patient was started on remdesivir and completed 5-day course.  We will continue Decadron 6 mg p.o. daily to complete a 10-day course outpatient.  Patient with continued hypoxia on ambulation, although improved; will discharge home on supplemental oxygen at 2 L per nasal cannula with associated home health therapy with PT/OT, rolling walker in 3 and 1 bedside commode.  Community-acquired pneumonia Patient presenting with productive cough with brown sputum.  Fever 102.5 on admission.  Chest x-ray notable for mild opacification medial left base/retrocardiac region.  Procalcitonin elevated at 16.41.  WBC count 7.2.  Completed 5-day course of azithromycin, received 5-day course of ceftriaxone and will continue with the cefdinir 300 mg p.o. twice daily for additional 5 days for total course of 10 days.    Acute renal failure Creatinine 2.06 on admission.  Likely etiology prerenal azotemia from dehydration versus ATN from underlying sepsis as above.  Ultrasound abdomen with no acute findings with no hydronephrosis or mass or obstructive findings bilateral kidneys.  Patient's home chlorthalidone, HCTZ and losartan were held during the hospitalization.  He was supported with IV fluid for increased hydration.  Creatinine improved to 1.15 at time of discharge.  Will discontinue his home chlorthalidone, may resume home HCTZ and losartan.  Recommend repeat BMP in 1 week.  Hypokalemia Repleted during  hospitalization.  Potassium 3.8 at time of discharge.  Transaminitis: Resolved Etiology likely secondary to acute  Covid-19 infection associated with hepatic impairment.  Acute hepatitis panel negative, ultrasound abdomen unrevealing.  LFTs elevation resolved prior to discharge.  Essential hypertension Patient's home chlorthalidone was discontinued secondary to acute renal failure and borderline hypotension.  Also atenolol dose was reduced from 50 mg to 25 mg p.o. daily secondary to bradycardia.  We will continue home HCTZ and losartan.    Type 2 diabetes mellitus Continue home metformin 500 mg p.o. twice daily.  Hypothyroidism: Continue levothyroxine 25 mcg p.o. daily  BPH: Continue tamsulosin 0.4 mg p.o. nightly  Weakness/debility: PT/OT consultation: Recommend home health with PT/OT, 3 and 1 bedside commode and rolling walker on discharge, DME ordered, arrangement per case management.   Discharge Diagnoses:  Active Problems:   Diabetes (Blake Herman)   Essential hypertension, benign   COVID-19 virus infection   CAP (community acquired pneumonia)    Discharge Instructions  Discharge Instructions    Call MD for:  difficulty breathing, headache or visual disturbances   Complete by: As directed    Call MD for:  extreme fatigue   Complete by: As directed    Call MD for:  persistant dizziness or light-headedness   Complete by: As directed    Call MD for:  persistant nausea and vomiting   Complete by: As directed    Call MD for:  severe uncontrolled pain   Complete by: As directed    Call MD for:  temperature >100.4   Complete by: As directed    Diet - low sodium heart healthy   Complete by: As directed    Increase activity slowly   Complete by: As directed      Allergies as of 05/29/2019   No Known Allergies     Medication List    STOP taking these medications   atenolol-chlorthalidone 50-25 MG tablet Commonly known as: TENORETIC   oseltamivir 30 MG capsule Commonly known as: TAMIFLU     TAKE these medications   acetaminophen 500 MG tablet Commonly known as: TYLENOL Take  1,000 mg by mouth every 6 (six) hours as needed for moderate pain.   aspirin 81 MG EC tablet Take 81 mg by mouth daily.   atenolol 25 MG tablet Commonly known as: TENORMIN Take 1 tablet (25 mg total) by mouth daily.   cefdinir 300 MG capsule Commonly known as: OMNICEF Take 1 capsule (300 mg total) by mouth 2 (two) times daily for 5 days.   cholecalciferol 1000 units tablet Commonly known as: VITAMIN D Take 1,000 Units by mouth daily.   dexamethasone 6 MG tablet Commonly known as: DECADRON Take 1 tablet (6 mg total) by mouth daily for 5 days. Start taking on: May 30, 2019 What changed:   medication strength  how much to take  when to take this   hydrochlorothiazide 12.5 MG capsule Commonly known as: MICROZIDE Take 12.5 mg by mouth daily.   levothyroxine 25 MCG tablet Commonly known as: SYNTHROID Take 25 mcg by mouth daily before breakfast.   losartan 50 MG tablet Commonly known as: COZAAR Take 50 mg by mouth Daily.   metFORMIN 500 MG tablet Commonly known as: GLUCOPHAGE Take 500 mg by mouth 2 (two) times daily.   tamsulosin 0.4 MG Caps capsule Commonly known as: FLOMAX Take 0.4 mg by mouth daily.            Durable Medical Equipment  (From admission, onward)  Start     Ordered   05/29/19 0723  For home use only DME oxygen  Once    Question Answer Comment  Length of Need 6 Months   Mode or (Route) Nasal cannula   Liters per Minute 2   Frequency Continuous (stationary and portable oxygen unit needed)   Oxygen delivery system Gas      05/29/19 0722   05/28/19 0722  For home use only DME 3 n 1  Once     05/28/19 0721   05/28/19 0722  For home use only DME Walker rolling  Once    Question:  Patient needs a walker to treat with the following condition  Answer:  Gait disturbance   05/28/19 8115         Follow-up Information    Osei-Bonsu, Iona Beard, MD. Schedule an appointment as soon as possible for a visit in 1 week(s).   Specialty:  Internal Medicine Contact information: [address] [place] [address] [phone]          No Known Allergies  Consultations:  none   Procedures/Studies: US Abdomen Complete  Result Date: 05/26/2019 CLINICAL DATA:  Abnormal liver function. Acute renal failure. COVID-19 positive. EXAM: ABDOMEN ULTRASOUND COMPLETE COMPARISON:  None. FINDINGS: Gallbladder: Previous cholecystectomy. Common bile duct: Diameter: 4.9 mm. Liver: Mild coarse increased echogenicity compatible with steatosis with 2.4 cm geographic area of decreased echogenicity towards the porta hepatis likely focal fatty sparing. Portal vein is patent on color Doppler imaging with normal direction of blood flow towards the liver. IVC: No abnormality visualized. Pancreas: Not visualized. Spleen: Size and appearance within normal limits. Right Kidney: Length: 10.8 cm. Echogenicity within normal limits. No mass or hydronephrosis visualized. Left Kidney: Length: 10.5 cm. Echogenicity within normal limits. No mass or hydronephrosis visualized. Abdominal aorta: No aneurysm visualized. Other findings: None. IMPRESSION: 1.  No acute findings. 2.  Previous cholecystectomy. 3.  Mild hepatic steatosis with focal fatty sparing centrally. Electronically Signed   By: Marin Olp M.D.   On: 05/26/2019 08:36   DG Chest Port 1 View  Result Date: 05/25/2019 CLINICAL DATA:  Cough and fever 3 days which shortness-of-breath and sore throat. EXAM: PORTABLE CHEST 1 VIEW COMPARISON:  05/27/2016 FINDINGS: Patient slightly rotated to the left. Subtle interstitial prominence over the lung bases likely chronic. Possible minimal opacification in the left retrocardiac region/medial left base. Cardiomediastinal silhouette and remainder the exam is unchanged. IMPRESSION: Possible mild opacification in the medial left base/retrocardiac region which may be due to atelectasis or infection. Subtle bibasilar interstitial prominence likely chronic.  Electronically Signed   By: Marin Olp M.D.   On: 05/25/2019 09:40     Subjective: Patient seen and examined at bedside, resting comfortably.  Receiving last dose of remdesivir today, ready for discharge home.  No other specific complaints at this time.  Denies headache, no fever/chills/night sweats, no nausea/vomiting/diarrhea, no chest pain, palpitations, no abdominal pain, no fatigue, no paresthesias.  No acute events overnight per nursing staff.   Discharge Exam: Vitals:   05/29/19 0220 05/29/19 0501  BP: (!) 108/52 (!) 116/50  Pulse: (!) 49 (!) 51  Resp:  18  Temp:  98 F (36.7 C)  SpO2: 90% 90%   Vitals:   05/28/19 2021 05/28/19 2253 05/29/19 0220 05/29/19 0501  BP:  (!) 133/57 (!) 108/52 (!) 116/50  Pulse:  (!) 46 (!) 49 (!) 51  Resp:    18  Temp:    98 F (36.7 C)  TempSrc:    Oral  SpO2: 97%  90% 90%  Weight:      Height:        General: Pt is alert, awake, not in acute distress Cardiovascular: RRR, S1/S2 +, no rubs, no gallops Respiratory: CTA bilaterally, no wheezing, no rhonchi Abdominal: Soft, NT, ND, bowel sounds + Extremities: no edema, no cyanosis    The results of significant diagnostics from this hospitalization (including imaging, microbiology, ancillary and laboratory) are listed below for reference.     Microbiology: Recent Results (from the past 240 hour(s))  Urine culture     Status: Abnormal   Collection Time: 05/25/19  8:40 AM   Specimen: Urine, Clean Catch  Result Value Ref Range Status   Specimen Description   Final    URINE, CLEAN CATCH Performed at [facility], [address] [place], [place] [address]    Special Requests   Final    NONE Performed at [facility], [address] [place], [place] [address]    Culture (A)  Final    <10,000 COLONIES/mL INSIGNIFICANT GROWTH Performed at [facility], [place] [address], [place], [place] [address]    Report Status 05/26/2019 FINAL  Final  Blood Culture  (routine x 2)     Status: None (Preliminary result)   Collection Time: 05/25/19  9:01 AM   Specimen: Right Antecubital; Blood  Result Value Ref Range Status   Specimen Description   Final    RIGHT ANTECUBITAL Performed at [facility], [address] [place], [place] [address]    Special Requests   Final    BOTTLES DRAWN AEROBIC AND ANAEROBIC Blood Culture adequate volume Performed at [facility], [address] [place], [place] [address]    Culture   Final    NO GROWTH 3 DAYS Performed at [facility], [place] [address], [place], [place] [address]    Report Status PENDING  Incomplete  Blood Culture (routine x 2)     Status: None (Preliminary result)   Collection Time: 05/25/19  9:01 AM   Specimen: Left Antecubital; Blood  Result Value Ref Range Status   Specimen Description   Final    LEFT ANTECUBITAL Performed at [facility], [address] [place], [place] [address]    Special Requests   Final    BOTTLES DRAWN AEROBIC AND ANAEROBIC Blood Culture results may not be optimal due to an inadequate volume of blood received in culture bottles Performed at [facility], [address] [place], [place] [address]    Culture   Final    NO GROWTH 3 DAYS Performed at [facility], [place] [address] [place], [place] [address]    Report Status PENDING  Incomplete  SARS Coronavirus 2 Ag (30 min TAT) - Urine, Clean Catch     Status: Abnormal   Collection Time: 05/25/19  9:02 AM   Specimen: Urine, Clean Catch; Nasal Swab  Result Value Ref Range Status   SARS Coronavirus 2 Ag POSITIVE (A) NEGATIVE Final    Comment: RESULT CALLED TO, READ BACK BY AND VERIFIED WITH: CALLED TO C.REED RN AT [place] ON [phone] (NOTE) SARS-CoV-2 antigen PRESENT. Positive results indicate the presence of viral antigens, but clinical correlation with patient history and other diagnostic information is necessary to determine patient infection status.   Positive results do not rule out bacterial infection or  co-infection  with other viruses. False positive results are rare but can occur, and confirmatory RT-PCR testing may be appropriate in some circumstances. The expected result is Negative. Fact Sheet for Patients: PodPark.tn Fact Sheet for Providers: GiftContent.is  This test is not yet approved or cleared by the [place] FDA and  has been authorized for detection and/or diagnosis of SARS-CoV-2 by FDA under an Emergency Use Authorization (EUA).  This EUA will remain in effect (meaning this test can be used) for the duration of  the COVID- 19 declaration under Section 564(b)(1) of the Act, 21 U.S.C. section 360bbb-3(b)(1), unless the authorization is terminated or revoked sooner. Performed at [facility], [address] [place], [place] [address]      Labs: BNP (last 3 results) No results for input(s): BNP in the last 8760 hours. Basic Metabolic Panel: Recent Labs  Lab 05/25/19 0901 05/25/19 2124 05/26/19 0348 05/27/19 0345 05/28/19 0318 05/29/19 0300  NA 138  --  140 138 137 138  K 3.4*  --  3.7 3.6 3.7 3.9  CL 100  --  102 108 105 107  CO2 25  --  20* 20* 21* 21*  GLUCOSE 212*  --  260* 160* 212* 188*  BUN 54*  --  45* 39* 29* 27*  CREATININE 2.06* 1.62* 1.29* 1.25* 1.13 1.15  CALCIUM 9.0  --  7.7* 7.6* 7.8* 7.9*  MG  --   --   --  2.0  --   --    Liver Function Tests: Recent Labs  Lab 05/25/19 0901 05/26/19 0348 05/27/19 0345 05/28/19 0318 05/29/19 0300  AST 76* 54* 44* 37 37  ALT 44 36 29 27 23   ALKPHOS 53 40 38 40 46  BILITOT 1.4* 0.7 0.5 0.5 0.8  PROT 7.9 6.7 5.4* 5.5* 6.0*  ALBUMIN 3.9 3.1* 2.6* 2.6* 2.8*   No results for input(s): LIPASE, AMYLASE in the last 168 hours. No results for input(s): AMMONIA in the last 168 hours. CBC: Recent Labs  Lab 05/25/19 0901 05/26/19 0348 05/27/19 0345 05/28/19 0318 05/29/19 0300   WBC 7.2 7.6 7.0 5.1 6.1  NEUTROABS 6.2 6.2 5.5 4.0 4.8  HGB 14.3 14.0 13.0 12.1* 13.2  HCT 43.6 42.8 40.4 36.8* 38.2*  MCV 88.3 89.0 89.0 88.2 84.0  PLT 92* 96* 98* 84* 71*   Cardiac Enzymes: No results for input(s): CKTOTAL, CKMB, CKMBINDEX, TROPONINI in the last 168 hours. BNP: Invalid input(s): POCBNP CBG: Recent Labs  Lab 05/28/19 0749 05/28/19 1154 05/28/19 1653 05/28/19 2129 05/29/19 0803  GLUCAP 140* 195* 276* 239* 139*   D-Dimer Recent Labs    05/28/19 0318 05/29/19 0300  DDIMER 0.54* 0.80*   Hgb A1c No results for input(s): HGBA1C in the last 72 hours. Lipid Profile No results for input(s): CHOL, HDL, LDLCALC, TRIG, CHOLHDL, LDLDIRECT in the last 72 hours. Thyroid function studies No results for input(s): TSH, T4TOTAL, T3FREE, THYROIDAB in the last 72 hours.  Invalid input(s): FREET3 Anemia work up Recent Labs    05/28/19 0318 05/29/19 0300  FERRITIN 978* 843*   Urinalysis    Component Value Date/Time   COLORURINE YELLOW 05/25/2019 0901   APPEARANCEUR CLEAR 05/25/2019 0901   LABSPEC 1.020 05/25/2019 0901   PHURINE 5.5 05/25/2019 0901   GLUCOSEU NEGATIVE 05/25/2019 0901   HGBUR MODERATE (A) 05/25/2019 0901   BILIRUBINUR NEGATIVE 05/25/2019 0901   KETONESUR NEGATIVE 05/25/2019 0901   PROTEINUR 30 (A) 05/25/2019 0901   NITRITE NEGATIVE 05/25/2019 0901  LEUKOCYTESUR MODERATE (A) 05/25/2019 0901   Sepsis Labs Invalid input(s): PROCALCITONIN,  WBC,  LACTICIDVEN Microbiology Recent Results (from the past 240 hour(s))  Urine culture     Status: Abnormal   Collection Time: 05/25/19  8:40 AM   Specimen: Urine, Clean Catch  Result Value Ref Range Status   Specimen Description   Final    URINE, CLEAN CATCH Performed at [facility], [address] [place], [place] [address]    Special Requests   Final    NONE Performed at [facility], [address] [place], [place] [address]    Culture (A)  Final    <10,000  COLONIES/mL INSIGNIFICANT GROWTH Performed at [facility], [place] [address] [place], [place] [address]    Report Status 05/26/2019 FINAL  Final  Blood Culture (routine x 2)     Status: None (Preliminary result)   Collection Time: 05/25/19  9:01 AM   Specimen: Right Antecubital; Blood  Result Value Ref Range Status   Specimen Description   Final    RIGHT ANTECUBITAL Performed at [facility], [address] [place], [place] [address]    Special Requests   Final    BOTTLES DRAWN AEROBIC AND ANAEROBIC Blood Culture adequate volume Performed at [facility], [address] [place], [place] [address]    Culture   Final    NO GROWTH 3 DAYS Performed at [facility], [place] [address], [place], [place] [address]    Report Status PENDING  Incomplete  Blood Culture (routine x 2)     Status: None (Preliminary result)   Collection Time: 05/25/19  9:01 AM   Specimen: Left Antecubital; Blood  Result Value Ref Range Status   Specimen Description   Final    LEFT ANTECUBITAL Performed at [facility], [address] [place], [place] [address]    Special Requests   Final    BOTTLES DRAWN AEROBIC AND ANAEROBIC Blood Culture results may not be optimal due to an inadequate volume of blood received in culture bottles Performed at [facility], [address] [place], [place] [address]    Culture   Final    NO GROWTH 3 DAYS Performed at [facility], [place] [address] [place], [place] [address]    Report Status PENDING  Incomplete  SARS Coronavirus 2 Ag (30 min TAT) - Urine, Clean Catch     Status: Abnormal   Collection Time: 05/25/19  9:02 AM   Specimen: Urine, Clean Catch; Nasal Swab  Result Value Ref Range Status   SARS Coronavirus 2 Ag POSITIVE (A) NEGATIVE Final    Comment: RESULT CALLED TO, READ BACK BY AND VERIFIED WITH: CALLED TO C.REED RN AT [place] ON [phone] (NOTE) SARS-CoV-2 antigen PRESENT. Positive results  indicate the presence of viral antigens, but clinical correlation with patient history and other diagnostic information is necessary to determine patient infection status.  Positive results do not rule out bacterial infection or co-infection  with other viruses. False positive results are rare but can occur, and confirmatory RT-PCR testing may be appropriate in some circumstances. The expected result is Negative. Fact Sheet for Patients: PodPark.tn Fact Sheet for Providers: GiftContent.is  This test is not yet approved or cleared by the [place] FDA and  has been authorized for detection and/or diagnosis of SARS-CoV-2 by FDA under an Emergency Use Authorization (EUA).  This EUA will remain in effect (meaning this test can be used) for the duration of  the COVID- 19 declaration under Section 564(b)(1) of the Act, 21 U.S.C. section 360bbb-3(b)(1), unless the authorization is terminated or revoked sooner. Performed at [facility], [address] [place], [place] [address]      Time coordinating discharge: Over 30 minutes  SIGNED:   Babbie Dondlinger J [place] DO  [facility] 05/29/2019, 10:50 AM

## 2019-05-29 NOTE — TOC Progression Note (Signed)
Transition of Care [facility]) - Progression Note    Patient Details  Name: Skylur Bedner MRN: [id number] Date of Birth: Oct 08, 1945  [facility] of [facility] CM/SW Contact  Purcell Mouton, RN Phone Number: 05/29/2019, 11:53 AM  Clinical Narrative:    [employer] will deliver Home O2 tank to floor and to the home. Pt is aware. Spoke with pt's wife who understood that [employer] rep will call her to bring Home O2.         Expected Discharge Plan and Services           Expected Discharge Date: 05/29/19                                     Social Determinants of Health (SDOH) Interventions    Readmission Risk Interventions No flowsheet data found.

## 2019-05-29 NOTE — Progress Notes (Signed)
Occupational Therapy Treatment Patient Details Name: Blake Herman MRN: [id number] DOB: Dec 02, 1945 Today's Date: 05/29/2019    History of present illness 73 yo male admitted with COVID 19 Pna, sepsis. Hx of DM, vestibular schwanoma   OT comments  Performed bathing, changed gown and got up to chair. Reinforced energy conservation. Pt plans home today  Follow Up Recommendations  Home health OT;Supervision/Assistance - 24 hour    Equipment Recommendations  3 in 1 bedside commode    Recommendations for Other Services      Precautions / Restrictions Precautions Precautions: Fall Precaution Comments: monitor O2 sats Restrictions Weight Bearing Restrictions: No       Mobility Bed Mobility Overal bed mobility: Modified Independent                Transfers       Sit to Stand: Supervision         General transfer comment: cues for hand placement    Balance                                           ADL either performed or assessed with clinical judgement   ADL           Upper Body Bathing: Set up;Sitting   Lower Body Bathing: Supervison/ safety;Sit to/from stand   Upper Body Dressing : Minimal assistance;Sitting(gown due to lines)   Lower Body Dressing: Supervision/safety;Sit to/from stand   Toilet Transfer: Supervision/safety;Stand-pivot;RW(chair)             General ADL Comments: pt is planning d/c home today; DME delivered.  Pt's bed was saturated with urine and he had used urinal.  Set up for bathing and donned new gown and slip on shoes.  Waiting closer to d/c time for clothing due to urine spill or accident. Reinforced energy conservation/breaks     Vision       Perception     Praxis      Cognition Arousal/Alertness: Awake/alert Behavior During Therapy: WFL for tasks assessed/performed Overall Cognitive Status: Within Functional Limits for tasks assessed                                           Exercises     Shoulder Instructions       General Comments      Pertinent Vitals/ Pain       Pain Assessment: No/denies pain  Home Living                                          Prior Functioning/Environment              Frequency  Min 2X/week        Progress Toward Goals  OT Goals(current goals can now be found in the care plan section)  Progress towards OT goals: Progressing toward goals     Plan      Co-evaluation                 AM-PAC OT "6 Clicks" Daily Activity     Outcome Measure   Help from another person eating meals?: None Help from another person taking care of personal grooming?: A  Little Help from another person toileting, which includes using toliet, bedpan, or urinal?: A Little Help from another person bathing (including washing, rinsing, drying)?: A Little Help from another person to put on and taking off regular upper body clothing?: A Little Help from another person to put on and taking off regular lower body clothing?: A Little 6 Click Score: 19    End of Session    OT Visit Diagnosis: Unsteadiness on feet (R26.81);Repeated falls (R29.6);Muscle weakness (generalized) (M62.81)   Activity Tolerance Patient tolerated treatment well   Patient Left in chair;with call bell/phone within reach   Nurse Communication          Time: [id number] OT Time Calculation (min): 32 min  Charges: OT General Charges $OT Visit: 1 Visit OT Treatments $Self Care/Home Management : 23-37 mins  Jalayne Ganesh S, OTR/L Acute Rehabilitation Services 05/29/2019   [place] 05/29/2019, 1:33 PM

## 2019-05-29 NOTE — Progress Notes (Signed)
SATURATION QUALIFICATIONS: (This note is used to comply with regulatory documentation for home oxygen)  Patient Saturations on Room Air at Rest = 83%  Patient Saturations on Room Air while Ambulating = 80%  Patient Saturations on 3 Liters of oxygen while Ambulating = 90%  Please briefly explain why patient needs home oxygen:

## 2019-05-30 LAB — CULTURE, BLOOD (ROUTINE X 2)
Culture: NO GROWTH
Culture: NO GROWTH
Special Requests: ADEQUATE

## 2019-06-07 DIAGNOSIS — J1289 Other viral pneumonia: Secondary | ICD-10-CM | POA: Diagnosis not present

## 2019-06-07 DIAGNOSIS — U071 COVID-19: Secondary | ICD-10-CM | POA: Diagnosis not present

## 2019-06-07 DIAGNOSIS — E119 Type 2 diabetes mellitus without complications: Secondary | ICD-10-CM | POA: Diagnosis not present

## 2019-06-07 DIAGNOSIS — I10 Essential (primary) hypertension: Secondary | ICD-10-CM | POA: Diagnosis not present

## 2019-06-07 DIAGNOSIS — G8929 Other chronic pain: Secondary | ICD-10-CM | POA: Diagnosis not present

## 2019-06-07 DIAGNOSIS — K760 Fatty (change of) liver, not elsewhere classified: Secondary | ICD-10-CM | POA: Diagnosis not present

## 2019-06-07 DIAGNOSIS — M1990 Unspecified osteoarthritis, unspecified site: Secondary | ICD-10-CM | POA: Diagnosis not present

## 2019-06-07 DIAGNOSIS — M545 Low back pain: Secondary | ICD-10-CM | POA: Diagnosis not present

## 2019-06-07 DIAGNOSIS — M25572 Pain in left ankle and joints of left foot: Secondary | ICD-10-CM | POA: Diagnosis not present

## 2019-06-07 DIAGNOSIS — M109 Gout, unspecified: Secondary | ICD-10-CM | POA: Diagnosis not present

## 2019-06-11 DIAGNOSIS — G8929 Other chronic pain: Secondary | ICD-10-CM | POA: Diagnosis not present

## 2019-06-11 DIAGNOSIS — R69 Illness, unspecified: Secondary | ICD-10-CM | POA: Diagnosis not present

## 2019-06-11 DIAGNOSIS — M1990 Unspecified osteoarthritis, unspecified site: Secondary | ICD-10-CM | POA: Diagnosis not present

## 2019-06-11 DIAGNOSIS — I10 Essential (primary) hypertension: Secondary | ICD-10-CM | POA: Diagnosis not present

## 2019-06-11 DIAGNOSIS — E1165 Type 2 diabetes mellitus with hyperglycemia: Secondary | ICD-10-CM | POA: Diagnosis not present

## 2019-06-11 DIAGNOSIS — D696 Thrombocytopenia, unspecified: Secondary | ICD-10-CM | POA: Diagnosis not present

## 2019-06-11 DIAGNOSIS — M109 Gout, unspecified: Secondary | ICD-10-CM | POA: Diagnosis not present

## 2019-06-11 DIAGNOSIS — E119 Type 2 diabetes mellitus without complications: Secondary | ICD-10-CM | POA: Diagnosis not present

## 2019-06-11 DIAGNOSIS — U071 COVID-19: Secondary | ICD-10-CM | POA: Diagnosis not present

## 2019-06-11 DIAGNOSIS — K760 Fatty (change of) liver, not elsewhere classified: Secondary | ICD-10-CM | POA: Diagnosis not present

## 2019-06-11 DIAGNOSIS — E039 Hypothyroidism, unspecified: Secondary | ICD-10-CM | POA: Diagnosis not present

## 2019-06-11 DIAGNOSIS — M545 Low back pain: Secondary | ICD-10-CM | POA: Diagnosis not present

## 2019-06-11 DIAGNOSIS — J1289 Other viral pneumonia: Secondary | ICD-10-CM | POA: Diagnosis not present

## 2019-06-11 DIAGNOSIS — M25572 Pain in left ankle and joints of left foot: Secondary | ICD-10-CM | POA: Diagnosis not present

## 2019-06-11 DIAGNOSIS — I119 Hypertensive heart disease without heart failure: Secondary | ICD-10-CM | POA: Diagnosis not present

## 2019-07-03 DIAGNOSIS — J1282 Pneumonia due to coronavirus disease 2019: Secondary | ICD-10-CM | POA: Diagnosis not present

## 2019-07-03 DIAGNOSIS — I119 Hypertensive heart disease without heart failure: Secondary | ICD-10-CM | POA: Diagnosis not present

## 2019-07-03 DIAGNOSIS — M109 Gout, unspecified: Secondary | ICD-10-CM | POA: Diagnosis not present

## 2019-07-03 DIAGNOSIS — N179 Acute kidney failure, unspecified: Secondary | ICD-10-CM | POA: Diagnosis not present

## 2019-07-03 DIAGNOSIS — D696 Thrombocytopenia, unspecified: Secondary | ICD-10-CM | POA: Diagnosis not present

## 2019-07-03 DIAGNOSIS — I10 Essential (primary) hypertension: Secondary | ICD-10-CM | POA: Diagnosis not present

## 2019-07-03 DIAGNOSIS — E1165 Type 2 diabetes mellitus with hyperglycemia: Secondary | ICD-10-CM | POA: Diagnosis not present

## 2019-07-03 DIAGNOSIS — R69 Illness, unspecified: Secondary | ICD-10-CM | POA: Diagnosis not present

## 2019-07-03 DIAGNOSIS — U071 COVID-19: Secondary | ICD-10-CM | POA: Diagnosis not present

## 2019-07-03 DIAGNOSIS — E039 Hypothyroidism, unspecified: Secondary | ICD-10-CM | POA: Diagnosis not present

## 2019-07-17 DIAGNOSIS — J1282 Pneumonia due to coronavirus disease 2019: Secondary | ICD-10-CM | POA: Diagnosis not present

## 2019-07-17 DIAGNOSIS — E039 Hypothyroidism, unspecified: Secondary | ICD-10-CM | POA: Diagnosis not present

## 2019-07-17 DIAGNOSIS — D696 Thrombocytopenia, unspecified: Secondary | ICD-10-CM | POA: Diagnosis not present

## 2019-07-17 DIAGNOSIS — U071 COVID-19: Secondary | ICD-10-CM | POA: Diagnosis not present

## 2019-07-17 DIAGNOSIS — E1165 Type 2 diabetes mellitus with hyperglycemia: Secondary | ICD-10-CM | POA: Diagnosis not present

## 2019-07-17 DIAGNOSIS — I119 Hypertensive heart disease without heart failure: Secondary | ICD-10-CM | POA: Diagnosis not present

## 2019-07-17 DIAGNOSIS — I10 Essential (primary) hypertension: Secondary | ICD-10-CM | POA: Diagnosis not present

## 2019-07-17 DIAGNOSIS — R69 Illness, unspecified: Secondary | ICD-10-CM | POA: Diagnosis not present

## 2019-07-17 DIAGNOSIS — M109 Gout, unspecified: Secondary | ICD-10-CM | POA: Diagnosis not present

## 2019-08-06 DIAGNOSIS — N289 Disorder of kidney and ureter, unspecified: Secondary | ICD-10-CM | POA: Diagnosis not present

## 2019-08-12 DIAGNOSIS — E119 Type 2 diabetes mellitus without complications: Secondary | ICD-10-CM | POA: Diagnosis not present

## 2019-08-12 DIAGNOSIS — I10 Essential (primary) hypertension: Secondary | ICD-10-CM | POA: Diagnosis not present

## 2019-08-12 DIAGNOSIS — N390 Urinary tract infection, site not specified: Secondary | ICD-10-CM | POA: Diagnosis not present

## 2019-09-03 DIAGNOSIS — M109 Gout, unspecified: Secondary | ICD-10-CM | POA: Diagnosis not present

## 2019-09-03 DIAGNOSIS — I119 Hypertensive heart disease without heart failure: Secondary | ICD-10-CM | POA: Diagnosis not present

## 2019-09-03 DIAGNOSIS — E039 Hypothyroidism, unspecified: Secondary | ICD-10-CM | POA: Diagnosis not present

## 2019-09-03 DIAGNOSIS — I10 Essential (primary) hypertension: Secondary | ICD-10-CM | POA: Diagnosis not present

## 2019-09-03 DIAGNOSIS — R69 Illness, unspecified: Secondary | ICD-10-CM | POA: Diagnosis not present

## 2019-09-03 DIAGNOSIS — E119 Type 2 diabetes mellitus without complications: Secondary | ICD-10-CM | POA: Diagnosis not present

## 2019-09-03 DIAGNOSIS — D696 Thrombocytopenia, unspecified: Secondary | ICD-10-CM | POA: Diagnosis not present

## 2019-09-03 DIAGNOSIS — M5442 Lumbago with sciatica, left side: Secondary | ICD-10-CM | POA: Diagnosis not present

## 2019-09-03 DIAGNOSIS — E1165 Type 2 diabetes mellitus with hyperglycemia: Secondary | ICD-10-CM | POA: Diagnosis not present

## 2019-11-05 DIAGNOSIS — N138 Other obstructive and reflux uropathy: Secondary | ICD-10-CM | POA: Diagnosis not present

## 2019-11-05 DIAGNOSIS — R358 Other polyuria: Secondary | ICD-10-CM | POA: Diagnosis not present

## 2019-11-05 DIAGNOSIS — N401 Enlarged prostate with lower urinary tract symptoms: Secondary | ICD-10-CM | POA: Diagnosis not present

## 2019-11-05 DIAGNOSIS — R339 Retention of urine, unspecified: Secondary | ICD-10-CM | POA: Diagnosis not present

## 2019-12-16 DIAGNOSIS — E1165 Type 2 diabetes mellitus with hyperglycemia: Secondary | ICD-10-CM | POA: Diagnosis not present

## 2019-12-16 DIAGNOSIS — Z0001 Encounter for general adult medical examination with abnormal findings: Secondary | ICD-10-CM | POA: Diagnosis not present

## 2019-12-16 DIAGNOSIS — I119 Hypertensive heart disease without heart failure: Secondary | ICD-10-CM | POA: Diagnosis not present

## 2019-12-16 DIAGNOSIS — R69 Illness, unspecified: Secondary | ICD-10-CM | POA: Diagnosis not present

## 2019-12-16 DIAGNOSIS — E119 Type 2 diabetes mellitus without complications: Secondary | ICD-10-CM | POA: Diagnosis not present

## 2019-12-16 DIAGNOSIS — Z125 Encounter for screening for malignant neoplasm of prostate: Secondary | ICD-10-CM | POA: Diagnosis not present

## 2019-12-16 DIAGNOSIS — M109 Gout, unspecified: Secondary | ICD-10-CM | POA: Diagnosis not present

## 2019-12-16 DIAGNOSIS — I10 Essential (primary) hypertension: Secondary | ICD-10-CM | POA: Diagnosis not present

## 2019-12-16 DIAGNOSIS — D696 Thrombocytopenia, unspecified: Secondary | ICD-10-CM | POA: Diagnosis not present

## 2019-12-16 DIAGNOSIS — E039 Hypothyroidism, unspecified: Secondary | ICD-10-CM | POA: Diagnosis not present

## 2020-02-01 DIAGNOSIS — R69 Illness, unspecified: Secondary | ICD-10-CM | POA: Diagnosis not present

## 2020-04-13 DIAGNOSIS — M9902 Segmental and somatic dysfunction of thoracic region: Secondary | ICD-10-CM | POA: Diagnosis not present

## 2020-04-13 DIAGNOSIS — M9901 Segmental and somatic dysfunction of cervical region: Secondary | ICD-10-CM | POA: Diagnosis not present

## 2020-04-13 DIAGNOSIS — M47812 Spondylosis without myelopathy or radiculopathy, cervical region: Secondary | ICD-10-CM | POA: Diagnosis not present

## 2020-04-13 DIAGNOSIS — M47814 Spondylosis without myelopathy or radiculopathy, thoracic region: Secondary | ICD-10-CM | POA: Diagnosis not present

## 2020-04-14 DIAGNOSIS — M9901 Segmental and somatic dysfunction of cervical region: Secondary | ICD-10-CM | POA: Diagnosis not present

## 2020-04-14 DIAGNOSIS — M47812 Spondylosis without myelopathy or radiculopathy, cervical region: Secondary | ICD-10-CM | POA: Diagnosis not present

## 2020-04-14 DIAGNOSIS — M9902 Segmental and somatic dysfunction of thoracic region: Secondary | ICD-10-CM | POA: Diagnosis not present

## 2020-04-14 DIAGNOSIS — M47814 Spondylosis without myelopathy or radiculopathy, thoracic region: Secondary | ICD-10-CM | POA: Diagnosis not present

## 2020-04-16 DIAGNOSIS — M9902 Segmental and somatic dysfunction of thoracic region: Secondary | ICD-10-CM | POA: Diagnosis not present

## 2020-04-16 DIAGNOSIS — M47812 Spondylosis without myelopathy or radiculopathy, cervical region: Secondary | ICD-10-CM | POA: Diagnosis not present

## 2020-04-16 DIAGNOSIS — M47814 Spondylosis without myelopathy or radiculopathy, thoracic region: Secondary | ICD-10-CM | POA: Diagnosis not present

## 2020-04-16 DIAGNOSIS — M9901 Segmental and somatic dysfunction of cervical region: Secondary | ICD-10-CM | POA: Diagnosis not present

## 2020-04-20 DIAGNOSIS — M47812 Spondylosis without myelopathy or radiculopathy, cervical region: Secondary | ICD-10-CM | POA: Diagnosis not present

## 2020-04-20 DIAGNOSIS — M9902 Segmental and somatic dysfunction of thoracic region: Secondary | ICD-10-CM | POA: Diagnosis not present

## 2020-04-20 DIAGNOSIS — M9901 Segmental and somatic dysfunction of cervical region: Secondary | ICD-10-CM | POA: Diagnosis not present

## 2020-04-20 DIAGNOSIS — M47814 Spondylosis without myelopathy or radiculopathy, thoracic region: Secondary | ICD-10-CM | POA: Diagnosis not present

## 2020-05-16 DIAGNOSIS — E119 Type 2 diabetes mellitus without complications: Secondary | ICD-10-CM | POA: Diagnosis not present

## 2020-06-09 DIAGNOSIS — H2511 Age-related nuclear cataract, right eye: Secondary | ICD-10-CM | POA: Diagnosis not present

## 2020-06-09 DIAGNOSIS — H179 Unspecified corneal scar and opacity: Secondary | ICD-10-CM | POA: Diagnosis not present

## 2020-06-09 DIAGNOSIS — H2512 Age-related nuclear cataract, left eye: Secondary | ICD-10-CM | POA: Diagnosis not present

## 2020-06-18 DIAGNOSIS — H2511 Age-related nuclear cataract, right eye: Secondary | ICD-10-CM | POA: Diagnosis not present

## 2020-06-26 DIAGNOSIS — E039 Hypothyroidism, unspecified: Secondary | ICD-10-CM | POA: Diagnosis not present

## 2020-06-26 DIAGNOSIS — M5417 Radiculopathy, lumbosacral region: Secondary | ICD-10-CM | POA: Diagnosis not present

## 2020-06-26 DIAGNOSIS — E119 Type 2 diabetes mellitus without complications: Secondary | ICD-10-CM | POA: Diagnosis not present

## 2020-06-26 DIAGNOSIS — E1165 Type 2 diabetes mellitus with hyperglycemia: Secondary | ICD-10-CM | POA: Diagnosis not present

## 2020-06-26 DIAGNOSIS — I10 Essential (primary) hypertension: Secondary | ICD-10-CM | POA: Diagnosis not present

## 2020-06-26 DIAGNOSIS — R69 Illness, unspecified: Secondary | ICD-10-CM | POA: Diagnosis not present

## 2020-06-30 DIAGNOSIS — Z7982 Long term (current) use of aspirin: Secondary | ICD-10-CM | POA: Diagnosis not present

## 2020-06-30 DIAGNOSIS — N400 Enlarged prostate without lower urinary tract symptoms: Secondary | ICD-10-CM | POA: Diagnosis not present

## 2020-06-30 DIAGNOSIS — Z7984 Long term (current) use of oral hypoglycemic drugs: Secondary | ICD-10-CM | POA: Diagnosis not present

## 2020-06-30 DIAGNOSIS — Z833 Family history of diabetes mellitus: Secondary | ICD-10-CM | POA: Diagnosis not present

## 2020-06-30 DIAGNOSIS — Z8249 Family history of ischemic heart disease and other diseases of the circulatory system: Secondary | ICD-10-CM | POA: Diagnosis not present

## 2020-06-30 DIAGNOSIS — I10 Essential (primary) hypertension: Secondary | ICD-10-CM | POA: Diagnosis not present

## 2020-06-30 DIAGNOSIS — E039 Hypothyroidism, unspecified: Secondary | ICD-10-CM | POA: Diagnosis not present

## 2020-06-30 DIAGNOSIS — E119 Type 2 diabetes mellitus without complications: Secondary | ICD-10-CM | POA: Diagnosis not present

## 2020-06-30 DIAGNOSIS — R32 Unspecified urinary incontinence: Secondary | ICD-10-CM | POA: Diagnosis not present

## 2020-06-30 DIAGNOSIS — Z87440 Personal history of urinary (tract) infections: Secondary | ICD-10-CM | POA: Diagnosis not present

## 2020-07-14 DIAGNOSIS — E1165 Type 2 diabetes mellitus with hyperglycemia: Secondary | ICD-10-CM | POA: Diagnosis not present

## 2020-07-14 DIAGNOSIS — I10 Essential (primary) hypertension: Secondary | ICD-10-CM | POA: Diagnosis not present

## 2020-07-14 DIAGNOSIS — R69 Illness, unspecified: Secondary | ICD-10-CM | POA: Diagnosis not present

## 2020-07-14 DIAGNOSIS — E039 Hypothyroidism, unspecified: Secondary | ICD-10-CM | POA: Diagnosis not present

## 2020-07-14 DIAGNOSIS — N182 Chronic kidney disease, stage 2 (mild): Secondary | ICD-10-CM | POA: Diagnosis not present

## 2020-07-30 DIAGNOSIS — I10 Essential (primary) hypertension: Secondary | ICD-10-CM | POA: Diagnosis not present

## 2020-07-30 DIAGNOSIS — R69 Illness, unspecified: Secondary | ICD-10-CM | POA: Diagnosis not present

## 2020-07-30 DIAGNOSIS — E039 Hypothyroidism, unspecified: Secondary | ICD-10-CM | POA: Diagnosis not present

## 2020-07-30 DIAGNOSIS — N182 Chronic kidney disease, stage 2 (mild): Secondary | ICD-10-CM | POA: Diagnosis not present

## 2020-07-30 DIAGNOSIS — E1165 Type 2 diabetes mellitus with hyperglycemia: Secondary | ICD-10-CM | POA: Diagnosis not present

## 2020-11-13 DIAGNOSIS — R69 Illness, unspecified: Secondary | ICD-10-CM | POA: Diagnosis not present

## 2020-11-13 DIAGNOSIS — Z125 Encounter for screening for malignant neoplasm of prostate: Secondary | ICD-10-CM | POA: Diagnosis not present

## 2020-11-13 DIAGNOSIS — N182 Chronic kidney disease, stage 2 (mild): Secondary | ICD-10-CM | POA: Diagnosis not present

## 2020-11-13 DIAGNOSIS — E039 Hypothyroidism, unspecified: Secondary | ICD-10-CM | POA: Diagnosis not present

## 2020-11-13 DIAGNOSIS — I10 Essential (primary) hypertension: Secondary | ICD-10-CM | POA: Diagnosis not present

## 2020-11-13 DIAGNOSIS — Z0001 Encounter for general adult medical examination with abnormal findings: Secondary | ICD-10-CM | POA: Diagnosis not present

## 2020-11-13 DIAGNOSIS — E1165 Type 2 diabetes mellitus with hyperglycemia: Secondary | ICD-10-CM | POA: Diagnosis not present

## 2020-12-15 DIAGNOSIS — N182 Chronic kidney disease, stage 2 (mild): Secondary | ICD-10-CM | POA: Diagnosis not present

## 2020-12-15 DIAGNOSIS — I10 Essential (primary) hypertension: Secondary | ICD-10-CM | POA: Diagnosis not present

## 2020-12-15 DIAGNOSIS — E1165 Type 2 diabetes mellitus with hyperglycemia: Secondary | ICD-10-CM | POA: Diagnosis not present

## 2020-12-15 DIAGNOSIS — N3000 Acute cystitis without hematuria: Secondary | ICD-10-CM | POA: Diagnosis not present

## 2020-12-15 DIAGNOSIS — E039 Hypothyroidism, unspecified: Secondary | ICD-10-CM | POA: Diagnosis not present

## 2020-12-15 DIAGNOSIS — R69 Illness, unspecified: Secondary | ICD-10-CM | POA: Diagnosis not present

## 2021-03-04 DIAGNOSIS — E039 Hypothyroidism, unspecified: Secondary | ICD-10-CM | POA: Diagnosis not present

## 2021-03-04 DIAGNOSIS — F5221 Male erectile disorder: Secondary | ICD-10-CM | POA: Diagnosis not present

## 2021-03-04 DIAGNOSIS — E1165 Type 2 diabetes mellitus with hyperglycemia: Secondary | ICD-10-CM | POA: Diagnosis not present

## 2021-03-04 DIAGNOSIS — Z0001 Encounter for general adult medical examination with abnormal findings: Secondary | ICD-10-CM | POA: Diagnosis not present

## 2021-03-04 DIAGNOSIS — I10 Essential (primary) hypertension: Secondary | ICD-10-CM | POA: Diagnosis not present

## 2021-03-04 DIAGNOSIS — N182 Chronic kidney disease, stage 2 (mild): Secondary | ICD-10-CM | POA: Diagnosis not present

## 2021-03-04 DIAGNOSIS — R69 Illness, unspecified: Secondary | ICD-10-CM | POA: Diagnosis not present

## 2021-04-20 DIAGNOSIS — E1165 Type 2 diabetes mellitus with hyperglycemia: Secondary | ICD-10-CM | POA: Diagnosis not present

## 2021-04-20 DIAGNOSIS — E039 Hypothyroidism, unspecified: Secondary | ICD-10-CM | POA: Diagnosis not present

## 2021-04-20 DIAGNOSIS — I10 Essential (primary) hypertension: Secondary | ICD-10-CM | POA: Diagnosis not present

## 2021-04-20 DIAGNOSIS — Z23 Encounter for immunization: Secondary | ICD-10-CM | POA: Diagnosis not present

## 2021-04-20 DIAGNOSIS — R69 Illness, unspecified: Secondary | ICD-10-CM | POA: Diagnosis not present

## 2021-07-22 DIAGNOSIS — R52 Pain, unspecified: Secondary | ICD-10-CM | POA: Diagnosis not present

## 2021-07-22 DIAGNOSIS — M542 Cervicalgia: Secondary | ICD-10-CM | POA: Diagnosis not present

## 2021-07-22 DIAGNOSIS — M2578 Osteophyte, vertebrae: Secondary | ICD-10-CM | POA: Diagnosis not present

## 2021-07-22 DIAGNOSIS — M25511 Pain in right shoulder: Secondary | ICD-10-CM | POA: Diagnosis not present

## 2021-07-22 DIAGNOSIS — M25512 Pain in left shoulder: Secondary | ICD-10-CM | POA: Diagnosis not present

## 2021-09-27 DIAGNOSIS — E1165 Type 2 diabetes mellitus with hyperglycemia: Secondary | ICD-10-CM | POA: Diagnosis not present

## 2021-09-27 DIAGNOSIS — Z1329 Encounter for screening for other suspected endocrine disorder: Secondary | ICD-10-CM | POA: Diagnosis not present

## 2021-09-27 DIAGNOSIS — Z125 Encounter for screening for malignant neoplasm of prostate: Secondary | ICD-10-CM | POA: Diagnosis not present

## 2021-09-27 DIAGNOSIS — Z0001 Encounter for general adult medical examination with abnormal findings: Secondary | ICD-10-CM | POA: Diagnosis not present

## 2021-09-27 DIAGNOSIS — E039 Hypothyroidism, unspecified: Secondary | ICD-10-CM | POA: Diagnosis not present

## 2021-09-27 DIAGNOSIS — I10 Essential (primary) hypertension: Secondary | ICD-10-CM | POA: Diagnosis not present

## 2021-09-27 DIAGNOSIS — Z131 Encounter for screening for diabetes mellitus: Secondary | ICD-10-CM | POA: Diagnosis not present

## 2021-09-27 DIAGNOSIS — Z136 Encounter for screening for cardiovascular disorders: Secondary | ICD-10-CM | POA: Diagnosis not present

## 2021-10-12 DIAGNOSIS — E1165 Type 2 diabetes mellitus with hyperglycemia: Secondary | ICD-10-CM | POA: Diagnosis not present

## 2021-10-12 DIAGNOSIS — K219 Gastro-esophageal reflux disease without esophagitis: Secondary | ICD-10-CM | POA: Diagnosis not present

## 2021-10-12 DIAGNOSIS — I10 Essential (primary) hypertension: Secondary | ICD-10-CM | POA: Diagnosis not present

## 2021-10-12 DIAGNOSIS — N1831 Chronic kidney disease, stage 3a: Secondary | ICD-10-CM | POA: Diagnosis not present

## 2021-10-12 DIAGNOSIS — D696 Thrombocytopenia, unspecified: Secondary | ICD-10-CM | POA: Diagnosis not present

## 2021-12-02 DIAGNOSIS — N1831 Chronic kidney disease, stage 3a: Secondary | ICD-10-CM | POA: Diagnosis not present

## 2021-12-02 DIAGNOSIS — I10 Essential (primary) hypertension: Secondary | ICD-10-CM | POA: Diagnosis not present

## 2021-12-02 DIAGNOSIS — K219 Gastro-esophageal reflux disease without esophagitis: Secondary | ICD-10-CM | POA: Diagnosis not present

## 2021-12-02 DIAGNOSIS — E1165 Type 2 diabetes mellitus with hyperglycemia: Secondary | ICD-10-CM | POA: Diagnosis not present

## 2021-12-02 DIAGNOSIS — D696 Thrombocytopenia, unspecified: Secondary | ICD-10-CM | POA: Diagnosis not present

## 2022-03-01 DIAGNOSIS — N1831 Chronic kidney disease, stage 3a: Secondary | ICD-10-CM | POA: Diagnosis not present

## 2022-03-01 DIAGNOSIS — I10 Essential (primary) hypertension: Secondary | ICD-10-CM | POA: Diagnosis not present

## 2022-03-01 DIAGNOSIS — D696 Thrombocytopenia, unspecified: Secondary | ICD-10-CM | POA: Diagnosis not present

## 2022-03-01 DIAGNOSIS — E1165 Type 2 diabetes mellitus with hyperglycemia: Secondary | ICD-10-CM | POA: Diagnosis not present

## 2022-03-01 DIAGNOSIS — K219 Gastro-esophageal reflux disease without esophagitis: Secondary | ICD-10-CM | POA: Diagnosis not present

## 2022-03-07 DIAGNOSIS — Z0001 Encounter for general adult medical examination with abnormal findings: Secondary | ICD-10-CM | POA: Diagnosis not present

## 2022-03-07 DIAGNOSIS — K219 Gastro-esophageal reflux disease without esophagitis: Secondary | ICD-10-CM | POA: Diagnosis not present

## 2022-03-07 DIAGNOSIS — I10 Essential (primary) hypertension: Secondary | ICD-10-CM | POA: Diagnosis not present

## 2022-03-07 DIAGNOSIS — E1165 Type 2 diabetes mellitus with hyperglycemia: Secondary | ICD-10-CM | POA: Diagnosis not present

## 2022-03-28 DIAGNOSIS — J1189 Influenza due to unidentified influenza virus with other manifestations: Secondary | ICD-10-CM | POA: Diagnosis not present

## 2022-05-09 DIAGNOSIS — D696 Thrombocytopenia, unspecified: Secondary | ICD-10-CM | POA: Diagnosis not present

## 2022-05-09 DIAGNOSIS — I10 Essential (primary) hypertension: Secondary | ICD-10-CM | POA: Diagnosis not present

## 2022-05-09 DIAGNOSIS — E1165 Type 2 diabetes mellitus with hyperglycemia: Secondary | ICD-10-CM | POA: Diagnosis not present

## 2022-05-09 DIAGNOSIS — K219 Gastro-esophageal reflux disease without esophagitis: Secondary | ICD-10-CM | POA: Diagnosis not present

## 2022-05-09 DIAGNOSIS — E1122 Type 2 diabetes mellitus with diabetic chronic kidney disease: Secondary | ICD-10-CM | POA: Diagnosis not present

## 2022-05-09 DIAGNOSIS — Z131 Encounter for screening for diabetes mellitus: Secondary | ICD-10-CM | POA: Diagnosis not present

## 2022-05-09 DIAGNOSIS — N1831 Chronic kidney disease, stage 3a: Secondary | ICD-10-CM | POA: Diagnosis not present

## 2022-08-15 DIAGNOSIS — E1165 Type 2 diabetes mellitus with hyperglycemia: Secondary | ICD-10-CM | POA: Diagnosis not present

## 2022-08-15 DIAGNOSIS — D696 Thrombocytopenia, unspecified: Secondary | ICD-10-CM | POA: Diagnosis not present

## 2022-08-15 DIAGNOSIS — I10 Essential (primary) hypertension: Secondary | ICD-10-CM | POA: Diagnosis not present

## 2022-08-15 DIAGNOSIS — N1831 Chronic kidney disease, stage 3a: Secondary | ICD-10-CM | POA: Diagnosis not present

## 2022-08-15 DIAGNOSIS — K219 Gastro-esophageal reflux disease without esophagitis: Secondary | ICD-10-CM | POA: Diagnosis not present

## 2022-08-15 DIAGNOSIS — E1122 Type 2 diabetes mellitus with diabetic chronic kidney disease: Secondary | ICD-10-CM | POA: Diagnosis not present

## 2022-09-05 DIAGNOSIS — K219 Gastro-esophageal reflux disease without esophagitis: Secondary | ICD-10-CM | POA: Diagnosis not present

## 2022-09-05 DIAGNOSIS — I10 Essential (primary) hypertension: Secondary | ICD-10-CM | POA: Diagnosis not present

## 2022-09-05 DIAGNOSIS — D696 Thrombocytopenia, unspecified: Secondary | ICD-10-CM | POA: Diagnosis not present

## 2022-09-05 DIAGNOSIS — N1831 Chronic kidney disease, stage 3a: Secondary | ICD-10-CM | POA: Diagnosis not present

## 2022-09-05 DIAGNOSIS — E1165 Type 2 diabetes mellitus with hyperglycemia: Secondary | ICD-10-CM | POA: Diagnosis not present

## 2022-09-05 DIAGNOSIS — E1122 Type 2 diabetes mellitus with diabetic chronic kidney disease: Secondary | ICD-10-CM | POA: Diagnosis not present

## 2022-09-19 DIAGNOSIS — I10 Essential (primary) hypertension: Secondary | ICD-10-CM | POA: Diagnosis not present

## 2022-09-19 DIAGNOSIS — E1165 Type 2 diabetes mellitus with hyperglycemia: Secondary | ICD-10-CM | POA: Diagnosis not present

## 2022-12-19 DIAGNOSIS — N1831 Chronic kidney disease, stage 3a: Secondary | ICD-10-CM | POA: Diagnosis not present

## 2022-12-19 DIAGNOSIS — I10 Essential (primary) hypertension: Secondary | ICD-10-CM | POA: Diagnosis not present

## 2022-12-19 DIAGNOSIS — F5221 Male erectile disorder: Secondary | ICD-10-CM | POA: Diagnosis not present

## 2022-12-19 DIAGNOSIS — D696 Thrombocytopenia, unspecified: Secondary | ICD-10-CM | POA: Diagnosis not present

## 2022-12-19 DIAGNOSIS — E1122 Type 2 diabetes mellitus with diabetic chronic kidney disease: Secondary | ICD-10-CM | POA: Diagnosis not present

## 2022-12-19 DIAGNOSIS — Z125 Encounter for screening for malignant neoplasm of prostate: Secondary | ICD-10-CM | POA: Diagnosis not present

## 2022-12-19 DIAGNOSIS — Z0001 Encounter for general adult medical examination with abnormal findings: Secondary | ICD-10-CM | POA: Diagnosis not present

## 2022-12-19 DIAGNOSIS — K219 Gastro-esophageal reflux disease without esophagitis: Secondary | ICD-10-CM | POA: Diagnosis not present

## 2022-12-19 DIAGNOSIS — E039 Hypothyroidism, unspecified: Secondary | ICD-10-CM | POA: Diagnosis not present

## 2022-12-19 DIAGNOSIS — E1165 Type 2 diabetes mellitus with hyperglycemia: Secondary | ICD-10-CM | POA: Diagnosis not present

## 2023-01-19 DIAGNOSIS — E039 Hypothyroidism, unspecified: Secondary | ICD-10-CM | POA: Diagnosis not present

## 2023-01-19 DIAGNOSIS — E1136 Type 2 diabetes mellitus with diabetic cataract: Secondary | ICD-10-CM | POA: Diagnosis not present

## 2023-01-19 DIAGNOSIS — N1831 Chronic kidney disease, stage 3a: Secondary | ICD-10-CM | POA: Diagnosis not present

## 2023-01-19 DIAGNOSIS — Z7984 Long term (current) use of oral hypoglycemic drugs: Secondary | ICD-10-CM | POA: Diagnosis not present

## 2023-01-19 DIAGNOSIS — K219 Gastro-esophageal reflux disease without esophagitis: Secondary | ICD-10-CM | POA: Diagnosis not present

## 2023-01-19 DIAGNOSIS — N400 Enlarged prostate without lower urinary tract symptoms: Secondary | ICD-10-CM | POA: Diagnosis not present

## 2023-01-19 DIAGNOSIS — Z833 Family history of diabetes mellitus: Secondary | ICD-10-CM | POA: Diagnosis not present

## 2023-01-19 DIAGNOSIS — E1122 Type 2 diabetes mellitus with diabetic chronic kidney disease: Secondary | ICD-10-CM | POA: Diagnosis not present

## 2023-01-19 DIAGNOSIS — I129 Hypertensive chronic kidney disease with stage 1 through stage 4 chronic kidney disease, or unspecified chronic kidney disease: Secondary | ICD-10-CM | POA: Diagnosis not present

## 2023-01-19 DIAGNOSIS — Z8249 Family history of ischemic heart disease and other diseases of the circulatory system: Secondary | ICD-10-CM | POA: Diagnosis not present

## 2023-01-19 DIAGNOSIS — Z7982 Long term (current) use of aspirin: Secondary | ICD-10-CM | POA: Diagnosis not present

## 2023-01-19 DIAGNOSIS — Z87891 Personal history of nicotine dependence: Secondary | ICD-10-CM | POA: Diagnosis not present

## 2023-02-02 DIAGNOSIS — E039 Hypothyroidism, unspecified: Secondary | ICD-10-CM | POA: Diagnosis not present

## 2023-02-02 DIAGNOSIS — D696 Thrombocytopenia, unspecified: Secondary | ICD-10-CM | POA: Diagnosis not present

## 2023-02-02 DIAGNOSIS — I10 Essential (primary) hypertension: Secondary | ICD-10-CM | POA: Diagnosis not present

## 2023-02-02 DIAGNOSIS — F5221 Male erectile disorder: Secondary | ICD-10-CM | POA: Diagnosis not present

## 2023-02-02 DIAGNOSIS — N1831 Chronic kidney disease, stage 3a: Secondary | ICD-10-CM | POA: Diagnosis not present

## 2023-02-02 DIAGNOSIS — E1122 Type 2 diabetes mellitus with diabetic chronic kidney disease: Secondary | ICD-10-CM | POA: Diagnosis not present

## 2023-02-02 DIAGNOSIS — K219 Gastro-esophageal reflux disease without esophagitis: Secondary | ICD-10-CM | POA: Diagnosis not present

## 2023-02-09 ENCOUNTER — Other Ambulatory Visit: Payer: Self-pay

## 2023-02-09 ENCOUNTER — Encounter (HOSPITAL_BASED_OUTPATIENT_CLINIC_OR_DEPARTMENT_OTHER): Payer: Self-pay | Admitting: Emergency Medicine

## 2023-02-09 ENCOUNTER — Inpatient Hospital Stay (HOSPITAL_BASED_OUTPATIENT_CLINIC_OR_DEPARTMENT_OTHER)
Admission: EM | Admit: 2023-02-09 | Discharge: 2023-02-12 | DRG: 871 | Disposition: A | Discharge status: Home Health | Payer: Medicare HMO | Attending: Student | Admitting: Student

## 2023-02-09 ENCOUNTER — Emergency Department (HOSPITAL_BASED_OUTPATIENT_CLINIC_OR_DEPARTMENT_OTHER): Payer: Medicare HMO

## 2023-02-09 DIAGNOSIS — Z833 Family history of diabetes mellitus: Secondary | ICD-10-CM

## 2023-02-09 DIAGNOSIS — N1831 Chronic kidney disease, stage 3a: Secondary | ICD-10-CM | POA: Diagnosis present

## 2023-02-09 DIAGNOSIS — A419 Sepsis, unspecified organism: Principal | ICD-10-CM | POA: Diagnosis present

## 2023-02-09 DIAGNOSIS — Z7984 Long term (current) use of oral hypoglycemic drugs: Secondary | ICD-10-CM

## 2023-02-09 DIAGNOSIS — Z7982 Long term (current) use of aspirin: Secondary | ICD-10-CM

## 2023-02-09 DIAGNOSIS — E039 Hypothyroidism, unspecified: Secondary | ICD-10-CM | POA: Diagnosis present

## 2023-02-09 DIAGNOSIS — Z9049 Acquired absence of other specified parts of digestive tract: Secondary | ICD-10-CM

## 2023-02-09 DIAGNOSIS — Z1152 Encounter for screening for COVID-19: Secondary | ICD-10-CM

## 2023-02-09 DIAGNOSIS — N400 Enlarged prostate without lower urinary tract symptoms: Secondary | ICD-10-CM | POA: Diagnosis present

## 2023-02-09 DIAGNOSIS — R000 Tachycardia, unspecified: Secondary | ICD-10-CM | POA: Diagnosis not present

## 2023-02-09 DIAGNOSIS — I10 Essential (primary) hypertension: Secondary | ICD-10-CM | POA: Diagnosis present

## 2023-02-09 DIAGNOSIS — R6520 Severe sepsis without septic shock: Secondary | ICD-10-CM | POA: Diagnosis present

## 2023-02-09 DIAGNOSIS — Z79890 Hormone replacement therapy (postmenopausal): Secondary | ICD-10-CM

## 2023-02-09 DIAGNOSIS — R7989 Other specified abnormal findings of blood chemistry: Secondary | ICD-10-CM | POA: Insufficient documentation

## 2023-02-09 DIAGNOSIS — J189 Pneumonia, unspecified organism: Secondary | ICD-10-CM | POA: Diagnosis present

## 2023-02-09 DIAGNOSIS — J9601 Acute respiratory failure with hypoxia: Secondary | ICD-10-CM | POA: Diagnosis present

## 2023-02-09 DIAGNOSIS — B9789 Other viral agents as the cause of diseases classified elsewhere: Secondary | ICD-10-CM | POA: Diagnosis present

## 2023-02-09 DIAGNOSIS — I129 Hypertensive chronic kidney disease with stage 1 through stage 4 chronic kidney disease, or unspecified chronic kidney disease: Secondary | ICD-10-CM | POA: Diagnosis present

## 2023-02-09 DIAGNOSIS — R918 Other nonspecific abnormal finding of lung field: Secondary | ICD-10-CM | POA: Diagnosis not present

## 2023-02-09 DIAGNOSIS — E872 Acidosis, unspecified: Secondary | ICD-10-CM | POA: Diagnosis present

## 2023-02-09 DIAGNOSIS — J984 Other disorders of lung: Secondary | ICD-10-CM | POA: Diagnosis not present

## 2023-02-09 DIAGNOSIS — D693 Immune thrombocytopenic purpura: Secondary | ICD-10-CM | POA: Diagnosis present

## 2023-02-09 DIAGNOSIS — J168 Pneumonia due to other specified infectious organisms: Secondary | ICD-10-CM | POA: Diagnosis not present

## 2023-02-09 DIAGNOSIS — Z794 Long term (current) use of insulin: Secondary | ICD-10-CM

## 2023-02-09 DIAGNOSIS — E876 Hypokalemia: Secondary | ICD-10-CM | POA: Diagnosis not present

## 2023-02-09 DIAGNOSIS — I700 Atherosclerosis of aorta: Secondary | ICD-10-CM | POA: Diagnosis not present

## 2023-02-09 DIAGNOSIS — E1165 Type 2 diabetes mellitus with hyperglycemia: Secondary | ICD-10-CM | POA: Diagnosis present

## 2023-02-09 DIAGNOSIS — R0603 Acute respiratory distress: Secondary | ICD-10-CM | POA: Diagnosis not present

## 2023-02-09 DIAGNOSIS — R0602 Shortness of breath: Secondary | ICD-10-CM | POA: Diagnosis not present

## 2023-02-09 DIAGNOSIS — E119 Type 2 diabetes mellitus without complications: Secondary | ICD-10-CM

## 2023-02-09 DIAGNOSIS — Z79899 Other long term (current) drug therapy: Secondary | ICD-10-CM

## 2023-02-09 LAB — I-STAT VENOUS BLOOD GAS, ED
Acid-base deficit: 3 mmol/L — ABNORMAL HIGH (ref 0.0–2.0)
Bicarbonate: 19.6 mmol/L — ABNORMAL LOW (ref 20.0–28.0)
Calcium, Ion: 1.02 mmol/L — ABNORMAL LOW (ref 1.15–1.40)
HCT: 47 % (ref 39.0–52.0)
Hemoglobin: 16 g/dL (ref 13.0–17.0)
O2 Saturation: 94 %
Patient temperature: 101
Potassium: 3.5 mmol/L (ref 3.5–5.1)
Sodium: 133 mmol/L — ABNORMAL LOW (ref 135–145)
TCO2: 20 mmol/L — ABNORMAL LOW (ref 22–32)
pCO2, Ven: 31.4 mmHg — ABNORMAL LOW (ref 44–60)
pH, Ven: 7.409 (ref 7.25–7.43)
pO2, Ven: 72 mmHg — ABNORMAL HIGH (ref 32–45)

## 2023-02-09 LAB — LACTIC ACID, PLASMA
Lactic Acid, Venous: 2.3 mmol/L (ref 0.5–1.9)
Lactic Acid, Venous: 2.3 mmol/L (ref 0.5–1.9)

## 2023-02-09 LAB — PROTIME-INR
INR: 1.2 (ref 0.8–1.2)
Prothrombin Time: 15.6 s — ABNORMAL HIGH (ref 11.4–15.2)

## 2023-02-09 LAB — RESP PANEL BY RT-PCR (RSV, FLU A&B, COVID)  RVPGX2
Influenza A by PCR: NEGATIVE
Influenza B by PCR: NEGATIVE
Resp Syncytial Virus by PCR: NEGATIVE
SARS Coronavirus 2 by RT PCR: NEGATIVE

## 2023-02-09 LAB — CBC WITH DIFFERENTIAL/PLATELET
Abs Immature Granulocytes: 0.02 10*3/uL (ref 0.00–0.07)
Basophils Absolute: 0 10*3/uL (ref 0.0–0.1)
Basophils Relative: 0 %
Eosinophils Absolute: 0 10*3/uL (ref 0.0–0.5)
Eosinophils Relative: 0 %
HCT: 44.6 % (ref 39.0–52.0)
Hemoglobin: 15 g/dL (ref 13.0–17.0)
Immature Granulocytes: 0 %
Lymphocytes Relative: 15 %
Lymphs Abs: 1.3 10*3/uL (ref 0.7–4.0)
MCH: 28.4 pg (ref 26.0–34.0)
MCHC: 33.6 g/dL (ref 30.0–36.0)
MCV: 84.5 fL (ref 80.0–100.0)
Monocytes Absolute: 0.7 10*3/uL (ref 0.1–1.0)
Monocytes Relative: 8 %
Neutro Abs: 6.3 10*3/uL (ref 1.7–7.7)
Neutrophils Relative %: 77 %
Platelets: 122 10*3/uL — ABNORMAL LOW (ref 150–400)
RBC: 5.28 MIL/uL (ref 4.22–5.81)
RDW: 14.4 % (ref 11.5–15.5)
WBC: 8.3 10*3/uL (ref 4.0–10.5)
nRBC: 0 % (ref 0.0–0.2)

## 2023-02-09 LAB — BRAIN NATRIURETIC PEPTIDE: B Natriuretic Peptide: 177.7 pg/mL — ABNORMAL HIGH (ref 0.0–100.0)

## 2023-02-09 LAB — COMPREHENSIVE METABOLIC PANEL
ALT: 18 U/L (ref 0–44)
AST: 29 U/L (ref 15–41)
Albumin: 4.2 g/dL (ref 3.5–5.0)
Alkaline Phosphatase: 60 U/L (ref 38–126)
Anion gap: 13 (ref 5–15)
BUN: 19 mg/dL (ref 8–23)
CO2: 20 mmol/L — ABNORMAL LOW (ref 22–32)
Calcium: 9.1 mg/dL (ref 8.9–10.3)
Chloride: 100 mmol/L (ref 98–111)
Creatinine, Ser: 1.32 mg/dL — ABNORMAL HIGH (ref 0.61–1.24)
GFR, Estimated: 55 mL/min — ABNORMAL LOW (ref >60)
Glucose, Bld: 200 mg/dL — ABNORMAL HIGH (ref 70–99)
Potassium: 3.5 mmol/L (ref 3.5–5.1)
Sodium: 133 mmol/L — ABNORMAL LOW (ref 135–145)
Total Bilirubin: 1.3 mg/dL — ABNORMAL HIGH (ref 0.3–1.2)
Total Protein: 7.7 g/dL (ref 6.5–8.1)

## 2023-02-09 LAB — APTT: aPTT: 31 s (ref 24–36)

## 2023-02-09 MED ORDER — SODIUM CHLORIDE 0.9 % IV SOLN: 500.0000 mg | INTRAVENOUS | Status: DC

## 2023-02-09 MED ORDER — LACTATED RINGERS IV SOLN: INTRAVENOUS | Status: DC

## 2023-02-09 MED ORDER — LACTATED RINGERS IV BOLUS (SEPSIS)
1000.0000 mL | Freq: Once | INTRAVENOUS | Status: AC
  Administered 2023-02-09: 1000 mL via INTRAVENOUS

## 2023-02-09 MED ORDER — SODIUM CHLORIDE 0.9 % IV SOLN: 2.0000 g | INTRAVENOUS | Status: DC

## 2023-02-09 MED ORDER — SODIUM CHLORIDE 0.9 % IV SOLN
500.0000 mg | INTRAVENOUS | Status: DC
  Administered 2023-02-09: 500 mg via INTRAVENOUS
  Filled 2023-02-09 (×2): qty 5

## 2023-02-09 MED ORDER — ACETAMINOPHEN 500 MG PO TABS
1000.0000 mg | ORAL_TABLET | Freq: Once | ORAL | Status: AC
  Administered 2023-02-09: 1000 mg via ORAL
  Filled 2023-02-09: qty 2

## 2023-02-09 MED ORDER — IPRATROPIUM-ALBUTEROL 0.5-2.5 (3) MG/3ML IN SOLN
3.0000 mL | Freq: Once | RESPIRATORY_TRACT | Status: AC
  Administered 2023-02-09: 3 mL via RESPIRATORY_TRACT

## 2023-02-09 MED ORDER — LACTATED RINGERS IV BOLUS
1000.0000 mL | Freq: Once | INTRAVENOUS | Status: AC
  Administered 2023-02-09: 1000 mL via INTRAVENOUS

## 2023-02-09 MED ORDER — IPRATROPIUM-ALBUTEROL 0.5-2.5 (3) MG/3ML IN SOLN
3.0000 mL | Freq: Once | RESPIRATORY_TRACT | Status: AC
  Administered 2023-02-09: 3 mL via RESPIRATORY_TRACT
  Filled 2023-02-09: qty 6

## 2023-02-09 MED ORDER — SODIUM CHLORIDE 0.9 % IV SOLN
2.0000 g | INTRAVENOUS | Status: DC
  Administered 2023-02-09: 2 g via INTRAVENOUS
  Filled 2023-02-09 (×2): qty 20

## 2023-02-09 NOTE — ED Triage Notes (Signed)
Pt with SHOB since yesterday; no lung hx per pt; in distress upon arrival 83% RA

## 2023-02-09 NOTE — ED Notes (Signed)
O2 increased to 3L NC.

## 2023-02-09 NOTE — ED Notes (Signed)
Report given to Archie Patten, RN at [facility] and carelink. Patient transported to [facility] via [facility].

## 2023-02-09 NOTE — ED Notes (Signed)
[facility] called for transport @ 22:33 NO Current ETA.Marland KitchenMarland KitchenMarland Kitchen

## 2023-02-09 NOTE — ED Provider Notes (Addendum)
[facility] Provider Note  CSN: [id number] Arrival date & time: 02/09/23 2023  Chief Complaint(s) Respiratory Distress  HPI Blake Herman is a 77 y.o. male with past medical history as below, significant for DM, hypertension, thrombocytopenia who presents to the ED with complaint of cough, fever, feeling unwell  Symptoms began yesterday with intermittently productive cough, feeling short of breath primarily with exertion.  Feeling feverish, some bodyaches.  No sick contacts or recent travel.  No home oxygen use, no history of COPD or asthma.  No recent travel, no history of DVT or PE, no leg swelling.  Medications prior to arrival.  Past Medical History Past Medical History:  Diagnosis Date   Diabetes mellitus without complication (HCC)    Hypertension    Thrombocytopenia (HCC) 06/30/2011   Patient Active Problem List   Diagnosis Date Noted   COVID-19 virus infection 05/25/2019   CAP (community acquired pneumonia) 05/25/2019   Cough 05/28/2016   Stroke-like symptoms 05/28/2016   Facial droop 05/27/2016   Pain in joint of left foot 11/14/2014   Arthritis due to gout 11/14/2014   Thrombocytopenia (HCC) 06/30/2011   Diabetes (HCC) 06/10/2010   Essential hypertension, benign 06/10/2010   BACK PAIN 06/10/2010   Home Medication(s) Prior to Admission medications   Medication Sig Start Date End Date Taking? Authorizing Provider  acetaminophen (TYLENOL) 500 MG tablet Take 1,000 mg by mouth every 6 (six) hours as needed for moderate pain.    [provider]  aspirin 81 MG EC tablet Take 81 mg by mouth daily.  10/10/14   [provider]  atenolol (TENORMIN) 25 MG tablet Take 1 tablet (25 mg total) by mouth daily. 05/29/19 07/28/19  [place], Eric J, DO  cholecalciferol (VITAMIN D) 1000 units tablet Take 1,000 Units by mouth daily.    [provider]  guaiFENesin-dextromethorphan (ROBITUSSIN DM) 100-10 MG/5ML syrup Take  5 mLs by mouth every 4 (four) hours as needed for cough. 05/29/19   [place], Alvira Philips, DO  hydrochlorothiazide (MICROZIDE) 12.5 MG capsule Take 12.5 mg by mouth daily.    [provider]  levothyroxine (SYNTHROID) 25 MCG tablet Take 25 mcg by mouth daily before breakfast.    [provider]  losartan (COZAAR) 50 MG tablet Take 50 mg by mouth Daily. 06/22/11   [provider]  metFORMIN (GLUCOPHAGE) 500 MG tablet Take 500 mg by mouth 2 (two) times daily. 05/24/16   [provider]  tamsulosin (FLOMAX) 0.4 MG CAPS capsule Take 0.4 mg by mouth daily.    [provider]                                                                                                                                    Past Surgical History Past Surgical History:  Procedure Laterality Date   CHOLECYSTECTOMY     HERNIA REPAIR     Family History Family  History  Problem Relation Age of Onset   Diabetes Mother     Social History Social History   Tobacco Use   Smoking status: Never   Smokeless tobacco: Never  Substance Use Topics   Alcohol use: No    Alcohol/week: 0.0 standard drinks of alcohol   Drug use: Never   Allergies Patient has no known allergies.  Review of Systems Review of Systems  Constitutional:  Positive for fatigue and fever. Negative for chills.  HENT:  Positive for congestion.   Respiratory:  Positive for cough and shortness of breath.   Cardiovascular:  Negative for chest pain and palpitations.  Gastrointestinal:  Negative for abdominal pain, nausea and vomiting.  Genitourinary:  Negative for dysuria.  Musculoskeletal:  Positive for arthralgias.  Skin:  Negative for pallor and wound.  Neurological:  Negative for syncope and headaches.    Physical Exam Vital Signs  I have reviewed the triage vital signs BP (!) 147/84   Pulse (!) 128   Temp (!) 100.8 F (38.2 C) (Oral)   Resp (!) 27   Ht 5\' 11"  (1.803 m)   Wt 81.6 kg   SpO2 97%    BMI 25.10 kg/m  Physical Exam Vitals and nursing note reviewed.  Constitutional:      General: He is in acute distress.     Appearance: He is well-developed.  HENT:     Head: Normocephalic and atraumatic.     Right Ear: External ear normal.     Left Ear: External ear normal.     Mouth/Throat:     Mouth: Mucous membranes are dry.  Eyes:     General: No scleral icterus. Cardiovascular:     Rate and Rhythm: Regular rhythm. Tachycardia present.     Pulses: Normal pulses.     Heart sounds: Normal heart sounds.  Pulmonary:     Effort: Tachypnea and respiratory distress present.     Breath sounds: Decreased air movement and transmitted upper airway sounds present. Decreased breath sounds present.     Comments: Coarse breath sounds bilateral   Abdominal:     General: Abdomen is flat.     Palpations: Abdomen is soft.     Tenderness: There is no abdominal tenderness.  Musculoskeletal:     Cervical back: No rigidity.     Right lower leg: No edema.     Left lower leg: No edema.  Skin:    General: Skin is warm and dry.     Capillary Refill: Capillary refill takes less than 2 seconds.  Neurological:     Mental Status: He is alert.  Psychiatric:        Mood and Affect: Mood normal.        Behavior: Behavior normal.     ED Results and Treatments Labs (all labs ordered are listed, but only abnormal results are displayed) Labs Reviewed  LACTIC ACID, PLASMA - Abnormal; Notable for the following components:      Result Value   Lactic Acid, Venous 2.3 (*)    All other components within normal limits  COMPREHENSIVE METABOLIC PANEL - Abnormal; Notable for the following components:   Sodium 133 (*)    CO2 20 (*)    Glucose, Bld 200 (*)    Creatinine, Ser 1.32 (*)    Total Bilirubin 1.3 (*)    GFR, Estimated 55 (*)    All other components within normal limits  CBC WITH DIFFERENTIAL/PLATELET - Abnormal; Notable for the following components:   Platelets  122 (*)    All other  components within normal limits  PROTIME-INR - Abnormal; Notable for the following components:   Prothrombin Time 15.6 (*)    All other components within normal limits  BRAIN NATRIURETIC PEPTIDE - Abnormal; Notable for the following components:   B Natriuretic Peptide 177.7 (*)    All other components within normal limits  I-STAT VENOUS BLOOD GAS, ED - Abnormal; Notable for the following components:   pCO2, Ven 31.4 (*)    pO2, Ven 72 (*)    Bicarbonate 19.6 (*)    TCO2 20 (*)    Acid-base deficit 3.0 (*)    Sodium 133 (*)    Calcium, Ion 1.02 (*)    All other components within normal limits  RESP PANEL BY RT-PCR (RSV, FLU A&B, COVID)  RVPGX2  CULTURE, BLOOD (ROUTINE X 2)  CULTURE, BLOOD (ROUTINE X 2)  APTT  LACTIC ACID, PLASMA  URINALYSIS, W/ REFLEX TO CULTURE (INFECTION SUSPECTED)                                                                                                                          Radiology DG Chest Port 1 View  Result Date: 02/09/2023 CLINICAL DATA:  Possible sepsis, shortness of breath. EXAM: PORTABLE CHEST 1 VIEW COMPARISON:  05/25/2019. FINDINGS: The heart size and mediastinal contours are within normal limits. There is atherosclerotic calcification of the aorta. Patchy airspace disease is present at the left lung base. No effusion or pneumothorax. No acute osseous abnormality. IMPRESSION: Patchy airspace disease at the left lung base, suspicious for pneumonia. Electronically Signed   By: Thornell Sartorius M.D.   On: 02/09/2023 22:00    Pertinent labs & imaging results that were available during my care of the patient were reviewed by me and considered in my medical decision making (see MDM for details).  Medications Ordered in ED Medications  lactated ringers infusion ( Intravenous New Bag/Given 02/09/23 2107)  cefTRIAXone (ROCEPHIN) 2 g in sodium chloride 0.9 % 100 mL IVPB (0 g Intravenous Stopped 02/09/23 2135)  azithromycin (ZITHROMAX) 500 mg in sodium  chloride 0.9 % 250 mL IVPB (500 mg Intravenous New Bag/Given 02/09/23 2138)  lactated ringers bolus 1,000 mL (0 mLs Intravenous Stopped 02/09/23 2200)  ipratropium-albuterol (DUONEB) 0.5-2.5 (3) MG/3ML nebulizer solution 3 mL (3 mLs Nebulization Given 02/09/23 2107)  ipratropium-albuterol (DUONEB) 0.5-2.5 (3) MG/3ML nebulizer solution 3 mL (3 mLs Nebulization Given 02/09/23 2107)  lactated ringers bolus 1,000 mL (1,000 mLs Intravenous New Bag/Given 02/09/23 2201)  Procedures .Critical Care  Performed by: Sloan Leiter, DO Authorized by: Sloan Leiter, DO   Critical care provider statement:    Critical care time (minutes):  45   Critical care time was exclusive of:  Separately billable procedures and treating other patients   Critical care was necessary to treat or prevent imminent or life-threatening deterioration of the following conditions:  Sepsis and respiratory failure   Critical care was time spent personally by me on the following activities:  Development of treatment plan with patient or surrogate, discussions with consultants, evaluation of patient's response to treatment, examination of patient, ordering and review of laboratory studies, ordering and review of radiographic studies, ordering and performing treatments and interventions, pulse oximetry, re-evaluation of patient's condition and review of old charts   Care discussed with: admitting provider     (including critical care time)  Medical Decision Making / ED Course    Medical Decision Making:    Hunt Waltzer is a 77 y.o. male with past medical history as below, significant for DM, hypertension, thrombocytopenia who presents to the ED with complaint of cough, fever, feeling unwell. The complaint involves an extensive differential diagnosis and also carries with it a high risk of complications  and morbidity.  Serious etiology was considered. Ddx includes but is not limited to: In my evaluation of this patient's dyspnea my DDx includes, but is not limited to, pneumonia, pulmonary embolism, pneumothorax, pulmonary edema, metabolic acidosis, asthma, COPD, cardiac cause, anemia, anxiety, etc.    Complete initial physical exam performed, notably the patient  was hypoxic, respiratory distress, tachycardia.    Reviewed and confirmed nursing documentation for past medical history, family history, social history.  Vital signs reviewed.    Clinical Course as of 02/09/23 2225  Thu Feb 09, 2023  2155 Creatinine(!): 1.32 Mildly worsened from prior [SG]  2222 Spoke w/ Dr Julian Reil, accepts pt for admission  [SG]    Clinical Course User Index [SG] Sloan Leiter, DO     Patient with hypoxia on ambient air, increased to 6 L nasal cannula with improvement to 92%.  Transition patient to high flow nasal cannula with improvement to his respiratory status.  Febrile on arrival, high suspicion for pneumonia, code sepsis activated, start broad-spectrum antibiotics, sepsis bundle initiated.  He is amenable to BiPAP if needed.  Patient will require admission   Respiratory status improved on HFNC, now at 15L, no home O2 requirement  Pt with sepsis 2/2 pneumonia  He will require admission   Accepted for admit Dr Julian Reil               Additional history obtained: -Additional history obtained from family -External records from outside source obtained and reviewed including: Chart review including previous notes, labs, imaging, consultation notes including  Prior admission Prior labs Prior imaging Home meds    Lab Tests: -I ordered, reviewed, and interpreted labs.   The pertinent results include:   Labs Reviewed  LACTIC ACID, PLASMA - Abnormal; Notable for the following components:      Result Value   Lactic Acid, Venous 2.3 (*)    All other components within normal limits   COMPREHENSIVE METABOLIC PANEL - Abnormal; Notable for the following components:   Sodium 133 (*)    CO2 20 (*)    Glucose, Bld 200 (*)    Creatinine, Ser 1.32 (*)    Total Bilirubin 1.3 (*)    GFR, Estimated 55 (*)    All other components within  normal limits  CBC WITH DIFFERENTIAL/PLATELET - Abnormal; Notable for the following components:   Platelets 122 (*)    All other components within normal limits  PROTIME-INR - Abnormal; Notable for the following components:   Prothrombin Time 15.6 (*)    All other components within normal limits  BRAIN NATRIURETIC PEPTIDE - Abnormal; Notable for the following components:   B Natriuretic Peptide 177.7 (*)    All other components within normal limits  I-STAT VENOUS BLOOD GAS, ED - Abnormal; Notable for the following components:   pCO2, Ven 31.4 (*)    pO2, Ven 72 (*)    Bicarbonate 19.6 (*)    TCO2 20 (*)    Acid-base deficit 3.0 (*)    Sodium 133 (*)    Calcium, Ion 1.02 (*)    All other components within normal limits  RESP PANEL BY RT-PCR (RSV, FLU A&B, COVID)  RVPGX2  CULTURE, BLOOD (ROUTINE X 2)  CULTURE, BLOOD (ROUTINE X 2)  APTT  LACTIC ACID, PLASMA  URINALYSIS, W/ REFLEX TO CULTURE (INFECTION SUSPECTED)    Notable for LA +  EKG   EKG Interpretation Date/Time:  Thursday February 09 2023 20:32:59 EDT Ventricular Rate:  117 PR Interval:  146 QRS Duration:  79 QT Interval:  290 QTC Calculation: 403 R Axis:   10  Text Interpretation: Sinus tachycardia Consider right atrial enlargement Low voltage, extremity leads Interpretation limited secondary to artifact Confirmed by Tanda Rockers ([id number]) on 02/09/2023 9:59:56 PM         Imaging Studies ordered: I ordered imaging studies including CXR I independently visualized the following imaging with scope of interpretation limited to determining acute life threatening conditions related to emergency care; findings noted above, significant for PNA I independently visualized and  interpreted imaging. I agree with the radiologist interpretation   Medicines ordered and prescription drug management: Meds ordered this encounter  Medications   lactated ringers infusion   lactated ringers bolus 1,000 mL    Order Specific Question:   Reason 30 mL/kg dose is not being ordered    Answer:   First Lactic Acid Pending   cefTRIAXone (ROCEPHIN) 2 g in sodium chloride 0.9 % 100 mL IVPB    Order Specific Question:   Antibiotic Indication:    Answer:   CAP   azithromycin (ZITHROMAX) 500 mg in sodium chloride 0.9 % 250 mL IVPB    Order Specific Question:   Antibiotic Indication:    Answer:   CAP   ipratropium-albuterol (DUONEB) 0.5-2.5 (3) MG/3ML nebulizer solution 3 mL   ipratropium-albuterol (DUONEB) 0.5-2.5 (3) MG/3ML nebulizer solution 3 mL   lactated ringers bolus 1,000 mL    -I have reviewed the patients home medicines and have made adjustments as needed   Consultations Obtained: na   Cardiac Monitoring: The patient was maintained on a cardiac monitor.  I personally viewed and interpreted the cardiac monitored which showed an underlying rhythm of: sinus tachy   Social Determinants of Health:  Diagnosis or treatment significantly limited by social determinants of health: na   Reevaluation: After the interventions noted above, I reevaluated the patient and found that they have improved  Co morbidities that complicate the patient evaluation  Past Medical History:  Diagnosis Date   Diabetes mellitus without complication (HCC)    Hypertension    Thrombocytopenia (HCC) 06/30/2011      Dispostion: Disposition decision including need for hospitalization was considered, and patient admitted to the hospital.    Final Clinical Impression(s) / ED  Diagnoses Final diagnoses:  Sepsis, due to unspecified organism, unspecified whether acute organ dysfunction present [facility])  Respiratory distress  Pneumonia due to infectious organism, unspecified laterality,  unspecified part of lung        Sloan Leiter, DO 02/09/23 2224    Sloan Leiter, DO 02/09/23 2225

## 2023-02-09 NOTE — Sepsis Progress Note (Signed)
Elink monitoring for the code sepsis protocol.  

## 2023-02-10 ENCOUNTER — Inpatient Hospital Stay (HOSPITAL_COMMUNITY): Payer: Medicare HMO

## 2023-02-10 ENCOUNTER — Encounter (HOSPITAL_COMMUNITY): Payer: Self-pay | Admitting: Internal Medicine

## 2023-02-10 ENCOUNTER — Other Ambulatory Visit: Payer: Self-pay

## 2023-02-10 DIAGNOSIS — R7989 Other specified abnormal findings of blood chemistry: Secondary | ICD-10-CM | POA: Diagnosis not present

## 2023-02-10 DIAGNOSIS — J189 Pneumonia, unspecified organism: Secondary | ICD-10-CM | POA: Diagnosis not present

## 2023-02-10 DIAGNOSIS — I10 Essential (primary) hypertension: Secondary | ICD-10-CM | POA: Diagnosis not present

## 2023-02-10 DIAGNOSIS — E876 Hypokalemia: Secondary | ICD-10-CM | POA: Diagnosis not present

## 2023-02-10 DIAGNOSIS — R0609 Other forms of dyspnea: Secondary | ICD-10-CM | POA: Diagnosis not present

## 2023-02-10 DIAGNOSIS — E119 Type 2 diabetes mellitus without complications: Secondary | ICD-10-CM

## 2023-02-10 DIAGNOSIS — Z7982 Long term (current) use of aspirin: Secondary | ICD-10-CM | POA: Diagnosis not present

## 2023-02-10 DIAGNOSIS — B9789 Other viral agents as the cause of diseases classified elsewhere: Secondary | ICD-10-CM | POA: Diagnosis not present

## 2023-02-10 DIAGNOSIS — E1165 Type 2 diabetes mellitus with hyperglycemia: Secondary | ICD-10-CM | POA: Diagnosis not present

## 2023-02-10 DIAGNOSIS — Z833 Family history of diabetes mellitus: Secondary | ICD-10-CM | POA: Diagnosis not present

## 2023-02-10 DIAGNOSIS — Z794 Long term (current) use of insulin: Secondary | ICD-10-CM | POA: Diagnosis not present

## 2023-02-10 DIAGNOSIS — Z79890 Hormone replacement therapy (postmenopausal): Secondary | ICD-10-CM | POA: Diagnosis not present

## 2023-02-10 DIAGNOSIS — E039 Hypothyroidism, unspecified: Secondary | ICD-10-CM | POA: Diagnosis not present

## 2023-02-10 DIAGNOSIS — N1831 Chronic kidney disease, stage 3a: Secondary | ICD-10-CM | POA: Diagnosis not present

## 2023-02-10 DIAGNOSIS — D693 Immune thrombocytopenic purpura: Secondary | ICD-10-CM | POA: Insufficient documentation

## 2023-02-10 DIAGNOSIS — A419 Sepsis, unspecified organism: Secondary | ICD-10-CM | POA: Diagnosis not present

## 2023-02-10 DIAGNOSIS — Z79899 Other long term (current) drug therapy: Secondary | ICD-10-CM | POA: Diagnosis not present

## 2023-02-10 DIAGNOSIS — Z7984 Long term (current) use of oral hypoglycemic drugs: Secondary | ICD-10-CM | POA: Diagnosis not present

## 2023-02-10 DIAGNOSIS — R6520 Severe sepsis without septic shock: Secondary | ICD-10-CM | POA: Diagnosis not present

## 2023-02-10 DIAGNOSIS — J9601 Acute respiratory failure with hypoxia: Secondary | ICD-10-CM | POA: Diagnosis not present

## 2023-02-10 DIAGNOSIS — I129 Hypertensive chronic kidney disease with stage 1 through stage 4 chronic kidney disease, or unspecified chronic kidney disease: Secondary | ICD-10-CM | POA: Diagnosis not present

## 2023-02-10 DIAGNOSIS — N400 Enlarged prostate without lower urinary tract symptoms: Secondary | ICD-10-CM | POA: Diagnosis not present

## 2023-02-10 DIAGNOSIS — E872 Acidosis, unspecified: Secondary | ICD-10-CM | POA: Diagnosis not present

## 2023-02-10 DIAGNOSIS — E038 Other specified hypothyroidism: Secondary | ICD-10-CM

## 2023-02-10 DIAGNOSIS — Z9049 Acquired absence of other specified parts of digestive tract: Secondary | ICD-10-CM | POA: Diagnosis not present

## 2023-02-10 DIAGNOSIS — Z1152 Encounter for screening for COVID-19: Secondary | ICD-10-CM | POA: Diagnosis not present

## 2023-02-10 LAB — GLUCOSE, CAPILLARY
Glucose-Capillary: 138 mg/dL — ABNORMAL HIGH (ref 70–99)
Glucose-Capillary: 141 mg/dL — ABNORMAL HIGH (ref 70–99)
Glucose-Capillary: 149 mg/dL — ABNORMAL HIGH (ref 70–99)
Glucose-Capillary: 153 mg/dL — ABNORMAL HIGH (ref 70–99)
Glucose-Capillary: 172 mg/dL — ABNORMAL HIGH (ref 70–99)
Glucose-Capillary: 194 mg/dL — ABNORMAL HIGH (ref 70–99)
Glucose-Capillary: 245 mg/dL — ABNORMAL HIGH (ref 70–99)

## 2023-02-10 LAB — URINALYSIS, W/ REFLEX TO CULTURE (INFECTION SUSPECTED)
Bacteria, UA: NONE SEEN
Bilirubin Urine: NEGATIVE
Glucose, UA: 50 mg/dL — AB
Ketones, ur: 5 mg/dL — AB
Leukocytes,Ua: NEGATIVE
Nitrite: NEGATIVE
Protein, ur: NEGATIVE mg/dL
Specific Gravity, Urine: 1.017 (ref 1.005–1.030)
pH: 5 (ref 5.0–8.0)

## 2023-02-10 LAB — RESPIRATORY PANEL BY PCR

## 2023-02-10 LAB — COMPREHENSIVE METABOLIC PANEL
ALT: 16 U/L (ref 0–44)
AST: 27 U/L (ref 15–41)
Albumin: 2.9 g/dL — ABNORMAL LOW (ref 3.5–5.0)
Alkaline Phosphatase: 40 U/L (ref 38–126)
Anion gap: 10 (ref 5–15)
BUN: 15 mg/dL (ref 8–23)
CO2: 22 mmol/L (ref 22–32)
Calcium: 8.1 mg/dL — ABNORMAL LOW (ref 8.9–10.3)
Chloride: 100 mmol/L (ref 98–111)
Creatinine, Ser: 1.4 mg/dL — ABNORMAL HIGH (ref 0.61–1.24)
GFR, Estimated: 51 mL/min — ABNORMAL LOW (ref >60)
Glucose, Bld: 241 mg/dL — ABNORMAL HIGH (ref 70–99)
Potassium: 3.4 mmol/L — ABNORMAL LOW (ref 3.5–5.1)
Sodium: 132 mmol/L — ABNORMAL LOW (ref 135–145)
Total Bilirubin: 1 mg/dL (ref 0.3–1.2)
Total Protein: 5.8 g/dL — ABNORMAL LOW (ref 6.5–8.1)

## 2023-02-10 LAB — ECHOCARDIOGRAM COMPLETE
AR max vel: 2.82 cm2
AV Peak grad: 6 mmHg
Ao pk vel: 1.22 m/s
Area-P 1/2: 3.99 cm2
Height: 71 in
S' Lateral: 3.1 cm
Weight: 2881.85 [oz_av]

## 2023-02-10 LAB — CBC
HCT: 37.2 % — ABNORMAL LOW (ref 39.0–52.0)
Hemoglobin: 12.4 g/dL — ABNORMAL LOW (ref 13.0–17.0)
MCH: 28 pg (ref 26.0–34.0)
MCHC: 33.3 g/dL (ref 30.0–36.0)
MCV: 84 fL (ref 80.0–100.0)
Platelets: 93 10*3/uL — ABNORMAL LOW (ref 150–400)
RBC: 4.43 MIL/uL (ref 4.22–5.81)
RDW: 14.3 % (ref 11.5–15.5)
WBC: 4.6 10*3/uL (ref 4.0–10.5)
nRBC: 0 % (ref 0.0–0.2)

## 2023-02-10 LAB — MAGNESIUM: Magnesium: 1.3 mg/dL — ABNORMAL LOW (ref 1.7–2.4)

## 2023-02-10 LAB — EXPECTORATED SPUTUM ASSESSMENT W GRAM STAIN, RFLX TO RESP C: Special Requests: NORMAL

## 2023-02-10 LAB — PROCALCITONIN: Procalcitonin: 6.86 ng/mL

## 2023-02-10 LAB — LACTIC ACID, PLASMA
Lactic Acid, Venous: 2.5 mmol/L (ref 0.5–1.9)
Lactic Acid, Venous: 2.6 mmol/L (ref 0.5–1.9)

## 2023-02-10 LAB — MRSA NEXT GEN BY PCR, NASAL: MRSA by PCR Next Gen: NOT DETECTED

## 2023-02-10 LAB — STREP PNEUMONIAE URINARY ANTIGEN: Strep Pneumo Urinary Antigen: NEGATIVE

## 2023-02-10 MED ORDER — LEVALBUTEROL HCL 0.63 MG/3ML IN NEBU
0.6300 mg | INHALATION_SOLUTION | Freq: Four times a day (QID) | RESPIRATORY_TRACT | Status: DC
  Administered 2023-02-10 – 2023-02-12 (×9): 0.63 mg via RESPIRATORY_TRACT
  Filled 2023-02-10 (×9): qty 3

## 2023-02-10 MED ORDER — TAMSULOSIN HCL 0.4 MG PO CAPS
0.4000 mg | ORAL_CAPSULE | Freq: Every day | ORAL | Status: DC
  Administered 2023-02-10 – 2023-02-12 (×3): 0.4 mg via ORAL
  Filled 2023-02-10 (×3): qty 1

## 2023-02-10 MED ORDER — LEVOTHYROXINE SODIUM 25 MCG PO TABS
25.0000 ug | ORAL_TABLET | Freq: Every day | ORAL | Status: DC
  Administered 2023-02-10 – 2023-02-12 (×3): 25 ug via ORAL
  Filled 2023-02-10 (×3): qty 1

## 2023-02-10 MED ORDER — ONDANSETRON HCL 4 MG/2ML IJ SOLN: 4.0000 mg | Freq: Four times a day (QID) | INTRAMUSCULAR | Status: DC | PRN

## 2023-02-10 MED ORDER — SODIUM CHLORIDE 0.9 % IV SOLN
1.0000 g | INTRAVENOUS | Status: DC
  Administered 2023-02-10 – 2023-02-11 (×2): 1 g via INTRAVENOUS
  Filled 2023-02-10 (×2): qty 10

## 2023-02-10 MED ORDER — POTASSIUM CHLORIDE IN NACL 20-0.9 MEQ/L-% IV SOLN
INTRAVENOUS | Status: DC
  Filled 2023-02-10 (×3): qty 1000

## 2023-02-10 MED ORDER — VANCOMYCIN HCL 1250 MG/250ML IV SOLN: 1250.0000 mg | INTRAVENOUS | Status: DC

## 2023-02-10 MED ORDER — LACTATED RINGERS IV SOLN: INTRAVENOUS | Status: DC

## 2023-02-10 MED ORDER — POTASSIUM CHLORIDE 10 MEQ/100ML IV SOLN
10.0000 meq | INTRAVENOUS | Status: AC
  Administered 2023-02-10 (×2): 10 meq via INTRAVENOUS
  Filled 2023-02-10 (×2): qty 100

## 2023-02-10 MED ORDER — HEPARIN SODIUM (PORCINE) 5000 UNIT/ML IJ SOLN
5000.0000 [IU] | Freq: Three times a day (TID) | INTRAMUSCULAR | Status: DC
  Administered 2023-02-10 – 2023-02-12 (×7): 5000 [IU] via SUBCUTANEOUS
  Filled 2023-02-10 (×7): qty 1

## 2023-02-10 MED ORDER — VANCOMYCIN HCL 1500 MG/300ML IV SOLN
1500.0000 mg | INTRAVENOUS | Status: AC
  Administered 2023-02-10: 1500 mg via INTRAVENOUS
  Filled 2023-02-10: qty 300

## 2023-02-10 MED ORDER — ONDANSETRON HCL 4 MG PO TABS: 4.0000 mg | ORAL_TABLET | Freq: Four times a day (QID) | ORAL | Status: DC | PRN

## 2023-02-10 MED ORDER — SODIUM CHLORIDE 0.9 % IV SOLN: 250.0000 mL | INTRAVENOUS | Status: DC | PRN

## 2023-02-10 MED ORDER — ACETAMINOPHEN 650 MG RE SUPP: 650.0000 mg | Freq: Four times a day (QID) | RECTAL | Status: DC | PRN

## 2023-02-10 MED ORDER — ACETAMINOPHEN 325 MG PO TABS
650.0000 mg | ORAL_TABLET | Freq: Four times a day (QID) | ORAL | Status: DC | PRN
  Administered 2023-02-10: 650 mg via ORAL
  Filled 2023-02-10: qty 2

## 2023-02-10 MED ORDER — MAGNESIUM SULFATE 2 GM/50ML IV SOLN
2.0000 g | Freq: Once | INTRAVENOUS | Status: AC
  Administered 2023-02-10: 2 g via INTRAVENOUS
  Filled 2023-02-10: qty 50

## 2023-02-10 MED ORDER — PIPERACILLIN-TAZOBACTAM 3.375 G IVPB
3.3750 g | Freq: Three times a day (TID) | INTRAVENOUS | Status: DC
  Administered 2023-02-10: 3.375 g via INTRAVENOUS
  Filled 2023-02-10 (×2): qty 50

## 2023-02-10 MED ORDER — ACETAMINOPHEN 10 MG/ML IV SOLN
1000.0000 mg | INTRAVENOUS | Status: AC
  Administered 2023-02-10: 1000 mg via INTRAVENOUS
  Filled 2023-02-10: qty 100

## 2023-02-10 MED ORDER — INSULIN ASPART 100 UNIT/ML IJ SOLN
0.0000 [IU] | Freq: Four times a day (QID) | INTRAMUSCULAR | Status: DC | PRN
  Administered 2023-02-10 – 2023-02-11 (×3): 1 [IU] via SUBCUTANEOUS

## 2023-02-10 MED ORDER — SENNOSIDES-DOCUSATE SODIUM 8.6-50 MG PO TABS: 1.0000 | ORAL_TABLET | Freq: Every evening | ORAL | Status: DC | PRN

## 2023-02-10 MED ORDER — SODIUM CHLORIDE 0.9 % IV SOLN
500.0000 mg | INTRAVENOUS | Status: DC
  Administered 2023-02-10: 500 mg via INTRAVENOUS
  Filled 2023-02-10 (×2): qty 5

## 2023-02-10 MED ORDER — SODIUM CHLORIDE 0.9% FLUSH: 3.0000 mL | INTRAVENOUS | Status: DC | PRN

## 2023-02-10 MED ORDER — SODIUM CHLORIDE 0.9% FLUSH
3.0000 mL | Freq: Two times a day (BID) | INTRAVENOUS | Status: DC
  Administered 2023-02-10 – 2023-02-11 (×4): 3 mL via INTRAVENOUS

## 2023-02-10 MED ORDER — ENOXAPARIN SODIUM 40 MG/0.4ML IJ SOSY: 40.0000 mg | PREFILLED_SYRINGE | INTRAMUSCULAR | Status: DC

## 2023-02-10 MED ORDER — FUROSEMIDE 10 MG/ML IJ SOLN
20.0000 mg | Freq: Once | INTRAMUSCULAR | Status: AC
  Administered 2023-02-10: 20 mg via INTRAVENOUS
  Filled 2023-02-10: qty 2

## 2023-02-10 MED ORDER — GUAIFENESIN ER 600 MG PO TB12
600.0000 mg | ORAL_TABLET | Freq: Two times a day (BID) | ORAL | Status: DC
  Administered 2023-02-10 – 2023-02-12 (×6): 600 mg via ORAL
  Filled 2023-02-10 (×6): qty 1

## 2023-02-10 MED ORDER — DEXTROSE 50 % IV SOLN: 1.0000 | Freq: Every day | INTRAVENOUS | Status: DC | PRN

## 2023-02-10 MED ORDER — ASPIRIN 81 MG PO TBEC
81.0000 mg | DELAYED_RELEASE_TABLET | Freq: Every day | ORAL | Status: DC
  Administered 2023-02-10 – 2023-02-12 (×3): 81 mg via ORAL
  Filled 2023-02-10 (×3): qty 1

## 2023-02-10 MED ORDER — IPRATROPIUM-ALBUTEROL 0.5-2.5 (3) MG/3ML IN SOLN: 3.0000 mL | RESPIRATORY_TRACT | Status: DC | PRN

## 2023-02-10 NOTE — Plan of Care (Signed)

## 2023-02-10 NOTE — Progress Notes (Signed)
SLP Cancellation Note  Patient Details Name: Blake Herman MRN: [id number] DOB: 20-Mar-1946   Cancelled treatment:        Swallow evaluation orders received and appreicated. Attempted to see pt for swallow assessment, but he is NPO at this time for procedure.  SLP will return as schedule permits when pt can participate in PO trials.    Kerrie Pleasure, MA, CCC-SLP Acute Rehabilitation Services Office: [phone] 02/10/2023, 9:54 AM

## 2023-02-10 NOTE — Progress Notes (Signed)
Received pt from [facility] to 2C04.  Patient A&Ox4.  VSS, Patient on 10L HFNC.  Sats 93%.  Patient noted to have coarse, wet sounding respirations, thick wet cough.  Oral suction given to patient and encouraged to cough sputum up.  Sputum thick and tan.  Dr. Eduard Roux paged and came to room.  MEWS red, Rapid response notified.  Daughter in law in room and updated on patient status.  Oxygen sats remain stable, RR 20-30's.

## 2023-02-10 NOTE — Progress Notes (Signed)
PROGRESS NOTE  Blake Herman ZOX:[id number] DOB: Jul 16, 1945   PCP: Jackie Plum, MD  Patient is from: Home.  Independently ambulates at baseline.  DOA: 02/09/2023 LOS: 0  Chief complaints Chief Complaint  Patient presents with   Respiratory Distress     Brief Narrative / Interim history: 77 year old M with PMH of thrombocytopenia, HTN, DM-2, hypothyroidism and BPH presenting with sore throat, fever, cough, DOE and not feeling well for 2 to 3 days, and admitted for severe sepsis with acute hypoxic respiratory failure in the setting of left lower lobe pneumonia.  He required up to 15 L by HFNC.  BNP 177.  ABG without significant finding.  COVID-19, influenza and RSV PCR nonreactive.  Pro-Cal elevated.  CXR with airspace disease in LLL.  Cultures obtained.  Echocardiogram ordered.  Started on broad-spectrum antibiotics, nebulizers and IV fluid.   The next day, oxygen requirement and symptoms improved.  MRSA PCR and blood cultures negative.  Antibiotic de-escalated to ceftriaxone and Zithromax.  RVP ordered.   Subjective: Seen and examined earlier this morning.  No major events overnight of this morning.  Reports feeling better.  He states his chest is less tight.  Cough has improved.  He is still rhonchorous and gurgly but daughter-in-law reports significant improvement.  Denies nausea or vomiting.  Admits to sore throat but denies runny nose.  Denies sick contact.  Objective: Vitals:   02/10/23 0500 02/10/23 0720 02/10/23 0751 02/10/23 1112  BP: (!) 143/61 112/66  99/84  Pulse: 90 80 82 82  Resp: (!) 32 (!) 25 (!) 31 (!) 28  Temp: 99.4 F (37.4 C) 99 F (37.2 C)  99.2 F (37.3 C)  TempSrc: Oral Oral  Oral  SpO2: 96% 98% 99% 97%  Weight: 81.7 kg     Height:        Examination:  GENERAL: No apparent distress.  Nontoxic. HEENT: MMM.  Vision and hearing grossly intact.  NECK: Supple.  No apparent JVD.  RESP:  No IWOB.  Very rhonchorous. CVS:  RRR. Heart sounds normal.   ABD/GI/GU: BS+. Abd soft, NTND.  MSK/EXT:  Moves extremities. No apparent deformity. No edema.  SKIN: no apparent skin lesion or wound NEURO: Awake, alert and oriented appropriately.  No apparent focal neuro deficit. PSYCH: Calm. Normal affect.   Procedures:  None  Microbiology summarized: COVID-19, influenza and RSV PCR nonreactive MRSA PCR screen negative Blood cultures NGTD  Assessment and plan: Principal Problem:   Severe sepsis (HCC) Active Problems:   CAP (community acquired pneumonia)   Acute hypoxic respiratory failure (HCC)   Diabetes type 2, controlled (HCC)   Essential hypertension   Pneumonia   Chronic idiopathic thrombocytopenia (HCC)   Hypothyroidism   Elevated brain natriuretic peptide (BNP) level  Severe sepsis with acute hypoxic respiratory failure due to community-acquired pneumonia: Symptoms seems to have started with sore throat suggesting some viral illness although he has no runny nose.  Reports improvement in his symptoms.  Oxygen requirement improved.  Able to wean to 5 L this morning.  He is still very rhonchorous.  Culture data as above.  Pro-Cal markedly elevated. -De-escalate antibiotic to ceftriaxone and Zithromax -Continue nebulizers, mucolytic's and antitussive as needed -Check RVP.  Follow urine Legionella and sputum culture. -SLP eval -IS/OOB/PT/OT    Mildly elevated BNP: Appears euvolemic on exam.  No history of CHF.  Received IV Lasix 20 mg x 1 -Follow echocardiogram.     Essential hypertension: Normotensive this morning. -Continue holding home atenolol, Hyzaar  Chronic idiopathic thrombocytopenia: Relatively stable -Continue monitoring  Uncontrolled NIDDM-2 with hyperglycemia: On metformin and glimepiride at home. Recent Labs  Lab 02/10/23 0045 02/10/23 0345 02/10/23 0546 02/10/23 0722 02/10/23 1111  GLUCAP 194* 245* 172* 149* 153*  -Continue current insulin regimen. -Follow hemoglobin A1c  CKD-3A?  Unknown baseline.   Last known creatinine 1.15 in 2020. Recent Labs    02/09/23 2045 02/10/23 0236  BUN 19 15  CREATININE 1.32* 1.40*  -Continue holding Hyzaar -Continue IV fluid   History of hypothyroidism -Continue levothyroxine 25 mcg daily   History of BPH without LUTS -Continue Flomax 0.4 mg daily  Hypokalemia/hypomagnesemia -Monitor replenish as appropriate  Body mass index is 25.12 kg/m.           DVT prophylaxis:  heparin injection 5,000 Units Start: 02/10/23 0600 SCDs Start: 02/10/23 0031  Code Status: Full code Family Communication: Updated patient's daughter-in-law at bedside Level of care: Progressive Status is: Inpatient Remains inpatient appropriate because: Severe sepsis with hypoxic respiratory failure due to community-acquired pneumonia   Final disposition: Likely home Consultants:  None  55 minutes with more than 50% spent in reviewing records, counseling patient/family and coordinating care.   Sch Meds:  Scheduled Meds:  aspirin EC  81 mg Oral Daily   guaiFENesin  600 mg Oral BID   heparin injection (subcutaneous)  5,000 Units Subcutaneous Q8H   levalbuterol  0.63 mg Nebulization Q6H   levothyroxine  25 mcg Oral QAC breakfast   sodium chloride flush  3 mL Intravenous Q12H   tamsulosin  0.4 mg Oral Daily   Continuous Infusions:  sodium chloride     0.9 % NaCl with KCl 20 mEq / L 125 mL/hr at 02/10/23 1100   azithromycin     cefTRIAXone (ROCEPHIN)  IV     PRN Meds:.sodium chloride, acetaminophen **OR** acetaminophen, dextrose, insulin aspart, ipratropium-albuterol, ondansetron **OR** ondansetron (ZOFRAN) IV, senna-docusate, sodium chloride flush  Antimicrobials: Anti-infectives (From admission, onward)    Start     Dose/Rate Route Frequency Ordered Stop   02/11/23 0200  vancomycin (VANCOREADY) IVPB 1250 mg/250 mL  Status:  Discontinued        1,250 mg 166.7 mL/hr over 90 Minutes Intravenous Every 24 hours 02/10/23 0054 02/10/23 0713   02/10/23 2100   azithromycin (ZITHROMAX) 500 mg in sodium chloride 0.9 % 250 mL IVPB  Status:  Discontinued        500 mg 250 mL/hr over 60 Minutes Intravenous Every 24 hours 02/09/23 2353 02/10/23 0032   02/10/23 2100  cefTRIAXone (ROCEPHIN) 2 g in sodium chloride 0.9 % 100 mL IVPB  Status:  Discontinued        2 g 200 mL/hr over 30 Minutes Intravenous Every 24 hours 02/09/23 2353 02/10/23 0032   02/10/23 2100  azithromycin (ZITHROMAX) 500 mg in sodium chloride 0.9 % 250 mL IVPB        500 mg 250 mL/hr over 60 Minutes Intravenous Every 24 hours 02/10/23 0713 02/15/23 2059   02/10/23 2000  cefTRIAXone (ROCEPHIN) 1 g in sodium chloride 0.9 % 100 mL IVPB        1 g 200 mL/hr over 30 Minutes Intravenous Every 24 hours 02/10/23 0713 02/15/23 1959   02/10/23 0200  piperacillin-tazobactam (ZOSYN) IVPB 3.375 g  Status:  Discontinued        3.375 g 12.5 mL/hr over 240 Minutes Intravenous Every 8 hours 02/10/23 0054 02/10/23 0713   02/10/23 0130  vancomycin (VANCOREADY) IVPB 1500 mg/300 mL  1,500 mg 150 mL/hr over 120 Minutes Intravenous NOW 02/10/23 0054 02/10/23 0321   02/09/23 2045  cefTRIAXone (ROCEPHIN) 2 g in sodium chloride 0.9 % 100 mL IVPB  Status:  Discontinued        2 g 200 mL/hr over 30 Minutes Intravenous Every 24 hours 02/09/23 2044 02/09/23 2353   02/09/23 2045  azithromycin (ZITHROMAX) 500 mg in sodium chloride 0.9 % 250 mL IVPB  Status:  Discontinued        500 mg 250 mL/hr over 60 Minutes Intravenous Every 24 hours 02/09/23 2044 02/09/23 2353        I have personally reviewed the following labs and images: CBC: Recent Labs  Lab 02/09/23 2045 02/09/23 2105 02/10/23 0236  WBC 8.3  --  4.6  NEUTROABS 6.3  --   --   HGB 15.0 16.0 12.4*  HCT 44.6 47.0 37.2*  MCV 84.5  --  84.0  PLT 122*  --  93*   BMP &GFR Recent Labs  Lab 02/09/23 2045 02/09/23 2105 02/10/23 0236  NA 133* 133* 132*  K 3.5 3.5 3.4*  CL 100  --  100  CO2 20*  --  22  GLUCOSE 200*  --  241*  BUN 19  --   15  CREATININE 1.32*  --  1.40*  CALCIUM 9.1  --  8.1*  MG  --   --  1.3*   Estimated Creatinine Clearance: 45.6 mL/min (A) (by C-G formula based on SCr of 1.4 mg/dL (H)). Liver & Pancreas: Recent Labs  Lab 02/09/23 2045 02/10/23 0236  AST 29 27  ALT 18 16  ALKPHOS 60 40  BILITOT 1.3* 1.0  PROT 7.7 5.8*  ALBUMIN 4.2 2.9*   No results for input(s): "LIPASE", "AMYLASE" in the last 168 hours. No results for input(s): "AMMONIA" in the last 168 hours. Diabetic: No results for input(s): "HGBA1C" in the last 72 hours. Recent Labs  Lab 02/10/23 0045 02/10/23 0345 02/10/23 0546 02/10/23 0722 02/10/23 1111  GLUCAP 194* 245* 172* 149* 153*   Cardiac Enzymes: No results for input(s): "CKTOTAL", "CKMB", "CKMBINDEX", "TROPONINI" in the last 168 hours. No results for input(s): "PROBNP" in the last 8760 hours. Coagulation Profile: Recent Labs  Lab 02/09/23 2045  INR 1.2   Thyroid Function Tests: No results for input(s): "TSH", "T4TOTAL", "FREET4", "T3FREE", "THYROIDAB" in the last 72 hours. Lipid Profile: No results for input(s): "CHOL", "HDL", "LDLCALC", "TRIG", "CHOLHDL", "LDLDIRECT" in the last 72 hours. Anemia Panel: No results for input(s): "VITAMINB12", "FOLATE", "FERRITIN", "TIBC", "IRON", "RETICCTPCT" in the last 72 hours. Urine analysis:    Component Value Date/Time   COLORURINE YELLOW 02/10/2023 0051   APPEARANCEUR CLEAR 02/10/2023 0051   LABSPEC 1.017 02/10/2023 0051   PHURINE 5.0 02/10/2023 0051   GLUCOSEU 50 (A) 02/10/2023 0051   HGBUR MODERATE (A) 02/10/2023 0051   BILIRUBINUR NEGATIVE 02/10/2023 0051   KETONESUR 5 (A) 02/10/2023 0051   PROTEINUR NEGATIVE 02/10/2023 0051   NITRITE NEGATIVE 02/10/2023 0051   LEUKOCYTESUR NEGATIVE 02/10/2023 0051   Sepsis Labs: Invalid input(s): "PROCALCITONIN", "LACTICIDVEN"  Microbiology: Recent Results (from the past 240 hour(s))  Resp panel by RT-PCR (RSV, Flu A&B, Covid)     Status: None   Collection Time: 02/09/23   8:45 PM   Specimen: Nasal Swab  Result Value Ref Range Status   SARS Coronavirus 2 by RT PCR NEGATIVE NEGATIVE Final    Comment: (NOTE) SARS-CoV-2 target nucleic acids are NOT DETECTED.  The SARS-CoV-2 RNA is generally detectable  in upper respiratory specimens during the acute phase of infection. The lowest concentration of SARS-CoV-2 viral copies this assay can detect is 138 copies/mL. A negative result does not preclude SARS-Cov-2 infection and should not be used as the sole basis for treatment or other patient management decisions. A negative result may occur with  improper specimen collection/handling, submission of specimen other than nasopharyngeal swab, presence of viral mutation(s) within the areas targeted by this assay, and inadequate number of viral copies(<138 copies/mL). A negative result must be combined with clinical observations, patient history, and epidemiological information. The expected result is Negative.  Fact Sheet for Patients:  BloggerCourse.com  Fact Sheet for Healthcare Providers:  SeriousBroker.it  This test is no t yet approved or cleared by the [place] FDA and  has been authorized for detection and/or diagnosis of SARS-CoV-2 by FDA under an Emergency Use Authorization (EUA). This EUA will remain  in effect (meaning this test can be used) for the duration of the COVID-19 declaration under Section 564(b)(1) of the Act, 21 U.S.C.section 360bbb-3(b)(1), unless the authorization is terminated  or revoked sooner.       Influenza A by PCR NEGATIVE NEGATIVE Final   Influenza B by PCR NEGATIVE NEGATIVE Final    Comment: (NOTE) The Xpert Xpress SARS-CoV-2/FLU/RSV plus assay is intended as an aid in the diagnosis of influenza from Nasopharyngeal swab specimens and should not be used as a sole basis for treatment. Nasal washings and aspirates are unacceptable for Xpert Xpress  SARS-CoV-2/FLU/RSV testing.  Fact Sheet for Patients: BloggerCourse.com  Fact Sheet for Healthcare Providers: SeriousBroker.it  This test is not yet approved or cleared by the [place] FDA and has been authorized for detection and/or diagnosis of SARS-CoV-2 by FDA under an Emergency Use Authorization (EUA). This EUA will remain in effect (meaning this test can be used) for the duration of the COVID-19 declaration under Section 564(b)(1) of the Act, 21 U.S.C. section 360bbb-3(b)(1), unless the authorization is terminated or revoked.     Resp Syncytial Virus by PCR NEGATIVE NEGATIVE Final    Comment: (NOTE) Fact Sheet for Patients: BloggerCourse.com  Fact Sheet for Healthcare Providers: SeriousBroker.it  This test is not yet approved or cleared by the [place] FDA and has been authorized for detection and/or diagnosis of SARS-CoV-2 by FDA under an Emergency Use Authorization (EUA). This EUA will remain in effect (meaning this test can be used) for the duration of the COVID-19 declaration under Section 564(b)(1) of the Act, 21 U.S.C. section 360bbb-3(b)(1), unless the authorization is terminated or revoked.  Performed at [facility], [address] [place], [place] [address]   Blood Culture (routine x 2)     Status: None (Preliminary result)   Collection Time: 02/09/23  8:45 PM   Specimen: BLOOD  Result Value Ref Range Status   Specimen Description   Final    BLOOD LEFT ANTECUBITAL Performed at [facility], [address] [place], [place] [address]    Special Requests   Final    BOTTLES DRAWN AEROBIC AND ANAEROBIC Blood Culture adequate volume Performed at [facility], [address] [place], [place] [address]    Culture   Final    NO GROWTH < 12 HOURS Performed at [facility], [address], [place], [place]  [address]    Report Status PENDING  Incomplete  Blood Culture (routine x 2)     Status: None (Preliminary result)   Collection  Time: 02/09/23  8:50 PM   Specimen: BLOOD RIGHT FOREARM  Result Value Ref Range Status   Specimen Description   Final    BLOOD RIGHT FOREARM Performed at [facility], [address], [place], [place] [address]    Special Requests   Final    BOTTLES DRAWN AEROBIC AND ANAEROBIC Blood Culture results may not be optimal due to an excessive volume of blood received in culture bottles Performed at [facility], [address] [place], [place] [address]    Culture   Final    NO GROWTH < 12 HOURS Performed at [facility], [address] [place], [place] [address]    Report Status PENDING  Incomplete  Culture, blood (Routine X 2) w Reflex to ID Panel     Status: None (Preliminary result)   Collection Time: 02/10/23 12:59 AM   Specimen: BLOOD RIGHT HAND  Result Value Ref Range Status   Specimen Description BLOOD RIGHT HAND  Final   Special Requests   Final    BOTTLES DRAWN AEROBIC AND ANAEROBIC Blood Culture adequate volume   Culture   Final    NO GROWTH < 12 HOURS Performed at [facility], [address], [place], [place] [address]    Report Status PENDING  Incomplete  Culture, blood (Routine X 2) w Reflex to ID Panel     Status: None (Preliminary result)   Collection Time: 02/10/23  1:00 AM   Specimen: BLOOD RIGHT HAND  Result Value Ref Range Status   Specimen Description BLOOD RIGHT HAND  Final   Special Requests   Final    BOTTLES DRAWN AEROBIC AND ANAEROBIC Blood Culture adequate volume   Culture   Final    NO GROWTH < 12 HOURS Performed at [facility], [address] [place], [place] [address]    Report Status PENDING  Incomplete  MRSA Next Gen by PCR, Nasal     Status: None   Collection Time: 02/10/23  1:25 AM  Result Value Ref Range Status   MRSA by PCR Next Gen NOT DETECTED NOT DETECTED Final    Comment:  (NOTE) The GeneXpert MRSA Assay (FDA approved for NASAL specimens only), is one component of a comprehensive MRSA colonization surveillance program. It is not intended to diagnose MRSA infection nor to guide or monitor treatment for MRSA infections. Test performance is not FDA approved in patients less than 38 years old. Performed at [facility], [address] [place], [place] [address]     Radiology Studies: DG Chest Port 1 View  Result Date: 02/09/2023 CLINICAL DATA:  Possible sepsis, shortness of breath. EXAM: PORTABLE CHEST 1 VIEW COMPARISON:  05/25/2019. FINDINGS: The heart size and mediastinal contours are within normal limits. There is atherosclerotic calcification of the aorta. Patchy airspace disease is present at the left lung base. No effusion or pneumothorax. No acute osseous abnormality. IMPRESSION: Patchy airspace disease at the left lung base, suspicious for pneumonia. Electronically Signed   By: Thornell Sartorius M.D.   On: 02/09/2023 22:00      Sherita Decoste T. Aurie Harroun [facility]  If 7PM-7AM, please contact night-coverage [email or web address] 02/10/2023, 11:57 AM

## 2023-02-10 NOTE — Progress Notes (Signed)
Pharmacy Antibiotic Note  Blake Herman is a 77 y.o. male admitted on 02/09/2023 with pneumonia.  Pharmacy has been consulted for Vancomycin and Zosyn dosing.  Plan: Zosyn 3.375gm IV q8h Vancomycin 1500 mg IV now then  1250 mg IV Q 24 hrs. Goal AUC 400-550. Expected AUC: 494 SCr used: 1.32 Will f/u renal function, micro data, and pt's clinical condition Vanc levels prn Will add MRSA PCR and narrow abx as appropriate   Height: 5\' 11"  (180.3 cm) Weight: 78.9 kg (173 lb 15.1 oz) IBW/kg (Calculated) : 75.3  Temp (24hrs), Avg:100.9 F (38.3 C), Min:100.5 F (38.1 C), Max:101.3 F (38.5 C)  Recent Labs  Lab 02/09/23 2045 02/09/23 2231  WBC 8.3  --   CREATININE 1.32*  --   LATICACIDVEN 2.3* 2.3*    Estimated Creatinine Clearance: 48.3 mL/min (A) (by C-G formula based on SCr of 1.32 mg/dL (H)).    No Known Allergies  Antimicrobials this admission: 9/12 Ceftriaxone/Azith x1 9/13 Vanc >>  9/13 Zosyn >>   Microbiology results: 9/12 BCx:   Thank you for allowing pharmacy to be a part of this patient's care.  Christoper Fabian, PharmD, BCPS Please see amion for complete clinical pharmacist phone list 02/10/2023 12:49 AM

## 2023-02-10 NOTE — Progress Notes (Signed)
RT instructed patient on the use of a flutter valve. Patient able to demonstrate back good technique with a productive cough.

## 2023-02-10 NOTE — H&P (Signed)
History and Physical    Blake Herman:[id number] DOB: [birth year] DOA: 02/09/2023  PCP: Jackie Plum, MD   Patient coming from: Home   Chief Complaint:  Chief Complaint  Patient presents with   Respiratory Distress    HPI:  Blake Herman is a 77 y.o. male with medical history significant of significant for thrombocytopenia, essential hypertension and DM type II scented to emergency department with complaining of fever, cough and not feeling well for last few days.  Patient reported that her symptom has been a started intermittently with productive cough and feeling short of breath with exertion for last 24 hours.  Patient also reported feeling feverish and had some body ache.  Denies any sick contact.  Denies any chest pain, palpitation, headache, sinus pain, sinus pressure, nausea, vomiting, abdominal pain and diarrhea.   ED Course:  In the ED initial presentation patient found to be have high temperature 100.1 F, tachycardic 117, tachypneic respiratory rate 40 and O2 sat 89% on 2 L.  Patient has been placed on high flow nasal cannula oxygen 15 L which improved O2 sat to 100% tachypnea has been improved to 30.  ED workup: Respiratory panel negative.  MRSA PCR negative. Found to have elevated lactic acid 2.3.  Patient received total 2 L of LR bolus however patient lactic acid still remained elevated to 2.3.  CBC showed WBC 8.3, RBC 5.2, hemoglobin 15, hematocrit 44, low platelet 122 (baseline platelet in between 71-92) Elevated pro time 15.6.  Normal INR 1.2 to APTT 31. Blood culture is in process. Elevated BNP 177 (unknown baseline) VBG showed pH 7.4, pCO2 31, pO2 72, bicarb 19. Elevated Pro-Cal 6.86.  Chest x-ray showed patchy airspace disease at the left lung base suspicious for pneumonia.  No effusion or pneumothorax.  In the ED patient has been treated with DuoNeb nebulizer x 2 doses.  2 L LR bolus and currently on LR 150 cc/h.  Received azithromycin and  ceftriaxone. Patient has been transferred from [facility] to to [facility] for management of pneumonia.  During my evaluation, patient has productive cough.  Bilateral upper and lower lung fields diffuse rales and rhonchi. Currently patient is on high flow nasal cannula oxygen 10 L maintaining O2 sat 94% to 97 percent.   Review of Systems:  Review of Systems  Constitutional:  Negative for chills, diaphoresis, fever, malaise/fatigue and weight loss.  Respiratory:  Positive for cough, sputum production and shortness of breath. Negative for wheezing.   Cardiovascular:  Negative for chest pain and palpitations.  Gastrointestinal:  Negative for abdominal pain, heartburn, nausea and vomiting.  Musculoskeletal:  Negative for myalgias and neck pain.  Neurological:  Negative for dizziness and headaches.  Psychiatric/Behavioral:  The patient is not nervous/anxious.     Past Medical History:  Diagnosis Date   Diabetes mellitus without complication (HCC)    Hypertension    Thrombocytopenia (HCC) 06/30/2011    Past Surgical History:  Procedure Laterality Date   CHOLECYSTECTOMY     HERNIA REPAIR       reports that he has never smoked. He has never used smokeless tobacco. He reports that he does not drink alcohol and does not use drugs.  No Known Allergies  Family History  Problem Relation Age of Onset   Diabetes Mother     Prior to Admission medications   Medication Sig Start Date End Date Taking? Authorizing Provider  acetaminophen (TYLENOL) 500 MG tablet Take 1,000 mg by mouth every 6 (six)  hours as needed for moderate pain.    [provider]  aspirin 81 MG EC tablet Take 81 mg by mouth daily.  10/10/14   [provider]  atenolol (TENORMIN) 25 MG tablet Take 1 tablet (25 mg total) by mouth daily. 05/29/19 07/28/19  [place], Eric J, DO  cholecalciferol (VITAMIN D) 1000 units tablet Take 1,000 Units by mouth daily.    [provider]   guaiFENesin-dextromethorphan (ROBITUSSIN DM) 100-10 MG/5ML syrup Take 5 mLs by mouth every 4 (four) hours as needed for cough. 05/29/19   [place], Alvira Philips, DO  hydrochlorothiazide (MICROZIDE) 12.5 MG capsule Take 12.5 mg by mouth daily.    [provider]  levothyroxine (SYNTHROID) 25 MCG tablet Take 25 mcg by mouth daily before breakfast.    [provider]  losartan (COZAAR) 50 MG tablet Take 50 mg by mouth Daily. 06/22/11   [provider]  metFORMIN (GLUCOPHAGE) 500 MG tablet Take 500 mg by mouth 2 (two) times daily. 05/24/16   [provider]  tamsulosin (FLOMAX) 0.4 MG CAPS capsule Take 0.4 mg by mouth daily.    [provider]     Physical Exam: Vitals:   02/09/23 2350 02/10/23 0000 02/10/23 0200 02/10/23 0335  BP: (!) 150/70 119/82 136/62 112/72  Pulse: (!) 120 (!) 116 (!) 116 (!) 115  Resp: (!) 29 (!) 22 (!) 30 19  Temp: (!) 100.5 F (38.1 C)  99.5 F (37.5 C) (!) 101 F (38.3 C)  TempSrc: Oral  Oral Oral  SpO2: 94% 94% 96% 97%  Weight: 78.9 kg     Height: 5\' 11"  (1.803 m)       Physical Exam Constitutional:      General: He is not in acute distress.    Appearance: He is ill-appearing.  HENT:     Nose: Nose normal. No congestion.     Mouth/Throat:     Mouth: Mucous membranes are dry.  Eyes:     Pupils: Pupils are equal, round, and reactive to light.  Cardiovascular:     Rate and Rhythm: Regular rhythm. Tachycardia present.     Pulses: Normal pulses.     Heart sounds: Normal heart sounds.  Pulmonary:     Breath sounds: Wheezing, rhonchi and rales present.  Chest:     Chest wall: No tenderness.  Abdominal:     General: Bowel sounds are normal.  Musculoskeletal:     Cervical back: Neck supple.     Right lower leg: No edema.     Left lower leg: No edema.  Skin:    General: Skin is dry.     Capillary Refill: Capillary refill takes less than 2 seconds.  Neurological:     Mental Status: He is oriented to person,  place, and time.  Psychiatric:        Mood and Affect: Mood normal.        Thought Content: Thought content normal.        Judgment: Judgment normal.      Labs on Admission: I have personally reviewed following labs and imaging studies  CBC: Recent Labs  Lab 02/09/23 2045 02/09/23 2105 02/10/23 0236  WBC 8.3  --  4.6  NEUTROABS 6.3  --   --   HGB 15.0 16.0 12.4*  HCT 44.6 47.0 37.2*  MCV 84.5  --  84.0  PLT 122*  --  93*   Basic Metabolic Panel: Recent Labs  Lab 02/09/23 2045 02/09/23 2105 02/10/23 0236  NA 133* 133* 132*  K 3.5 3.5 3.4*  CL 100  --  100  CO2 20*  --  22  GLUCOSE 200*  --  241*  BUN 19  --  15  CREATININE 1.32*  --  1.40*  CALCIUM 9.1  --  8.1*   GFR: Estimated Creatinine Clearance: 45.6 mL/min (A) (by C-G formula based on SCr of 1.4 mg/dL (H)). Liver Function Tests: Recent Labs  Lab 02/09/23 2045 02/10/23 0236  AST 29 27  ALT 18 16  ALKPHOS 60 40  BILITOT 1.3* 1.0  PROT 7.7 5.8*  ALBUMIN 4.2 2.9*   No results for input(s): "LIPASE", "AMYLASE" in the last 168 hours. No results for input(s): "AMMONIA" in the last 168 hours. Coagulation Profile: Recent Labs  Lab 02/09/23 2045  INR 1.2   Cardiac Enzymes: No results for input(s): "CKTOTAL", "CKMB", "CKMBINDEX", "TROPONINI", "TROPONINIHS" in the last 168 hours. BNP (last 3 results) Recent Labs    02/09/23 2045  BNP 177.7*   HbA1C: No results for input(s): "HGBA1C" in the last 72 hours. CBG: Recent Labs  Lab 02/10/23 0045  GLUCAP 194*   Lipid Profile: No results for input(s): "CHOL", "HDL", "LDLCALC", "TRIG", "CHOLHDL", "LDLDIRECT" in the last 72 hours. Thyroid Function Tests: No results for input(s): "TSH", "T4TOTAL", "FREET4", "T3FREE", "THYROIDAB" in the last 72 hours. Anemia Panel: No results for input(s): "VITAMINB12", "FOLATE", "FERRITIN", "TIBC", "IRON", "RETICCTPCT" in the last 72 hours. Urine analysis:    Component Value Date/Time   COLORURINE YELLOW 02/10/2023  0051   APPEARANCEUR CLEAR 02/10/2023 0051   LABSPEC 1.017 02/10/2023 0051   PHURINE 5.0 02/10/2023 0051   GLUCOSEU 50 (A) 02/10/2023 0051   HGBUR MODERATE (A) 02/10/2023 0051   BILIRUBINUR NEGATIVE 02/10/2023 0051   KETONESUR 5 (A) 02/10/2023 0051   PROTEINUR NEGATIVE 02/10/2023 0051   NITRITE NEGATIVE 02/10/2023 0051   LEUKOCYTESUR NEGATIVE 02/10/2023 0051    Radiological Exams on Admission: I have personally reviewed images DG Chest Port 1 View  Result Date: 02/09/2023 CLINICAL DATA:  Possible sepsis, shortness of breath. EXAM: PORTABLE CHEST 1 VIEW COMPARISON:  05/25/2019. FINDINGS: The heart size and mediastinal contours are within normal limits. There is atherosclerotic calcification of the aorta. Patchy airspace disease is present at the left lung base. No effusion or pneumothorax. No acute osseous abnormality. IMPRESSION: Patchy airspace disease at the left lung base, suspicious for pneumonia. Electronically Signed   By: Thornell Sartorius M.D.   On: 02/09/2023 22:00    EKG: My personal interpretation of EKG shows: EKG sinus tachycardia heart rate 117.    Assessment/Plan: Principal Problem:   Severe sepsis (HCC) Active Problems:   CAP (community acquired pneumonia)   Acute hypoxic respiratory failure (HCC)   Diabetes type 2, controlled (HCC)   Essential hypertension   Pneumonia   Chronic idiopathic thrombocytopenia (HCC)   Hypothyroidism   Elevated brain natriuretic peptide (BNP) level    Assessment and Plan: Severe sepsis Acute hypoxic respiratory failure due to pneumonia Community-acquired pneumonia Concern for aspiration pneumonia - Presenting with sudden sudden onset of productive cough, fever and chills for last 1 days. - Elevated temperature, tachycardic, elevated lactic acid and chest x-ray showed evidence of pneumonia.  Patient meets severe sepsis criteria. -Elevated lactic acid 2.5 and elevated Pro-Cal 6.86. In the ED patient has been resuscitated with 2 L of LR  bolus.  And treated with ceftriaxone azithromycin 1 dose. -Given patient is persistently tachycardic, tachypneic and physical exam showed bilateral upper and lower lung field  diffuse rhonchi and rales and requirement of very high flow oxygen 10 L starting with broad-spectrum coverage. -Continue broad-spectrum coverage with vancomycin and Zosyn pharmacy consult. -Continue maintenance fluid LR 125 cc/h for 1 more day.  Given patient has diffuse rales bilateral lower lung field giving 1 dose of Lasix 20 mg once. - Currently on high flow nasal cannula oxygen 10 L maintaining O2 sat 93 to 94%.  Continue oxygen and will wean down as patient tolerates. - Continue Xopenex every every 6 hours scheduled and DuoNeb as needed for wheezing shortness of breath. -Obtaining blood culture, sputum culture, urine Legionella and urine Streptococcus antigen -Continue Mucinex twice daily - Continue chest PT, bedside spirometry and flutter valve. - Continue aspiration precaution. - Keep the head of the bed above 30 degree angle. - Continue to monitor fever curve. - Will follow-up with blood culture, sputum culture result for appropriate antibiotic guidance.  Elevated BNP - Lung exam showed bilateral lower lung field rales. -Elevated BNP 199.  Chest x-ray showed concern for pneumonia there is no evidence of pleural effusion. - Giving patient 1 dose of Lasix 20 mg IV. -Obtain echocardiogram.   Essential hypertension -Patient's blood pressure is labile.  Systolic blood pressure ranging between 112-150 and diastolic blood pressure ranging between 60-74.   -At home patient takes hydrochlorothiazide 12.5 mg daily and losartan 50 mg daily.  Holding blood pressure regimen in the setting of sepsis.  Chronic idiopathic thrombocytopenia -Platelet count 112.  Patient's baseline platelet around 70 to 84. Patient does not have any active submucosal bleeding - Continue to monitor platelet count. - Given patient is on  pharmacological prophylaxis with heparin 5000 mg 3 times daily continue to monitor blood counts closely.  DM type II -Unable to find any A1c on the chart.  At home patient is on metformin 500 mg twice daily.  Currently NPO to acute hypoxic respiratory failure and concern for aspiration. - Will resume diet as appropriate. - Continue maintenance fluid LR. - Continue to check POC blood glucose every 6 hours and as needed D50 bolus.  History of hypothyroidism -Continue levothyroxine 25 mcg daily  History of BPH -Continue Flomax 0.4 mg daily   DVT prophylaxis: SCD and heparin 5000 3 times daily Code Status:  Full Code Diet: Currently n.p.o. as risk for aspiration Family Communication: Discussed treatment plan with patient daughter at bedside Disposition Plan: Pending clinical improvement.  Tentative discharge to home next 3 to 4 days Consults:  none Admission status:   Inpatient, progressive unit  Severity of Illness: The appropriate patient status for this patient is INPATIENT. Inpatient status is judged to be reasonable and necessary in order to provide the required intensity of service to ensure the patient's safety. The patient's presenting symptoms, physical exam findings, and initial radiographic and laboratory data in the context of their chronic comorbidities is felt to place them at high risk for further clinical deterioration. Furthermore, it is not anticipated that the patient will be medically stable for discharge from the hospital within 2 midnights of admission.   * I certify that at the point of admission it is my clinical judgment that the patient will require inpatient hospital care spanning beyond 2 midnights from the point of admission due to high intensity of service, high risk for further deterioration and high frequency of surveillance required.Marland Kitchen    Tereasa Coop, MD [facility]  How to contact the [facility] Attending or Consulting provider 7A - 7P or covering  provider during after hours 7P -  7A, for this patient.  Check the care team in [facility] and look for a) attending/consulting TR[facility] provider listed and b) the [facility] team listed Log into [email or web address] and use [place]'s universal password to access. If you do not have the password, please contact the hospital operator. Locate the [facility] provider you are looking for under [facility] and page to a number that you can be directly reached. If you still have difficulty reaching the provider, please page the [facility] (Director on Call) for the Hospitalists listed on amion for assistance.  02/10/2023, 3:50 AM

## 2023-02-10 NOTE — Progress Notes (Signed)
Echocardiogram 2D Echocardiogram has been performed.  Blake Herman 02/10/2023, 11:04 AM

## 2023-02-10 NOTE — Progress Notes (Signed)
   02/09/23 2350  Assess: MEWS Score  Temp (!) 100.5 F (38.1 C)  BP (!) 150/70  MAP (mmHg) 108  Pulse Rate (!) 120  ECG Heart Rate (!) 120  Resp (!) 29  Level of Consciousness Alert  SpO2 94 %  O2 Device HFNC  O2 Flow Rate (L/min) 10 L/min  Assess: MEWS Score  MEWS Temp 1  MEWS Systolic 0  MEWS Pulse 2  MEWS RR 2  MEWS LOC 0  MEWS Score 5  MEWS Score Color Red  Assess: if the MEWS score is Yellow or Red  Were vital signs accurate and taken at a resting state? Yes  Does the patient meet 2 or more of the SIRS criteria? Yes  Does the patient have a confirmed or suspected source of infection? Yes  MEWS guidelines implemented  Yes, red  Treat  MEWS Interventions Considered administering scheduled or prn medications/treatments as ordered  Take Vital Signs  Increase Vital Sign Frequency  Red: Q1hr x2, continue Q4hrs until patient remains green for 12hrs  Escalate  MEWS: Escalate Red: Discuss with charge nurse and notify provider. Consider notifying RRT. If remains red for 2 hours consider need for higher level of care  Notify: Charge Nurse/RN  Name of Charge Nurse/RN Notified [facility]  Provider Notification  Provider Name/Title Dr. Eduard Roux  Date Provider Notified 02/10/23  Time Provider Notified 0000  Method of Notification Call  Notification Reason Change in status  Provider response En route  Date of Provider Response 02/10/23  Time of Provider Response 0010  Notify: Rapid Response  Name of Rapid Response RN Notified Mindy  Date Rapid Response Notified 02/10/23  Time Rapid Response Notified 0027  Assess: SIRS CRITERIA  SIRS Temperature  0  SIRS Pulse 1  SIRS Respirations  1  SIRS WBC 0  SIRS Score Sum  2

## 2023-02-10 NOTE — Progress Notes (Addendum)
  Patient's nurse reported that patient blood glucose elevated 245. -Starting POC blood glucose checks every 6 hours and low sliding scale insulin every 6 hour as needed.  Addendum - Morning lab check showed low potassium 3.4.  Replating with IV KCl 10 x 2 mEq.   - Magnesium 1.3, replating with IV mag sulfate 2 g.  Tereasa Coop, MD [facility] 02/10/2023, 3:56 AM

## 2023-02-10 NOTE — Plan of Care (Signed)

## 2023-02-11 DIAGNOSIS — A419 Sepsis, unspecified organism: Secondary | ICD-10-CM | POA: Diagnosis not present

## 2023-02-11 DIAGNOSIS — R7989 Other specified abnormal findings of blood chemistry: Secondary | ICD-10-CM | POA: Diagnosis not present

## 2023-02-11 DIAGNOSIS — E039 Hypothyroidism, unspecified: Secondary | ICD-10-CM

## 2023-02-11 DIAGNOSIS — I10 Essential (primary) hypertension: Secondary | ICD-10-CM | POA: Diagnosis not present

## 2023-02-11 LAB — RENAL FUNCTION PANEL
Albumin: 2.6 g/dL — ABNORMAL LOW (ref 3.5–5.0)
Anion gap: 9 (ref 5–15)
BUN: 12 mg/dL (ref 8–23)
CO2: 23 mmol/L (ref 22–32)
Calcium: 7.9 mg/dL — ABNORMAL LOW (ref 8.9–10.3)
Chloride: 102 mmol/L (ref 98–111)
Creatinine, Ser: 1.21 mg/dL (ref 0.61–1.24)
GFR, Estimated: >60 mL/min (ref >60)
Glucose, Bld: 120 mg/dL — ABNORMAL HIGH (ref 70–99)
Phosphorus: <1 mg/dL — CL (ref 2.5–4.6)
Potassium: 4 mmol/L (ref 3.5–5.1)
Sodium: 134 mmol/L — ABNORMAL LOW (ref 135–145)

## 2023-02-11 LAB — COMPREHENSIVE METABOLIC PANEL
ALT: 23 U/L (ref 0–44)
AST: 38 U/L (ref 15–41)
Albumin: 2.7 g/dL — ABNORMAL LOW (ref 3.5–5.0)
Alkaline Phosphatase: 38 U/L (ref 38–126)
Anion gap: 9 (ref 5–15)
BUN: 13 mg/dL (ref 8–23)
CO2: 23 mmol/L (ref 22–32)
Calcium: 7.9 mg/dL — ABNORMAL LOW (ref 8.9–10.3)
Chloride: 102 mmol/L (ref 98–111)
Creatinine, Ser: 1.3 mg/dL — ABNORMAL HIGH (ref 0.61–1.24)
GFR, Estimated: 56 mL/min — ABNORMAL LOW (ref >60)
Glucose, Bld: 121 mg/dL — ABNORMAL HIGH (ref 70–99)
Potassium: 4 mmol/L (ref 3.5–5.1)
Sodium: 134 mmol/L — ABNORMAL LOW (ref 135–145)
Total Bilirubin: 0.6 mg/dL (ref 0.3–1.2)
Total Protein: 5.6 g/dL — ABNORMAL LOW (ref 6.5–8.1)

## 2023-02-11 LAB — MAGNESIUM: Magnesium: 1.8 mg/dL (ref 1.7–2.4)

## 2023-02-11 LAB — CBC
HCT: 36.2 % — ABNORMAL LOW (ref 39.0–52.0)
Hemoglobin: 12.1 g/dL — ABNORMAL LOW (ref 13.0–17.0)
MCH: 28.7 pg (ref 26.0–34.0)
MCHC: 33.4 g/dL (ref 30.0–36.0)
MCV: 86 fL (ref 80.0–100.0)
Platelets: 90 10*3/uL — ABNORMAL LOW (ref 150–400)
RBC: 4.21 MIL/uL — ABNORMAL LOW (ref 4.22–5.81)
RDW: 14.7 % (ref 11.5–15.5)
WBC: 5.6 10*3/uL (ref 4.0–10.5)
nRBC: 0 % (ref 0.0–0.2)

## 2023-02-11 LAB — GLUCOSE, CAPILLARY
Glucose-Capillary: 106 mg/dL — ABNORMAL HIGH (ref 70–99)
Glucose-Capillary: 140 mg/dL — ABNORMAL HIGH (ref 70–99)
Glucose-Capillary: 178 mg/dL — ABNORMAL HIGH (ref 70–99)
Glucose-Capillary: 139 mg/dL — ABNORMAL HIGH (ref 70–99)

## 2023-02-11 LAB — PROCALCITONIN: Procalcitonin: 11.55 ng/mL

## 2023-02-11 LAB — EXPECTORATED SPUTUM ASSESSMENT W GRAM STAIN, RFLX TO RESP C

## 2023-02-11 LAB — HEMOGLOBIN A1C
Hgb A1c MFr Bld: 6.3 % — ABNORMAL HIGH (ref 4.8–5.6)
Mean Plasma Glucose: 134 mg/dL

## 2023-02-11 MED ORDER — LABETALOL HCL 5 MG/ML IV SOLN: 10.0000 mg | INTRAVENOUS | Status: DC | PRN

## 2023-02-11 MED ORDER — SODIUM PHOSPHATES 45 MMOLE/15ML IV SOLN
30.0000 mmol | Freq: Once | INTRAVENOUS | Status: AC
  Administered 2023-02-11: 30 mmol via INTRAVENOUS
  Filled 2023-02-11: qty 10

## 2023-02-11 MED ORDER — AZITHROMYCIN 500 MG PO TABS
500.0000 mg | ORAL_TABLET | Freq: Every evening | ORAL | Status: DC
  Administered 2023-02-11: 500 mg via ORAL
  Filled 2023-02-11: qty 1

## 2023-02-11 MED ORDER — LOSARTAN POTASSIUM 50 MG PO TABS
50.0000 mg | ORAL_TABLET | Freq: Every day | ORAL | Status: DC
  Administered 2023-02-11 – 2023-02-12 (×2): 50 mg via ORAL
  Filled 2023-02-11 (×2): qty 1

## 2023-02-11 MED ORDER — ATENOLOL 50 MG PO TABS
50.0000 mg | ORAL_TABLET | Freq: Every day | ORAL | Status: DC
  Administered 2023-02-11 – 2023-02-12 (×2): 50 mg via ORAL
  Filled 2023-02-11 (×2): qty 1

## 2023-02-11 NOTE — Progress Notes (Signed)
Patient weaned off HFNC to [place] on 2 Litres. Patient tolerating well, O2 sat at 96%.

## 2023-02-11 NOTE — Evaluation (Signed)
Clinical/Bedside Swallow Evaluation Patient Details  Name: Blake Herman MRN: [id number] Date of Birth: 1945/06/07  Today's Date: 02/11/2023 Time: SLP Start Time (ACUTE ONLY): 1445 SLP Stop Time (ACUTE ONLY): 1456 SLP Time Calculation (min) (ACUTE ONLY): 11 min  Past Medical History:  Past Medical History:  Diagnosis Date   Diabetes mellitus without complication (HCC)    Hypertension    Thrombocytopenia (HCC) 06/30/2011   Past Surgical History:  Past Surgical History:  Procedure Laterality Date   CHOLECYSTECTOMY     HERNIA REPAIR     HPI:  Pt is a 77 y.o. male who presented with sore throat, fever, cough, DOE and not feeling well for 2 to 3 days, and admitted for severe sepsis with acute hypoxic respiratory failure in the setting of left lower lobe pneumonia (confirmed via CXR 02/09/23). PMH: thrombocytopenia, HTN, DM-2, hypothyroidism and BPH.    Assessment / Plan / Recommendation  Clinical Impression  Pt presents with normal oropharyngeal swallow at bedside, without clinical indications for dysphagia. Oral mechanism examination WFL. Baseline vocal quality wet, but cleared with strong volitional cough. Trials of thin liquids by straw (single/consecutive sips), bites of puree x3 and regular textured solid consumed without clinical s/sx of aspiration to follow. Oral cavity cleared swiftly of all POs. No concerns at this time for oropharyngeal dysphagia. Recommend advance to regular diet/thin liquids (which is baseline diet per son/pt) with adherence to universal swallow precautions. No further SLP f/u indicated at this time. Will s/o.  SLP Visit Diagnosis: Dysphagia, unspecified (R13.10)    Aspiration Risk  No limitations    Diet Recommendation Regular;Thin liquid    Liquid Administration via: Cup;Straw Medication Administration: Whole meds with liquid Supervision: Patient able to self feed Compensations: Slow rate;Small sips/bites Postural Changes: Seated upright at 90 degrees     Other  Recommendations Oral Care Recommendations: Oral care BID    Recommendations for follow up therapy are one component of a multi-disciplinary discharge planning process, led by the attending physician.  Recommendations may be updated based on patient status, additional functional criteria and insurance authorization.  Follow up Recommendations No SLP follow up      Assistance Recommended at Discharge    Functional Status Assessment Patient has not had a recent decline in their functional status  Frequency and Duration            Prognosis        Swallow Study   General Date of Onset: 02/09/23 HPI: Pt is a 77 y.o. male who presented with sore throat, fever, cough, DOE and not feeling well for 2 to 3 days, and admitted for severe sepsis with acute hypoxic respiratory failure in the setting of left lower lobe pneumonia (confirmed via CXR 02/09/23). PMH: thrombocytopenia, HTN, DM-2, hypothyroidism and BPH. Type of Study: Bedside Swallow Evaluation Previous Swallow Assessment: none per EMR Diet Prior to this Study: Dysphagia 3 (mechanical soft);Thin liquids (Level 0) Temperature Spikes Noted: Yes (102.8) Respiratory Status: Room air History of Recent Intubation: No Behavior/Cognition: Alert;Cooperative;Pleasant mood Oral Cavity Assessment: Within Functional Limits Oral Care Completed by SLP: No Oral Cavity - Dentition: Dentures, top;Dentures, bottom Vision: Functional for self-feeding Self-Feeding Abilities: Able to feed self Patient Positioning: Upright in chair;Postural control adequate for testing Baseline Vocal Quality: Wet Volitional Cough: Strong;Congested;Wet Volitional Swallow: Able to elicit    Oral/Motor/Sensory Function Overall Oral Motor/Sensory Function: Within functional limits   Ice Chips Ice chips: Not tested   Thin Liquid Thin Liquid: Within functional limits Presentation: Straw;Self Fed  Nectar Thick Nectar Thick Liquid: Not tested   Honey Thick Honey  Thick Liquid: Not tested   Puree Puree: Within functional limits Presentation: Spoon;Self Fed   Solid     Solid: Within functional limits Presentation: Self Fed       Avie Echevaria, MA, CCC-SLP Acute Rehabilitation Services Office Number: [phone]  Paulette Blanch 02/11/2023,3:09 PM

## 2023-02-11 NOTE — Progress Notes (Signed)
PROGRESS NOTE  Blake Herman NFA:[id number] DOB: Jul 09, 1945   PCP: Jackie Plum, MD  Patient is from: Home.  Independently ambulates at baseline.  DOA: 02/09/2023 LOS: 1  Chief complaints Chief Complaint  Patient presents with   Respiratory Distress     Brief Narrative / Interim history: 77 year old M with PMH of thrombocytopenia, HTN, DM-2, hypothyroidism and BPH presenting with sore throat, fever, cough, DOE and not feeling well for 2 to 3 days, and admitted for severe sepsis with acute hypoxic respiratory failure in the setting of left lower lobe pneumonia.  He required up to 15 L by HFNC.  BNP 177.  ABG without significant finding.  COVID-19, influenza and RSV PCR nonreactive.  Pro-Cal elevated.  CXR with airspace disease in LLL.  Cultures obtained.  Echocardiogram ordered.  Started on broad-spectrum antibiotics, nebulizers and IV fluid.   The next day, oxygen requirement and symptoms improved.  MRSA PCR and blood cultures negative.  Antibiotic de-escalated to ceftriaxone and Zithromax.  RVP positive for rhinovirus.  Procalcitonin elevated.  Patient is improving from respiratory standpoint.  Currently on 2 L.  Remains on ceftriaxone and Zithromax   Subjective: Seen and examined earlier this morning.  No major events overnight of this morning.  Reports feeling better.  Still with rhonchi.  Sore throat has improved.  Denies shortness of breath.  Currently on 2 L by nasal cannula.  Objective: Vitals:   02/11/23 0550 02/11/23 0709 02/11/23 0734 02/11/23 1155  BP:  (!) 153/63  (!) 162/84  Pulse: 91 90  92  Resp: (!) 22 (!) 22  (!) 22  Temp:  97.9 F (36.6 C)  99.4 F (37.4 C)  TempSrc:  Oral  Oral  SpO2: 96% 96% 97% 94%  Weight: 83.1 kg     Height:        Examination:  GENERAL: No apparent distress.  Nontoxic. HEENT: MMM.  Vision and hearing grossly intact.  NECK: Supple.  No apparent JVD.  RESP:  No IWOB.  Very rhonchorous and gurgly. CVS:  RRR. Heart sounds  normal.  ABD/GI/GU: BS+. Abd soft, NTND.  MSK/EXT:  Moves extremities. No apparent deformity. No edema.  SKIN: no apparent skin lesion or wound NEURO: Awake, alert and oriented appropriately.  No apparent focal neuro deficit. PSYCH: Calm. Normal affect.   Procedures:  None  Microbiology summarized: COVID-19, influenza and RSV PCR nonreactive MRSA PCR screen negative Blood cultures NGTD  Assessment and plan: Principal Problem:   Severe sepsis (HCC) Active Problems:   CAP (community acquired pneumonia)   Acute hypoxic respiratory failure (HCC)   Diabetes type 2, controlled (HCC)   Essential hypertension   Pneumonia   Chronic idiopathic thrombocytopenia (HCC)   Hypothyroidism   Elevated brain natriuretic peptide (BNP) level  Severe sepsis with acute hypoxic respiratory failure due to community-acquired pneumonia and rhinovirus infection: RVP positive for rhinovirus.  However, his procalcitonin is markedly elevated suggesting some overlapping bacterial infection.  Respiratory failure improved. -Continue ceftriaxone and Zithromax -Continue nebulizers, mucolytic's and antitussive as needed -Aspiration precaution.  Started dysphagia diet pending SLP eval. -IS/OOB/PT/OT    Mildly elevated BNP: Appears euvolemic on exam.  No history of CHF.  Received IV Lasix 20 mg x 1.  TTE without significant finding.  Essential hypertension: BP elevated today. -Resume home atenolol -Continue holding home Hyzaar -Discontinue IV fluid   Chronic idiopathic thrombocytopenia: Relatively stable -Continue monitoring  NIDDM-2 with hyperglycemia: On metformin and glimepiride at home.  A1c 6.3%. Recent Labs  Lab 02/10/23 1111  02/10/23 1659 02/10/23 2346 02/11/23 0534 02/11/23 1125  GLUCAP 153* 138* 141* 106* 140*  -Continue current insulin regimen.  CKD-3A?  Unknown baseline.  Last known creatinine 1.15 in 2020.  Improving. Recent Labs    02/09/23 2045 02/10/23 0236 02/11/23 0235  02/11/23 0236  BUN 19 15 13 12   CREATININE 1.32* 1.40* 1.30* 1.21  -Continue holding Hyzaar -Monitor off IV fluid   History of hypothyroidism -Continue levothyroxine 25 mcg daily   History of BPH without LUTS -Continue Flomax 0.4 mg daily  Hypokalemia/hypomagnesemia/hypophosphatemia -Monitor replenish as appropriate  Body mass index is 25.57 kg/m.           DVT prophylaxis:  heparin injection 5,000 Units Start: 02/10/23 0600 SCDs Start: 02/10/23 0031  Code Status: Full code Family Communication: None at bedside today. Level of care: Med-Surg Status is: Inpatient Remains inpatient appropriate because: Severe sepsis with hypoxic respiratory failure due to community-acquired pneumonia and rhinovirus infection   Final disposition: Likely home Consultants:  None  55 minutes with more than 50% spent in reviewing records, counseling patient/family and coordinating care.   Sch Meds:  Scheduled Meds:  aspirin EC  81 mg Oral Daily   guaiFENesin  600 mg Oral BID   heparin injection (subcutaneous)  5,000 Units Subcutaneous Q8H   levalbuterol  0.63 mg Nebulization Q6H   levothyroxine  25 mcg Oral QAC breakfast   sodium chloride flush  3 mL Intravenous Q12H   tamsulosin  0.4 mg Oral Daily   Continuous Infusions:  sodium chloride     azithromycin Stopped (02/10/23 2300)   cefTRIAXone (ROCEPHIN)  IV Stopped (02/10/23 2100)   sodium phosphate 30 mmol in dextrose 5 % 250 mL infusion 30 mmol (02/11/23 0902)   PRN Meds:.sodium chloride, acetaminophen **OR** acetaminophen, dextrose, insulin aspart, ipratropium-albuterol, ondansetron **OR** ondansetron (ZOFRAN) IV, senna-docusate, sodium chloride flush  Antimicrobials: Anti-infectives (From admission, onward)    Start     Dose/Rate Route Frequency Ordered Stop   02/11/23 0200  vancomycin (VANCOREADY) IVPB 1250 mg/250 mL  Status:  Discontinued        1,250 mg 166.7 mL/hr over 90 Minutes Intravenous Every 24 hours 02/10/23  0054 02/10/23 0713   02/10/23 2100  azithromycin (ZITHROMAX) 500 mg in sodium chloride 0.9 % 250 mL IVPB  Status:  Discontinued        500 mg 250 mL/hr over 60 Minutes Intravenous Every 24 hours 02/09/23 2353 02/10/23 0032   02/10/23 2100  cefTRIAXone (ROCEPHIN) 2 g in sodium chloride 0.9 % 100 mL IVPB  Status:  Discontinued        2 g 200 mL/hr over 30 Minutes Intravenous Every 24 hours 02/09/23 2353 02/10/23 0032   02/10/23 2100  azithromycin (ZITHROMAX) 500 mg in sodium chloride 0.9 % 250 mL IVPB        500 mg 250 mL/hr over 60 Minutes Intravenous Every 24 hours 02/10/23 0713 02/15/23 2059   02/10/23 2000  cefTRIAXone (ROCEPHIN) 1 g in sodium chloride 0.9 % 100 mL IVPB        1 g 200 mL/hr over 30 Minutes Intravenous Every 24 hours 02/10/23 0713 02/15/23 1959   02/10/23 0200  piperacillin-tazobactam (ZOSYN) IVPB 3.375 g  Status:  Discontinued        3.375 g 12.5 mL/hr over 240 Minutes Intravenous Every 8 hours 02/10/23 0054 02/10/23 0713   02/10/23 0130  vancomycin (VANCOREADY) IVPB 1500 mg/300 mL        1,500 mg 150 mL/hr over 120 Minutes Intravenous  NOW 02/10/23 0054 02/10/23 0321   02/09/23 2045  cefTRIAXone (ROCEPHIN) 2 g in sodium chloride 0.9 % 100 mL IVPB  Status:  Discontinued        2 g 200 mL/hr over 30 Minutes Intravenous Every 24 hours 02/09/23 2044 02/09/23 2353   02/09/23 2045  azithromycin (ZITHROMAX) 500 mg in sodium chloride 0.9 % 250 mL IVPB  Status:  Discontinued        500 mg 250 mL/hr over 60 Minutes Intravenous Every 24 hours 02/09/23 2044 02/09/23 2353        I have personally reviewed the following labs and images: CBC: Recent Labs  Lab 02/09/23 2045 02/09/23 2105 02/10/23 0236 02/11/23 0235  WBC 8.3  --  4.6 5.6  NEUTROABS 6.3  --   --   --   HGB 15.0 16.0 12.4* 12.1*  HCT 44.6 47.0 37.2* 36.2*  MCV 84.5  --  84.0 86.0  PLT 122*  --  93* 90*   BMP &GFR Recent Labs  Lab 02/09/23 2045 02/09/23 2105 02/10/23 0236 02/11/23 0235 02/11/23 0236   NA 133* 133* 132* 134* 134*  K 3.5 3.5 3.4* 4.0 4.0  CL 100  --  100 102 102  CO2 20*  --  22 23 23   GLUCOSE 200*  --  241* 121* 120*  BUN 19  --  15 13 12   CREATININE 1.32*  --  1.40* 1.30* 1.21  CALCIUM 9.1  --  8.1* 7.9* 7.9*  MG  --   --  1.3* 1.8  --   PHOS  --   --   --   --  <1.0*   Estimated Creatinine Clearance: 52.7 mL/min (by C-G formula based on SCr of 1.21 mg/dL). Liver & Pancreas: Recent Labs  Lab 02/09/23 2045 02/10/23 0236 02/11/23 0235 02/11/23 0236  AST 29 27 38  --   ALT 18 16 23   --   ALKPHOS 60 40 38  --   BILITOT 1.3* 1.0 0.6  --   PROT 7.7 5.8* 5.6*  --   ALBUMIN 4.2 2.9* 2.7* 2.6*   No results for input(s): "LIPASE", "AMYLASE" in the last 168 hours. No results for input(s): "AMMONIA" in the last 168 hours. Diabetic: Recent Labs    02/10/23 1455  HGBA1C 6.3*   Recent Labs  Lab 02/10/23 1111 02/10/23 1659 02/10/23 2346 02/11/23 0534 02/11/23 1125  GLUCAP 153* 138* 141* 106* 140*   Cardiac Enzymes: No results for input(s): "CKTOTAL", "CKMB", "CKMBINDEX", "TROPONINI" in the last 168 hours. No results for input(s): "PROBNP" in the last 8760 hours. Coagulation Profile: Recent Labs  Lab 02/09/23 2045  INR 1.2   Thyroid Function Tests: No results for input(s): "TSH", "T4TOTAL", "FREET4", "T3FREE", "THYROIDAB" in the last 72 hours. Lipid Profile: No results for input(s): "CHOL", "HDL", "LDLCALC", "TRIG", "CHOLHDL", "LDLDIRECT" in the last 72 hours. Anemia Panel: No results for input(s): "VITAMINB12", "FOLATE", "FERRITIN", "TIBC", "IRON", "RETICCTPCT" in the last 72 hours. Urine analysis:    Component Value Date/Time   COLORURINE YELLOW 02/10/2023 0051   APPEARANCEUR CLEAR 02/10/2023 0051   LABSPEC 1.017 02/10/2023 0051   PHURINE 5.0 02/10/2023 0051   GLUCOSEU 50 (A) 02/10/2023 0051   HGBUR MODERATE (A) 02/10/2023 0051   BILIRUBINUR NEGATIVE 02/10/2023 0051   KETONESUR 5 (A) 02/10/2023 0051   PROTEINUR NEGATIVE 02/10/2023 0051    NITRITE NEGATIVE 02/10/2023 0051   LEUKOCYTESUR NEGATIVE 02/10/2023 0051   Sepsis Labs: Invalid input(s): "PROCALCITONIN", "LACTICIDVEN"  Microbiology: Recent Results (from the  past 240 hour(s))  Resp panel by RT-PCR (RSV, Flu A&B, Covid)     Status: None   Collection Time: 02/09/23  8:45 PM   Specimen: Nasal Swab  Result Value Ref Range Status   SARS Coronavirus 2 by RT PCR NEGATIVE NEGATIVE Final    Comment: (NOTE) SARS-CoV-2 target nucleic acids are NOT DETECTED.  The SARS-CoV-2 RNA is generally detectable in upper respiratory specimens during the acute phase of infection. The lowest concentration of SARS-CoV-2 viral copies this assay can detect is 138 copies/mL. A negative result does not preclude SARS-Cov-2 infection and should not be used as the sole basis for treatment or other patient management decisions. A negative result may occur with  improper specimen collection/handling, submission of specimen other than nasopharyngeal swab, presence of viral mutation(s) within the areas targeted by this assay, and inadequate number of viral copies(<138 copies/mL). A negative result must be combined with clinical observations, patient history, and epidemiological information. The expected result is Negative.  Fact Sheet for Patients:  BloggerCourse.com  Fact Sheet for Healthcare Providers:  SeriousBroker.it  This test is no t yet approved or cleared by the [place] FDA and  has been authorized for detection and/or diagnosis of SARS-CoV-2 by FDA under an Emergency Use Authorization (EUA). This EUA will remain  in effect (meaning this test can be used) for the duration of the COVID-19 declaration under Section 564(b)(1) of the Act, 21 U.S.C.section 360bbb-3(b)(1), unless the authorization is terminated  or revoked sooner.       Influenza A by PCR NEGATIVE NEGATIVE Final   Influenza B by PCR NEGATIVE NEGATIVE Final     Comment: (NOTE) The Xpert Xpress SARS-CoV-2/FLU/RSV plus assay is intended as an aid in the diagnosis of influenza from Nasopharyngeal swab specimens and should not be used as a sole basis for treatment. Nasal washings and aspirates are unacceptable for Xpert Xpress SARS-CoV-2/FLU/RSV testing.  Fact Sheet for Patients: BloggerCourse.com  Fact Sheet for Healthcare Providers: SeriousBroker.it  This test is not yet approved or cleared by the [place] FDA and has been authorized for detection and/or diagnosis of SARS-CoV-2 by FDA under an Emergency Use Authorization (EUA). This EUA will remain in effect (meaning this test can be used) for the duration of the COVID-19 declaration under Section 564(b)(1) of the Act, 21 U.S.C. section 360bbb-3(b)(1), unless the authorization is terminated or revoked.     Resp Syncytial Virus by PCR NEGATIVE NEGATIVE Final    Comment: (NOTE) Fact Sheet for Patients: BloggerCourse.com  Fact Sheet for Healthcare Providers: SeriousBroker.it  This test is not yet approved or cleared by the [place] FDA and has been authorized for detection and/or diagnosis of SARS-CoV-2 by FDA under an Emergency Use Authorization (EUA). This EUA will remain in effect (meaning this test can be used) for the duration of the COVID-19 declaration under Section 564(b)(1) of the Act, 21 U.S.C. section 360bbb-3(b)(1), unless the authorization is terminated or revoked.  Performed at [facility], [address] [place], [place] [address]   Blood Culture (routine x 2)     Status: None (Preliminary result)   Collection Time: 02/09/23  8:45 PM   Specimen: BLOOD  Result Value Ref Range Status   Specimen Description   Final    BLOOD LEFT ANTECUBITAL Performed at [facility], [address] [place], [place] [address]    Special Requests   Final     BOTTLES DRAWN AEROBIC AND ANAEROBIC Blood Culture adequate volume  Performed at [facility], [address] [place], [place] [address]    Culture   Final    NO GROWTH 2 DAYS Performed at [facility], [address], [place], [place] [address]    Report Status PENDING  Incomplete  Blood Culture (routine x 2)     Status: None (Preliminary result)   Collection Time: 02/09/23  8:50 PM   Specimen: BLOOD RIGHT FOREARM  Result Value Ref Range Status   Specimen Description   Final    BLOOD RIGHT FOREARM Performed at [facility], [address] [place], [place] [address]    Special Requests   Final    BOTTLES DRAWN AEROBIC AND ANAEROBIC Blood Culture results may not be optimal due to an excessive volume of blood received in culture bottles Performed at [facility], [address] [place], [place] [address]    Culture   Final    NO GROWTH 2 DAYS Performed at [facility], [address], [place], [place] [address]    Report Status PENDING  Incomplete  Expectorated Sputum Assessment w Gram Stain, Rflx to Resp Cult     Status: None   Collection Time: 02/10/23 12:34 AM   Specimen: Sputum  Result Value Ref Range Status   Specimen Description SPUTUM  Final   Special Requests Normal  Final   Sputum evaluation   Final    Sputum specimen not acceptable for testing.  Please recollect.   notified RN EMILY HUDSON ON 02/10/23 @ 1551 BY DRT Performed at [facility], [address], [place], [place] [address]    Report Status 02/10/2023 FINAL  Final  Culture, blood (Routine X 2) w Reflex to ID Panel     Status: None (Preliminary result)   Collection Time: 02/10/23 12:59 AM   Specimen: BLOOD RIGHT HAND  Result Value Ref Range Status   Specimen Description BLOOD RIGHT HAND  Final   Special Requests   Final    BOTTLES DRAWN AEROBIC AND ANAEROBIC Blood Culture adequate volume   Culture   Final    NO GROWTH 1 DAY Performed at [facility], [address], [place], [place] [address]    Report Status PENDING  Incomplete  Culture, blood (Routine X 2) w Reflex to ID Panel     Status: None (Preliminary result)   Collection Time: 02/10/23  1:00 AM   Specimen: BLOOD RIGHT HAND  Result Value Ref Range Status   Specimen Description BLOOD RIGHT HAND  Final   Special Requests   Final    BOTTLES DRAWN AEROBIC AND ANAEROBIC Blood Culture adequate volume   Culture   Final    NO GROWTH 1 DAY Performed at [facility], [address] [place], [place] [address]    Report Status PENDING  Incomplete  MRSA Next Gen by PCR, Nasal     Status: None   Collection Time: 02/10/23  1:25 AM  Result Value Ref Range Status   MRSA by PCR Next Gen NOT DETECTED NOT DETECTED Final    Comment: (NOTE) The GeneXpert MRSA Assay (FDA approved for NASAL specimens only), is one component of a comprehensive MRSA colonization surveillance program. It is not intended to diagnose MRSA infection nor to guide or monitor treatment for MRSA infections. Test performance is not FDA approved in patients less than 68 years old. Performed at [facility], [address], [place],  [place] [address]   Respiratory (~20 pathogens) panel by PCR     Status: Abnormal   Collection Time: 02/10/23  9:20 AM   Specimen: Nasopharyngeal Swab; Respiratory  Result Value Ref Range Status   Adenovirus NOT DETECTED NOT DETECTED Final   Coronavirus 229E NOT DETECTED NOT DETECTED Final    Comment: (NOTE) The Coronavirus on the Respiratory Panel, DOES NOT test for the novel  Coronavirus (2019 nCoV)    Coronavirus HKU1 NOT DETECTED NOT DETECTED Final   Coronavirus NL63 NOT DETECTED NOT DETECTED Final   Coronavirus OC43 NOT DETECTED NOT DETECTED Final   Metapneumovirus NOT DETECTED NOT DETECTED Final   Rhinovirus / Enterovirus DETECTED (A) NOT DETECTED Final   Influenza A NOT DETECTED NOT DETECTED Final   Influenza B NOT DETECTED NOT DETECTED Final    Parainfluenza Virus 1 NOT DETECTED NOT DETECTED Final   Parainfluenza Virus 2 NOT DETECTED NOT DETECTED Final   Parainfluenza Virus 3 NOT DETECTED NOT DETECTED Final   Parainfluenza Virus 4 NOT DETECTED NOT DETECTED Final   Respiratory Syncytial Virus NOT DETECTED NOT DETECTED Final   Bordetella pertussis NOT DETECTED NOT DETECTED Final   Bordetella Parapertussis NOT DETECTED NOT DETECTED Final   Chlamydophila pneumoniae NOT DETECTED NOT DETECTED Final   Mycoplasma pneumoniae NOT DETECTED NOT DETECTED Final    Comment: Performed at [facility], [address], [place], [place] [address]  Expectorated Sputum Assessment w Gram Stain, Rflx to Resp Cult     Status: None   Collection Time: 02/11/23  5:48 AM   Specimen: Expectorated Sputum  Result Value Ref Range Status   Specimen Description EXPECTORATED SPUTUM  Final   Special Requests NONE  Final   Sputum evaluation   Final    THIS SPECIMEN IS ACCEPTABLE FOR SPUTUM CULTURE Performed at [facility], [address], [place], [place] [address]    Report Status 02/11/2023 FINAL  Final  Culture, Respiratory w Gram Stain     Status: None (Preliminary result)   Collection Time: 02/11/23  5:48 AM  Result Value Ref Range Status   Specimen Description EXPECTORATED SPUTUM  Final   Special Requests NONE Reflexed from [id number]  Final   Gram Stain   Final    FEW SQUAMOUS EPITHELIAL CELLS PRESENT MODERATE WBC PRESENT,BOTH PMN AND MONONUCLEAR FEW GRAM POSITIVE RODS Performed at [facility], [address] [place], [place] [address]    Culture PENDING  Incomplete   Report Status PENDING  Incomplete    Radiology Studies: No results found.    Mehran Guderian T. Jaliana Medellin [facility]  If 7PM-7AM, please contact night-coverage [email or web address] 02/11/2023, 12:30 PM

## 2023-02-11 NOTE — Evaluation (Signed)
Occupational Therapy Evaluation Patient Details Name: Blake Herman MRN: [id number] DOB: Oct 22, 1945 Today's Date: 02/11/2023   History of Present Illness Pt presenting to [facility] on 9/12 with shortness of breath and hypoxia. CXR with patchy airspace; transferred to [facility] for further management/work up. Admitted for severe sepsis with acute respiratory failure in the setting of L lower lobe PNA. PMH significant for DM, HTN, thrombocytopenia, gout, arthritis.   Clinical Impression   PTA, pt lived with his wife and was independent in ADL, IADL, working part time and driving. Upon eval, pt with decreased cardiopulmonary status and activity tolerance. Pt with good insight and choosing to use RW for support. Pt needing up to 2L O2 to maintain SpO2>90 during functional mobility into hall. Pt performing OOB ADL with CGA approaching supervision. Will follow acutely to educate regarding energy conservation and optimize activity tolerance but do not suspect need for follow up OT after discharge.        If plan is discharge home, recommend the following: A little help with walking and/or transfers;A little help with bathing/dressing/bathroom;Assistance with cooking/housework;Help with stairs or ramp for entrance    Functional Status Assessment  Patient has had a recent decline in their functional status and demonstrates the ability to make significant improvements in function in a reasonable and predictable amount of time.  Equipment Recommendations  None recommended by OT    Recommendations for Other Services       Precautions / Restrictions Precautions Precautions: Other (comment) Precaution Comments: watch O2      Mobility Bed Mobility               General bed mobility comments: in chair on arrival and departure    Transfers Overall transfer level: Needs assistance Equipment used: Rolling walker (2 wheels) Transfers: Sit to/from Stand Sit to Stand: Supervision           General  transfer comment: for safety      Balance Overall balance assessment: Mild deficits observed, not formally tested                                         ADL either performed or assessed with clinical judgement   ADL Overall ADL's : Needs assistance/impaired Eating/Feeding: Independent   Grooming: Supervision/safety;Standing   Upper Body Bathing: Modified independent   Lower Body Bathing: Supervison/ safety;Sit to/from stand   Upper Body Dressing : Modified independent   Lower Body Dressing: Supervision/safety;Sit to/from stand   Toilet Transfer: Supervision/safety;Ambulation;Rolling walker (2 wheels)   Toileting- Clothing Manipulation and Hygiene: Modified independent;Sitting/lateral lean       Functional mobility during ADLs: Contact guard assist;Rolling walker (2 wheels) General ADL Comments: CGA for longer distances and min cues for self pacing     Vision Ability to See in Adequate Light: 0 Adequate Patient Visual Report: No change from baseline Vision Assessment?: No apparent visual deficits     Perception Perception: Within Functional Limits       Praxis Praxis: WFL       Pertinent Vitals/Pain Pain Assessment Pain Assessment: No/denies pain     Extremity/Trunk Assessment Upper Extremity Assessment Upper Extremity Assessment: Overall WFL for tasks assessed   Lower Extremity Assessment Lower Extremity Assessment: Defer to PT evaluation       Communication Communication Communication: No apparent difficulties   Cognition Arousal: Alert Behavior During Therapy: WFL for tasks assessed/performed Overall Cognitive Status: Within  Functional Limits for tasks assessed                                 General Comments: Slightly impulsive but believe this may be pt's personal baseline as he describes himself as a very active person     General Comments  SpO2 as low as 88 during functional mobility so donned 2LO2 and pt  satting at 90. RN aware    Exercises     Shoulder Instructions      Home Living Family/patient expects to be discharged to:: Private residence Living Arrangements: Spouse/significant other Available Help at Discharge: Family Type of Home: House Home Access: Stairs to enter Secretary/administrator of Steps: 3 Entrance Stairs-Rails: Can reach both Home Layout: One level     Bathroom Shower/Tub: Chief Strategy Officer: Standard     Home Equipment: Agricultural consultant (2 wheels);Cane - single point;BSC/3in1          Prior Functioning/Environment Prior Level of Function : Independent/Modified Independent;Working/employed;Driving             Mobility Comments: uses cane during sciatica flares, but otherwise no device ADLs Comments: 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 [employer]        OT Problem List: Cardiopulmonary status limiting activity;Impaired balance (sitting and/or standing);Decreased activity tolerance      OT Treatment/Interventions: Self-care/ADL training;Therapeutic exercise;DME and/or AE instruction;Balance training;Patient/family education;Therapeutic activities    OT Goals(Current goals can be found in the care plan section) Acute Rehab OT Goals Patient Stated Goal: go home OT Goal Formulation: With patient Time For Goal Achievement: 02/25/23 Potential to Achieve Goals: Good ADL Goals Pt Will Perform Grooming: with modified independence;standing Pt Will Perform Lower Body Dressing: with modified independence;sitting/lateral leans;sit to/from stand Pt Will Transfer to Toilet: with modified independence;ambulating Additional ADL Goal #1: Pt will identify and implement 2+ energy conservation strategies for use on the home setting.  OT Frequency: Min 1X/week    Co-evaluation              AM-PAC OT "6 Clicks" Daily Activity     Outcome Measure Help from another person eating meals?: None Help from  another person taking care of personal grooming?: A Little Help from another person toileting, which includes using toliet, bedpan, or urinal?: A Little Help from another person bathing (including washing, rinsing, drying)?: A Little Help from another person to put on and taking off regular upper body clothing?: None Help from another person to put on and taking off regular lower body clothing?: A Little 6 Click Score: 20   End of Session Equipment Utilized During Treatment: Gait belt;Rolling walker (2 wheels) Nurse Communication: Mobility status  Activity Tolerance: Patient tolerated treatment well Patient left: with call bell/phone within reach;in chair;with family/visitor present  OT Visit Diagnosis: Unsteadiness on feet (R26.81)                Time: 0981-1914 OT Time Calculation (min): 39 min Charges:  OT General Charges $OT Visit: 1 Visit OT Evaluation $OT Eval Moderate Complexity: 1 Mod OT Treatments $Self Care/Home Management : 23-37 mins  Tyler Deis, OTR/L [facility] Office: [phone]   Myrla Halsted 02/11/2023, 2:35 PM

## 2023-02-11 NOTE — Evaluation (Signed)
Physical Therapy Evaluation Patient Details Name: Blake Herman MRN: [id number] DOB: [birth year] Today's Date: 02/11/2023  History of Present Illness  Pt presenting to [facility] on 9/12 with shortness of breath and hypoxia. CXR with patchy airspace; transferred to [facility] for further management/work up. Admitted for severe sepsis with acute respiratory failure in the setting of L lower lobe PNA. PMH significant for DM, HTN, thrombocytopenia, gout, arthritis.  Clinical Impression  Pt admitted with/for PNA.  Pt is not quite at baseline function, needing CGA for safety using the cane.  Pt currently limited functionally due to the problems listed below.  (see problems list.)  Pt will benefit from PT to maximize function and safety to be able to get home safely with available assist.         If plan is discharge home, recommend the following: Assistance with cooking/housework;A little help with walking and/or transfers   Can travel by private vehicle        Equipment Recommendations None recommended by PT  Recommendations for Other Services       Functional Status Assessment Patient has had a recent decline in their functional status and demonstrates the ability to make significant improvements in function in a reasonable and predictable amount of time.     Precautions / Restrictions Precautions Precautions: Other (comment) Precaution Comments: watch O2      Mobility  Bed Mobility Overal bed mobility: Needs Assistance Bed Mobility: Supine to Sit, Sit to Supine     Supine to sit: Supervision Sit to supine: Supervision   General bed mobility comments: flat bed, no assist.    Transfers Overall transfer level: Needs assistance   Transfers: Sit to/from Stand Sit to Stand: Supervision           General transfer comment: for safety    Ambulation/Gait Ambulation/Gait assistance: Contact guard assist Gait Distance (Feet): 230 Feet Assistive device: Straight cane (or none) Gait  Pattern/deviations: Step-through pattern Gait velocity: slower Gait velocity interpretation: <1.8 ft/sec, indicate of risk for recurrent falls   General Gait Details: episodes of unsteadiness with mild stagger or drift to recover minor LOB with scanning or abrupt directional changes  Appropriate use of the SPC to manage the unsteadiness  Stairs            Wheelchair Mobility     Tilt Bed    Modified Rankin (Stroke Patients Only)       Balance Overall balance assessment: Needs assistance Sitting-balance support: Feet supported Sitting balance-Leahy Scale: Fair     Standing balance support: No upper extremity supported, During functional activity Standing balance-Leahy Scale: Fair                               Pertinent Vitals/Pain Pain Assessment Pain Assessment: Faces Faces Pain Scale: No hurt    Home Living Family/patient expects to be discharged to:: Private residence Living Arrangements: Spouse/significant other Available Help at Discharge: Family (available most of the day in shifts) Type of Home: House Home Access: Stairs to enter Entrance Stairs-Rails: Can reach both Entrance Stairs-Number of Steps: 3   Home Layout: One level Home Equipment: Agricultural consultant (2 wheels);Cane - single point;BSC/3in1      Prior Function Prior Level of Function : Independent/Modified Independent;Working/employed;Driving             Mobility Comments: uses cane during sciatica flares, but otherwise no device ADLs Comments: 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[employer] [facility]     Extremity/Trunk Assessment   Upper Extremity Assessment Upper Extremity Assessment: Generalized weakness    Lower Extremity Assessment Lower Extremity Assessment: Generalized weakness       Communication   Communication Communication: No apparent difficulties  Cognition Arousal: Alert Behavior During Therapy: WFL for tasks  assessed/performed Overall Cognitive Status: Within Functional Limits for tasks assessed                                 General Comments: Slightly impulsive but believe this may be pt's personal baseline as he describes himself as a very active person        General Comments General comments (skin integrity, edema, etc.): SpO2 on RA during gait/mobility maintained between 88-91%,  replaced O2 and sats jumped into the low 90's    Exercises     Assessment/Plan    PT Assessment Patient needs continued PT services  PT Problem List Decreased strength;Decreased activity tolerance;Decreased mobility       PT Treatment Interventions Gait training;Stair training;Functional mobility training;Therapeutic activities;Balance training;Patient/family education;DME instruction    PT Goals (Current goals can be found in the Care Plan section)  Acute Rehab PT Goals Patient Stated Goal: home and back to my PLOF PT Goal Formulation: With patient Time For Goal Achievement: 02/18/23 Potential to Achieve Goals: Good    Frequency Min 1X/week     Co-evaluation               AM-PAC PT "6 Clicks" Mobility  Outcome Measure Help needed turning from your back to your side while in a flat bed without using bedrails?: A Little Help needed moving from lying on your back to sitting on the side of a flat bed without using bedrails?: A Little Help needed moving to and from a bed to a chair (including a wheelchair)?: A Little Help needed standing up from a chair using your arms (e.g., wheelchair or bedside chair)?: A Little Help needed to walk in hospital room?: A Little Help needed climbing 3-5 steps with a railing? : A Little 6 Click Score: 18    End of Session   Activity Tolerance: Patient tolerated treatment well;Patient limited by fatigue   Nurse Communication: Mobility status PT Visit Diagnosis: Muscle weakness (generalized) (M62.81);Difficulty in walking, not elsewhere  classified (R26.2)    Time: 8295-6213 PT Time Calculation (min) (ACUTE ONLY): 18 min   Charges:   PT Evaluation $PT Eval Low Complexity: 1 Low PT Treatments $ PT Supplies: 1 Supply           02/11/2023  Jacinto Halim., PT Acute Rehabilitation Services [phone]  (office)  Blake Herman 02/11/2023, 5:41 PM

## 2023-02-12 DIAGNOSIS — I10 Essential (primary) hypertension: Secondary | ICD-10-CM | POA: Diagnosis not present

## 2023-02-12 DIAGNOSIS — J189 Pneumonia, unspecified organism: Secondary | ICD-10-CM | POA: Diagnosis not present

## 2023-02-12 DIAGNOSIS — A419 Sepsis, unspecified organism: Secondary | ICD-10-CM | POA: Diagnosis not present

## 2023-02-12 DIAGNOSIS — J9601 Acute respiratory failure with hypoxia: Secondary | ICD-10-CM | POA: Diagnosis not present

## 2023-02-12 LAB — COMPREHENSIVE METABOLIC PANEL
ALT: 21 U/L (ref 0–44)
AST: 35 U/L (ref 15–41)
Albumin: 2.4 g/dL — ABNORMAL LOW (ref 3.5–5.0)
Alkaline Phosphatase: 47 U/L (ref 38–126)
Anion gap: 8 (ref 5–15)
BUN: 11 mg/dL (ref 8–23)
CO2: 22 mmol/L (ref 22–32)
Calcium: 8 mg/dL — ABNORMAL LOW (ref 8.9–10.3)
Chloride: 103 mmol/L (ref 98–111)
Creatinine, Ser: 1.1 mg/dL (ref 0.61–1.24)
GFR, Estimated: >60 mL/min (ref >60)
Glucose, Bld: 121 mg/dL — ABNORMAL HIGH (ref 70–99)
Potassium: 3.8 mmol/L (ref 3.5–5.1)
Sodium: 133 mmol/L — ABNORMAL LOW (ref 135–145)
Total Bilirubin: 0.8 mg/dL (ref 0.3–1.2)
Total Protein: 5.3 g/dL — ABNORMAL LOW (ref 6.5–8.1)

## 2023-02-12 LAB — CBC
HCT: 35 % — ABNORMAL LOW (ref 39.0–52.0)
Hemoglobin: 11.6 g/dL — ABNORMAL LOW (ref 13.0–17.0)
MCH: 28 pg (ref 26.0–34.0)
MCHC: 33.1 g/dL (ref 30.0–36.0)
MCV: 84.3 fL (ref 80.0–100.0)
Platelets: 102 10*3/uL — ABNORMAL LOW (ref 150–400)
RBC: 4.15 MIL/uL — ABNORMAL LOW (ref 4.22–5.81)
RDW: 14.6 % (ref 11.5–15.5)
WBC: 6 10*3/uL (ref 4.0–10.5)
nRBC: 0 % (ref 0.0–0.2)

## 2023-02-12 LAB — MAGNESIUM: Magnesium: 1.9 mg/dL (ref 1.7–2.4)

## 2023-02-12 LAB — GLUCOSE, CAPILLARY: Glucose-Capillary: 111 mg/dL — ABNORMAL HIGH (ref 70–99)

## 2023-02-12 LAB — PHOSPHORUS: Phosphorus: 1.6 mg/dL — ABNORMAL LOW (ref 2.5–4.6)

## 2023-02-12 LAB — PROCALCITONIN: Procalcitonin: 6.58 ng/mL

## 2023-02-12 MED ORDER — K PHOS MONO-SOD PHOS DI & MONO 155-852-130 MG PO TABS
500.0000 mg | ORAL_TABLET | Freq: Four times a day (QID) | ORAL | Status: DC
  Administered 2023-02-12: 500 mg via ORAL
  Filled 2023-02-12 (×2): qty 2

## 2023-02-12 MED ORDER — AZITHROMYCIN 500 MG PO TABS: 500.0000 mg | ORAL_TABLET | Freq: Every evening | ORAL | 0 refills | Status: AC

## 2023-02-12 MED ORDER — AMOXICILLIN-POT CLAVULANATE 875-125 MG PO TABS: 1.0000 | ORAL_TABLET | Freq: Two times a day (BID) | ORAL | 0 refills | Status: AC

## 2023-02-12 MED ORDER — K PHOS MONO-SOD PHOS DI & MONO 155-852-130 MG PO TABS: 500.0000 mg | ORAL_TABLET | Freq: Four times a day (QID) | ORAL | 0 refills | Status: AC

## 2023-02-12 MED ORDER — POTASSIUM PHOSPHATES 15 MMOLE/5ML IV SOLN
30.0000 mmol | Freq: Once | INTRAVENOUS | Status: DC
  Filled 2023-02-12: qty 10

## 2023-02-12 NOTE — TOC Transition Note (Signed)
Transition of Care [facility]) - CM/SW Discharge Note   Patient Details  Name: Blake Herman MRN: [id number] Date of Birth: 1946-02-17  [facility] CM/SW Contact:  Ronny Bacon, RN Phone Number: 02/12/2023, 8:40 AM   Clinical Narrative:   Patient is being discharged today. Spoke with patient and family by phone to discuss [facility] PT recommendations. Patient declined [facility] PT referral at this time.    Final next level of care: Home/Self Care Barriers to Discharge: No Barriers Identified   Patient Goals and CMS Choice      Discharge Placement                         Discharge Plan and Services Additional resources added to the After Visit Summary for                            [facility] Arranged: Patient Refused HH          Social Determinants of Health (SDOH) Interventions SDOH Screenings   Food Insecurity: No Food Insecurity (02/10/2023)  Housing: Low Risk  (02/10/2023)  Transportation Needs: No Transportation Needs (02/10/2023)  Utilities: Not At Risk (02/10/2023)  Tobacco Use: Low Risk  (02/10/2023)     Readmission Risk Interventions     No data to display

## 2023-02-12 NOTE — Discharge Summary (Signed)
Physician Discharge Summary  Blake Herman UUV:[id number] DOB: [birth year] DOA: 02/09/2023  PCP: Jackie Plum, MD  Admit date: 02/09/2023 Discharge date: 02/12/2023 Admitted From: Home Disposition: Home Recommendations for Outpatient Follow-up:  Follow up with PCP in 1 week Check CMP and CBC at follow-up Please follow up on the following pending results: None  Home Health: HH PT Equipment/Devices: None  Discharge Condition: Stable CODE STATUS: Full code  Follow-up Information     Osei-Bonsu, Greggory Stallion, MD. Schedule an appointment as soon as possible for a visit in 1 week(s).   Specialty: Internal Medicine Contact information: [address] [place] [address] [phone]                 Hospital course 77 year old M with PMH of thrombocytopenia, HTN, DM-2, hypothyroidism and BPH presenting with sore throat, fever, cough, DOE and not feeling well for 2 to 3 days, and admitted for severe sepsis with acute hypoxic respiratory failure in the setting of left lower lobe pneumonia.  He required up to 15 L by HFNC.  BNP 177.  ABG without significant finding.  COVID-19, influenza and RSV PCR nonreactive.  Pro-Cal elevated.  CXR with airspace disease in LLL.  Cultures obtained.  Echocardiogram ordered.  Started on broad-spectrum antibiotics, nebulizers and IV fluid.    The next day, oxygen requirement and symptoms improved.  MRSA PCR and blood cultures negative.  Antibiotic de-escalated to ceftriaxone and Zithromax.  RVP positive for rhinovirus.  Procalcitonin elevated.   Patient's respiratory failure resolved.  Felt well and ready to go home.  He is discharged on p.o. Augmentin and Zithromax for 2 more days to complete a total of 5 days course.  See individual problem list below for more.   Problems addressed during this hospitalization Principal Problem:   Severe sepsis (HCC) Active Problems:   CAP (community acquired pneumonia)   Acute hypoxic  respiratory failure (HCC)   Diabetes type 2, controlled (HCC)   Essential hypertension   Pneumonia   Chronic idiopathic thrombocytopenia (HCC)   Hypothyroidism   Elevated brain natriuretic peptide (BNP) level   Severe sepsis with acute hypoxic respiratory failure due to community-acquired pneumonia and rhinovirus infection: RVP positive for rhinovirus.  However, his procalcitonin is markedly elevated suggesting some overlapping bacterial infection.  Sepsis physiology and respiratory failure resolved. -Discharge on p.o. Augmentin and Zithromax for 2 more days to complete 5 days course.     Mildly elevated BNP: Appears euvolemic on exam.  No history of CHF.  Received IV Lasix 20 mg x 1.  TTE without significant finding.   Essential hypertension: Resume home meds.   Chronic idiopathic thrombocytopenia: Relatively stable   NIDDM-2 with hyperglycemia: On metformin and glimepiride at home.  A1c 6.3%. -Continue home meds.   CKD-3A?  Unknown baseline.  Creatinine improved from 1.3-1.1. -Recheck CMP at follow-up  History of hypothyroidism -Continue levothyroxine 25 mcg daily   History of BPH without LUTS -Continue Flomax 0.4 mg daily   Hypokalemia/hypomagnesemia/hypophosphatemia -Discharged on K-Phos            Time spent 35 minutes  Vital signs Vitals:   02/12/23 0610 02/12/23 0819 02/12/23 0852 02/12/23 0853  BP:    (!) 129/57  Pulse: 77  (!) 56 (!) 58  Temp:   98.7 F (37.1 C)   Resp:      Height:      Weight: 82.6 kg     SpO2: 91% 100% 95%   TempSrc:   Oral  BMI (Calculated): 25.41        Discharge exam  GENERAL: No apparent distress.  Nontoxic. HEENT: MMM.  Vision and hearing grossly intact.  NECK: Supple.  No apparent JVD.  RESP:  No IWOB.  Fair aeration bilaterally. CVS:  RRR. Heart sounds normal.  ABD/GI/GU: BS+. Abd soft, NTND.  MSK/EXT:  Moves extremities. No apparent deformity. No edema.  SKIN: no apparent skin lesion or wound NEURO: Awake and  alert. Oriented appropriately.  No apparent focal neuro deficit. PSYCH: Calm. Normal affect.   Discharge Instructions Discharge Instructions     Diet - low sodium heart healthy   Complete by: As directed    Diet Carb Modified   Complete by: As directed    Discharge instructions   Complete by: As directed    It has been a pleasure taking care of you!  You were hospitalized due to bacterial pneumonia and viral infection (cold virus) for which you have been treated.  We are discharging you more antibiotics for bacterial pneumonia to complete treatment course.  Please review your new medication list and the directions on your medications before you take them.  Follow-up with your primary care doctor in 1 week or sooner if needed   Take care,   Increase activity slowly   Complete by: As directed       Allergies as of 02/12/2023   No Known Allergies      Medication List     TAKE these medications    amoxicillin-clavulanate 875-125 MG tablet Commonly known as: AUGMENTIN Take 1 tablet by mouth 2 (two) times daily for 2 days.   aspirin EC 81 MG tablet Take 81 mg by mouth daily.   atenolol 50 MG tablet Commonly known as: TENORMIN Take 50 mg by mouth daily.   azithromycin 500 MG tablet Commonly known as: ZITHROMAX Take 1 tablet (500 mg total) by mouth every evening for 2 days.   cholecalciferol 1000 units tablet Commonly known as: VITAMIN D Take 1,000 Units by mouth daily.   glimepiride 2 MG tablet Commonly known as: AMARYL Take 2 mg by mouth daily with breakfast.   levothyroxine 25 MCG tablet Commonly known as: SYNTHROID Take 25 mcg by mouth daily before breakfast.   losartan-hydrochlorothiazide 50-12.5 MG tablet Commonly known as: HYZAAR Take 1 tablet by mouth daily.   metFORMIN 500 MG tablet Commonly known as: GLUCOPHAGE Take 500 mg by mouth 2 (two) times daily.   phosphorus 155-852-130 MG tablet Commonly known as: K PHOS NEUTRAL Take 2 tablets (500 mg  total) by mouth 4 (four) times daily.   tamsulosin 0.4 MG Caps capsule Commonly known as: FLOMAX Take 0.4 mg by mouth daily.        Consultations: None  Procedures/Studies:   ECHOCARDIOGRAM COMPLETE  Result Date: 02/10/2023    ECHOCARDIOGRAM REPORT   Patient Name:   Blake Herman Date of Exam: 02/10/2023 Medical Rec #:  [id number]     Height:       71.0 in Accession #:    [id number]    Weight:       180.1 lb Date of Birth:  12-22-1945     BSA:          2.017 m Patient Age:    77 years      BP:           112/66 mmHg Patient Gender: M             HR:  81 bpm. Exam Location:  Inpatient Procedure: 2D Echo, Cardiac Doppler and Color Doppler Indications:    Dyspnea R06.00  History:        Patient has no prior history of Echocardiogram examinations.                 Risk Factors:Hypertension and Diabetes.  Sonographer:    Lucendia Herrlich Referring Phys: [id number] SUBRINA SUNDIL IMPRESSIONS  1. Left ventricular ejection fraction, by estimation, is 50 to 55%. The left ventricle has low normal function. The left ventricle has no regional wall motion abnormalities. Left ventricular diastolic parameters were grossly normal. There is abnormal septal motion.  2. Right ventricular systolic function is normal. The right ventricular size is normal. There is normal pulmonary artery systolic pressure. The estimated right ventricular systolic pressure is 25.7 mmHg.  3. The mitral valve is normal in structure. Trivial mitral valve regurgitation. No evidence of mitral stenosis.  4. The aortic valve is tricuspid. Aortic valve regurgitation is trivial. No aortic stenosis is present.  5. The inferior vena cava is normal in size with greater than 50% respiratory variability, suggesting right atrial pressure of 3 mmHg. FINDINGS  Left Ventricle: Prominent apical trabeculations. Left ventricular ejection fraction, by estimation, is 50 to 55%. The left ventricle has low normal function. The left ventricle has no regional  wall motion abnormalities. The left ventricular internal cavity size was normal in size. There is no left ventricular hypertrophy. Abnormal septal motion. Left ventricular diastolic parameters were normal. Right Ventricle: The right ventricular size is normal. No increase in right ventricular wall thickness. Right ventricular systolic function is normal. There is normal pulmonary artery systolic pressure. The tricuspid regurgitant velocity is 2.38 m/s, and  with an assumed right atrial pressure of 3 mmHg, the estimated right ventricular systolic pressure is 25.7 mmHg. Left Atrium: Left atrial size was normal in size. Right Atrium: Right atrial size was normal in size. Pericardium: There is no evidence of pericardial effusion. Mitral Valve: The mitral valve is normal in structure. Trivial mitral valve regurgitation. No evidence of mitral valve stenosis. Tricuspid Valve: The tricuspid valve is normal in structure. Tricuspid valve regurgitation is trivial. No evidence of tricuspid stenosis. Aortic Valve: The aortic valve is tricuspid. Aortic valve regurgitation is trivial. No aortic stenosis is present. Aortic valve peak gradient measures 6.0 mmHg. Pulmonic Valve: The pulmonic valve was normal in structure. Pulmonic valve regurgitation is trivial. No evidence of pulmonic stenosis. Aorta: The aortic root is normal in size and structure. Venous: The inferior vena cava is normal in size with greater than 50% respiratory variability, suggesting right atrial pressure of 3 mmHg. IAS/Shunts: There is redundancy of the interatrial septum. No atrial level shunt detected by color flow Doppler.  LEFT VENTRICLE PLAX 2D LVIDd:         4.50 cm   Diastology LVIDs:         3.10 cm   LV e' medial:    11.20 cm/s LV PW:         0.60 cm   LV E/e' medial:  6.9 LV IVS:        0.90 cm   LV e' lateral:   13.80 cm/s LVOT diam:     2.20 cm   LV E/e' lateral: 5.6 LV SV:         63 LV SV Index:   31 LVOT Area:     3.80 cm  RIGHT VENTRICLE  IVC RV S prime:     13.40 cm/s  IVC diam: 1.55 cm TAPSE (M-mode): 1.7 cm LEFT ATRIUM             Index        RIGHT ATRIUM           Index LA diam:        4.10 cm 2.03 cm/m   RA Area:     12.70 cm LA Vol (A2C):   43.1 ml 21.37 ml/m  RA Volume:   24.50 ml  12.15 ml/m LA Vol (A4C):   57.6 ml 28.56 ml/m LA Biplane Vol: 49.8 ml 24.69 ml/m  AORTIC VALVE AV Area (Vmax): 2.82 cm AV Vmax:        122.00 cm/s AV Peak Grad:   6.0 mmHg LVOT Vmax:      90.64 cm/s LVOT Vmean:     57.140 cm/s LVOT VTI:       0.166 m  AORTA Ao Root diam: 3.20 cm Ao Asc diam:  3.10 cm MITRAL VALVE               TRICUSPID VALVE MV Area (PHT): 3.99 cm    TR Peak grad:   22.7 mmHg MV Decel Time: 190 msec    TR Vmax:        238.00 cm/s MV E velocity: 77.10 cm/s MV A velocity: 83.20 cm/s  SHUNTS MV E/A ratio:  0.93        Systemic VTI:  0.17 m                            Systemic Diam: 2.20 cm Weston Brass MD Electronically signed by Weston Brass MD Signature Date/Time: 02/10/2023/1:28:02 PM    Final    DG Chest Port 1 View  Result Date: 02/09/2023 CLINICAL DATA:  Possible sepsis, shortness of breath. EXAM: PORTABLE CHEST 1 VIEW COMPARISON:  05/25/2019. FINDINGS: The heart size and mediastinal contours are within normal limits. There is atherosclerotic calcification of the aorta. Patchy airspace disease is present at the left lung base. No effusion or pneumothorax. No acute osseous abnormality. IMPRESSION: Patchy airspace disease at the left lung base, suspicious for pneumonia. Electronically Signed   By: Thornell Sartorius M.D.   On: 02/09/2023 22:00       The results of significant diagnostics from this hospitalization (including imaging, microbiology, ancillary and laboratory) are listed below for reference.     Microbiology: Recent Results (from the past 240 hour(s))  Resp panel by RT-PCR (RSV, Flu A&B, Covid)     Status: None   Collection Time: 02/09/23  8:45 PM   Specimen: Nasal Swab  Result Value Ref Range Status    SARS Coronavirus 2 by RT PCR NEGATIVE NEGATIVE Final    Comment: (NOTE) SARS-CoV-2 target nucleic acids are NOT DETECTED.  The SARS-CoV-2 RNA is generally detectable in upper respiratory specimens during the acute phase of infection. The lowest concentration of SARS-CoV-2 viral copies this assay can detect is 138 copies/mL. A negative result does not preclude SARS-Cov-2 infection and should not be used as the sole basis for treatment or other patient management decisions. A negative result may occur with  improper specimen collection/handling, submission of specimen other than nasopharyngeal swab, presence of viral mutation(s) within the areas targeted by this assay, and inadequate number of viral copies(<138 copies/mL). A negative result must be combined with clinical observations, patient history, and epidemiological information. The expected result is  Negative.  Fact Sheet for Patients:  BloggerCourse.com  Fact Sheet for Healthcare Providers:  SeriousBroker.it  This test is no t yet approved or cleared by the [place] FDA and  has been authorized for detection and/or diagnosis of SARS-CoV-2 by FDA under an Emergency Use Authorization (EUA). This EUA will remain  in effect (meaning this test can be used) for the duration of the COVID-19 declaration under Section 564(b)(1) of the Act, 21 U.S.C.section 360bbb-3(b)(1), unless the authorization is terminated  or revoked sooner.       Influenza A by PCR NEGATIVE NEGATIVE Final   Influenza B by PCR NEGATIVE NEGATIVE Final    Comment: (NOTE) The Xpert Xpress SARS-CoV-2/FLU/RSV plus assay is intended as an aid in the diagnosis of influenza from Nasopharyngeal swab specimens and should not be used as a sole basis for treatment. Nasal washings and aspirates are unacceptable for Xpert Xpress SARS-CoV-2/FLU/RSV testing.  Fact Sheet for  Patients: BloggerCourse.com  Fact Sheet for Healthcare Providers: SeriousBroker.it  This test is not yet approved or cleared by the [place] FDA and has been authorized for detection and/or diagnosis of SARS-CoV-2 by FDA under an Emergency Use Authorization (EUA). This EUA will remain in effect (meaning this test can be used) for the duration of the COVID-19 declaration under Section 564(b)(1) of the Act, 21 U.S.C. section 360bbb-3(b)(1), unless the authorization is terminated or revoked.     Resp Syncytial Virus by PCR NEGATIVE NEGATIVE Final    Comment: (NOTE) Fact Sheet for Patients: BloggerCourse.com  Fact Sheet for Healthcare Providers: SeriousBroker.it  This test is not yet approved or cleared by the [place] FDA and has been authorized for detection and/or diagnosis of SARS-CoV-2 by FDA under an Emergency Use Authorization (EUA). This EUA will remain in effect (meaning this test can be used) for the duration of the COVID-19 declaration under Section 564(b)(1) of the Act, 21 U.S.C. section 360bbb-3(b)(1), unless the authorization is terminated or revoked.  Performed at [facility], [address] [place], [place] [address]   Blood Culture (routine x 2)     Status: None (Preliminary result)   Collection Time: 02/09/23  8:45 PM   Specimen: BLOOD  Result Value Ref Range Status   Specimen Description   Final    BLOOD LEFT ANTECUBITAL Performed at [facility], [address] [place], [place] [address]    Special Requests   Final    BOTTLES DRAWN AEROBIC AND ANAEROBIC Blood Culture adequate volume Performed at [facility], [address] [place], [place] [address]    Culture   Final    NO GROWTH 3 DAYS Performed at [facility], [address], [place], [place] [address]    Report Status PENDING  Incomplete  Blood  Culture (routine x 2)     Status: None (Preliminary result)   Collection Time: 02/09/23  8:50 PM   Specimen: BLOOD RIGHT FOREARM  Result Value Ref Range Status   Specimen Description   Final    BLOOD RIGHT FOREARM Performed at [facility], [address] [place], [place] [address]    Special Requests   Final    BOTTLES DRAWN AEROBIC AND ANAEROBIC Blood Culture results may not be optimal due to an excessive volume of blood received in culture bottles Performed at [facility], [address] [place], [place] [address]    Culture   Final    NO  GROWTH 3 DAYS Performed at [facility], [address], [place], [place] [address]    Report Status PENDING  Incomplete  Expectorated Sputum Assessment w Gram Stain, Rflx to Resp Cult     Status: None   Collection Time: 02/10/23 12:34 AM   Specimen: Sputum  Result Value Ref Range Status   Specimen Description SPUTUM  Final   Special Requests Normal  Final   Sputum evaluation   Final    Sputum specimen not acceptable for testing.  Please recollect.   notified RN EMILY HUDSON ON 02/10/23 @ 1551 BY DRT Performed at [facility], [address], [place], [place] [address]    Report Status 02/10/2023 FINAL  Final  Culture, blood (Routine X 2) w Reflex to ID Panel     Status: None (Preliminary result)   Collection Time: 02/10/23 12:59 AM   Specimen: BLOOD RIGHT HAND  Result Value Ref Range Status   Specimen Description BLOOD RIGHT HAND  Final   Special Requests   Final    BOTTLES DRAWN AEROBIC AND ANAEROBIC Blood Culture adequate volume   Culture   Final    NO GROWTH 2 DAYS Performed at [facility], [address], [place], [place] [address]    Report Status PENDING  Incomplete  Culture, blood (Routine X 2) w Reflex to ID Panel     Status: None (Preliminary result)   Collection Time: 02/10/23  1:00 AM   Specimen: BLOOD RIGHT HAND  Result Value Ref Range Status   Specimen Description BLOOD RIGHT HAND   Final   Special Requests   Final    BOTTLES DRAWN AEROBIC AND ANAEROBIC Blood Culture adequate volume   Culture   Final    NO GROWTH 2 DAYS Performed at [facility], [address] [place], [place] [address]    Report Status PENDING  Incomplete  MRSA Next Gen by PCR, Nasal     Status: None   Collection Time: 02/10/23  1:25 AM  Result Value Ref Range Status   MRSA by PCR Next Gen NOT DETECTED NOT DETECTED Final    Comment: (NOTE) The GeneXpert MRSA Assay (FDA approved for NASAL specimens only), is one component of a comprehensive MRSA colonization surveillance program. It is not intended to diagnose MRSA infection nor to guide or monitor treatment for MRSA infections. Test performance is not FDA approved in patients less than 31 years old. Performed at [facility], [address], [place], [place] [address]   Respiratory (~20 pathogens) panel by PCR     Status: Abnormal   Collection Time: 02/10/23  9:20 AM   Specimen: Nasopharyngeal Swab; Respiratory  Result Value Ref Range Status   Adenovirus NOT DETECTED NOT DETECTED Final   Coronavirus 229E NOT DETECTED NOT DETECTED Final    Comment: (NOTE) The Coronavirus on the Respiratory Panel, DOES NOT test for the novel  Coronavirus (2019 nCoV)    Coronavirus HKU1 NOT DETECTED NOT DETECTED Final   Coronavirus NL63 NOT DETECTED NOT DETECTED Final   Coronavirus OC43 NOT DETECTED NOT DETECTED Final   Metapneumovirus NOT DETECTED NOT DETECTED Final   Rhinovirus / Enterovirus DETECTED (A) NOT DETECTED Final   Influenza A NOT DETECTED NOT DETECTED Final   Influenza B NOT DETECTED NOT DETECTED Final   Parainfluenza Virus 1 NOT DETECTED NOT DETECTED Final   Parainfluenza Virus 2 NOT DETECTED NOT DETECTED Final   Parainfluenza Virus 3 NOT DETECTED NOT DETECTED Final  Parainfluenza Virus 4 NOT DETECTED NOT DETECTED Final   Respiratory Syncytial Virus NOT DETECTED NOT DETECTED Final   Bordetella pertussis NOT DETECTED NOT  DETECTED Final   Bordetella Parapertussis NOT DETECTED NOT DETECTED Final   Chlamydophila pneumoniae NOT DETECTED NOT DETECTED Final   Mycoplasma pneumoniae NOT DETECTED NOT DETECTED Final    Comment: Performed at [facility], [address] [place], [place] [address]  Expectorated Sputum Assessment w Gram Stain, Rflx to Resp Cult     Status: None   Collection Time: 02/11/23  5:48 AM   Specimen: Expectorated Sputum  Result Value Ref Range Status   Specimen Description EXPECTORATED SPUTUM  Final   Special Requests NONE  Final   Sputum evaluation   Final    THIS SPECIMEN IS ACCEPTABLE FOR SPUTUM CULTURE Performed at [facility], [address], [place], [place] [address]    Report Status 02/11/2023 FINAL  Final  Culture, Respiratory w Gram Stain     Status: None (Preliminary result)   Collection Time: 02/11/23  5:48 AM  Result Value Ref Range Status   Specimen Description EXPECTORATED SPUTUM  Final   Special Requests NONE Reflexed from [id number]  Final   Gram Stain   Final    FEW SQUAMOUS EPITHELIAL CELLS PRESENT MODERATE WBC PRESENT,BOTH PMN AND MONONUCLEAR FEW GRAM POSITIVE RODS    Culture   Final    CULTURE REINCUBATED FOR BETTER GROWTH Performed at [facility], [address], [place], [place] [address]    Report Status PENDING  Incomplete     Labs:  CBC: Recent Labs  Lab 02/09/23 2045 02/09/23 2105 02/10/23 0236 02/11/23 0235 02/12/23 0302  WBC 8.3  --  4.6 5.6 6.0  NEUTROABS 6.3  --   --   --   --   HGB 15.0 16.0 12.4* 12.1* 11.6*  HCT 44.6 47.0 37.2* 36.2* 35.0*  MCV 84.5  --  84.0 86.0 84.3  PLT 122*  --  93* 90* 102*   BMP &GFR Recent Labs  Lab 02/09/23 2045 02/09/23 2105 02/10/23 0236 02/11/23 0235 02/11/23 0236 02/12/23 0302  NA 133* 133* 132* 134* 134* 133*  K 3.5 3.5 3.4* 4.0 4.0 3.8  CL 100  --  100 102 102 103  CO2 20*  --  22 23 23 22   GLUCOSE 200*  --  241* 121* 120* 121*  BUN 19  --  15 13 12 11   CREATININE 1.32*  --   1.40* 1.30* 1.21 1.10  CALCIUM 9.1  --  8.1* 7.9* 7.9* 8.0*  MG  --   --  1.3* 1.8  --  1.9  PHOS  --   --   --   --  <1.0* 1.6*   Estimated Creatinine Clearance: 58 mL/min (by C-G formula based on SCr of 1.1 mg/dL). Liver & Pancreas: Recent Labs  Lab 02/09/23 2045 02/10/23 0236 02/11/23 0235 02/11/23 0236 02/12/23 0302  AST 29 27 38  --  35  ALT 18 16 23   --  21  ALKPHOS 60 40 38  --  47  BILITOT 1.3* 1.0 0.6  --  0.8  PROT 7.7 5.8* 5.6*  --  5.3*  ALBUMIN 4.2 2.9* 2.7* 2.6* 2.4*   No results for input(s): "LIPASE", "AMYLASE" in the last 168 hours. No results for input(s): "AMMONIA" in the last 168 hours. Diabetic: Recent Labs    02/10/23 1455  HGBA1C 6.3*   Recent Labs  Lab 02/11/23 0534  02/11/23 1125 02/11/23 1632 02/11/23 2311 02/12/23 0606  GLUCAP 106* 140* 178* 139* 111*   Cardiac Enzymes: No results for input(s): "CKTOTAL", "CKMB", "CKMBINDEX", "TROPONINI" in the last 168 hours. No results for input(s): "PROBNP" in the last 8760 hours. Coagulation Profile: Recent Labs  Lab 02/09/23 2045  INR 1.2   Thyroid Function Tests: No results for input(s): "TSH", "T4TOTAL", "FREET4", "T3FREE", "THYROIDAB" in the last 72 hours. Lipid Profile: No results for input(s): "CHOL", "HDL", "LDLCALC", "TRIG", "CHOLHDL", "LDLDIRECT" in the last 72 hours. Anemia Panel: No results for input(s): "VITAMINB12", "FOLATE", "FERRITIN", "TIBC", "IRON", "RETICCTPCT" in the last 72 hours. Urine analysis:    Component Value Date/Time   COLORURINE YELLOW 02/10/2023 0051   APPEARANCEUR CLEAR 02/10/2023 0051   LABSPEC 1.017 02/10/2023 0051   PHURINE 5.0 02/10/2023 0051   GLUCOSEU 50 (A) 02/10/2023 0051   HGBUR MODERATE (A) 02/10/2023 0051   BILIRUBINUR NEGATIVE 02/10/2023 0051   KETONESUR 5 (A) 02/10/2023 0051   PROTEINUR NEGATIVE 02/10/2023 0051   NITRITE NEGATIVE 02/10/2023 0051   LEUKOCYTESUR NEGATIVE 02/10/2023 0051   Sepsis Labs: Invalid input(s): "PROCALCITONIN",  "LACTICIDVEN"   SIGNED:  Almon Hercules, MD  [facility] 02/12/2023, 3:21 PM

## 2023-02-13 LAB — CULTURE, RESPIRATORY W GRAM STAIN: Culture: NORMAL

## 2023-02-13 LAB — LEGIONELLA PNEUMOPHILA SEROGP 1 UR AG: L. pneumophila Serogp 1 Ur Ag: NEGATIVE

## 2023-02-14 LAB — CULTURE, BLOOD (ROUTINE X 2)
Culture: NO GROWTH
Culture: NO GROWTH
Special Requests: ADEQUATE

## 2023-02-15 LAB — CULTURE, BLOOD (ROUTINE X 2)
Culture: NO GROWTH
Culture: NO GROWTH
Special Requests: ADEQUATE
Special Requests: ADEQUATE

## 2023-03-02 DIAGNOSIS — I10 Essential (primary) hypertension: Secondary | ICD-10-CM | POA: Diagnosis not present

## 2023-03-02 DIAGNOSIS — E1122 Type 2 diabetes mellitus with diabetic chronic kidney disease: Secondary | ICD-10-CM | POA: Diagnosis not present

## 2023-03-02 DIAGNOSIS — K219 Gastro-esophageal reflux disease without esophagitis: Secondary | ICD-10-CM | POA: Diagnosis not present

## 2023-03-02 DIAGNOSIS — N1831 Chronic kidney disease, stage 3a: Secondary | ICD-10-CM | POA: Diagnosis not present

## 2023-03-02 DIAGNOSIS — F5221 Male erectile disorder: Secondary | ICD-10-CM | POA: Diagnosis not present

## 2023-03-02 DIAGNOSIS — E039 Hypothyroidism, unspecified: Secondary | ICD-10-CM | POA: Diagnosis not present

## 2023-03-02 DIAGNOSIS — D696 Thrombocytopenia, unspecified: Secondary | ICD-10-CM | POA: Diagnosis not present

## 2023-04-24 DIAGNOSIS — N401 Enlarged prostate with lower urinary tract symptoms: Secondary | ICD-10-CM | POA: Diagnosis not present

## 2023-04-24 DIAGNOSIS — E1122 Type 2 diabetes mellitus with diabetic chronic kidney disease: Secondary | ICD-10-CM | POA: Diagnosis not present

## 2023-04-24 DIAGNOSIS — M25521 Pain in right elbow: Secondary | ICD-10-CM | POA: Diagnosis not present

## 2023-04-24 DIAGNOSIS — I10 Essential (primary) hypertension: Secondary | ICD-10-CM | POA: Diagnosis not present

## 2023-04-24 DIAGNOSIS — D696 Thrombocytopenia, unspecified: Secondary | ICD-10-CM | POA: Diagnosis not present

## 2023-04-24 DIAGNOSIS — N138 Other obstructive and reflux uropathy: Secondary | ICD-10-CM | POA: Diagnosis not present

## 2023-04-24 DIAGNOSIS — E039 Hypothyroidism, unspecified: Secondary | ICD-10-CM | POA: Diagnosis not present

## 2023-04-24 DIAGNOSIS — N1831 Chronic kidney disease, stage 3a: Secondary | ICD-10-CM | POA: Diagnosis not present

## 2023-07-31 DIAGNOSIS — N401 Enlarged prostate with lower urinary tract symptoms: Secondary | ICD-10-CM | POA: Diagnosis not present

## 2023-07-31 DIAGNOSIS — E039 Hypothyroidism, unspecified: Secondary | ICD-10-CM | POA: Diagnosis not present

## 2023-07-31 DIAGNOSIS — Z0001 Encounter for general adult medical examination with abnormal findings: Secondary | ICD-10-CM | POA: Diagnosis not present

## 2023-07-31 DIAGNOSIS — D696 Thrombocytopenia, unspecified: Secondary | ICD-10-CM | POA: Diagnosis not present

## 2023-07-31 DIAGNOSIS — N138 Other obstructive and reflux uropathy: Secondary | ICD-10-CM | POA: Diagnosis not present

## 2023-07-31 DIAGNOSIS — E1122 Type 2 diabetes mellitus with diabetic chronic kidney disease: Secondary | ICD-10-CM | POA: Diagnosis not present

## 2023-07-31 DIAGNOSIS — I10 Essential (primary) hypertension: Secondary | ICD-10-CM | POA: Diagnosis not present

## 2023-07-31 DIAGNOSIS — N1831 Chronic kidney disease, stage 3a: Secondary | ICD-10-CM | POA: Diagnosis not present

## 2023-12-14 DIAGNOSIS — I10 Essential (primary) hypertension: Secondary | ICD-10-CM | POA: Diagnosis not present

## 2023-12-14 DIAGNOSIS — Z0001 Encounter for general adult medical examination with abnormal findings: Secondary | ICD-10-CM | POA: Diagnosis not present

## 2023-12-14 DIAGNOSIS — N1831 Chronic kidney disease, stage 3a: Secondary | ICD-10-CM | POA: Diagnosis not present

## 2023-12-14 DIAGNOSIS — Z125 Encounter for screening for malignant neoplasm of prostate: Secondary | ICD-10-CM | POA: Diagnosis not present

## 2023-12-14 DIAGNOSIS — N138 Other obstructive and reflux uropathy: Secondary | ICD-10-CM | POA: Diagnosis not present

## 2023-12-14 DIAGNOSIS — D696 Thrombocytopenia, unspecified: Secondary | ICD-10-CM | POA: Diagnosis not present

## 2023-12-14 DIAGNOSIS — E039 Hypothyroidism, unspecified: Secondary | ICD-10-CM | POA: Diagnosis not present

## 2023-12-14 DIAGNOSIS — N401 Enlarged prostate with lower urinary tract symptoms: Secondary | ICD-10-CM | POA: Diagnosis not present

## 2023-12-14 DIAGNOSIS — E1122 Type 2 diabetes mellitus with diabetic chronic kidney disease: Secondary | ICD-10-CM | POA: Diagnosis not present

## 2024-01-11 DIAGNOSIS — E1122 Type 2 diabetes mellitus with diabetic chronic kidney disease: Secondary | ICD-10-CM | POA: Diagnosis not present

## 2024-01-11 DIAGNOSIS — N1831 Chronic kidney disease, stage 3a: Secondary | ICD-10-CM | POA: Diagnosis not present

## 2024-01-11 DIAGNOSIS — D696 Thrombocytopenia, unspecified: Secondary | ICD-10-CM | POA: Diagnosis not present

## 2024-01-11 DIAGNOSIS — N401 Enlarged prostate with lower urinary tract symptoms: Secondary | ICD-10-CM | POA: Diagnosis not present

## 2024-01-11 DIAGNOSIS — N138 Other obstructive and reflux uropathy: Secondary | ICD-10-CM | POA: Diagnosis not present

## 2024-01-11 DIAGNOSIS — I10 Essential (primary) hypertension: Secondary | ICD-10-CM | POA: Diagnosis not present

## 2024-01-11 DIAGNOSIS — E039 Hypothyroidism, unspecified: Secondary | ICD-10-CM | POA: Diagnosis not present

## 2024-04-01 DIAGNOSIS — N138 Other obstructive and reflux uropathy: Secondary | ICD-10-CM | POA: Diagnosis not present

## 2024-04-01 DIAGNOSIS — N401 Enlarged prostate with lower urinary tract symptoms: Secondary | ICD-10-CM | POA: Diagnosis not present

## 2024-04-01 DIAGNOSIS — K219 Gastro-esophageal reflux disease without esophagitis: Secondary | ICD-10-CM | POA: Diagnosis not present

## 2024-04-01 DIAGNOSIS — E039 Hypothyroidism, unspecified: Secondary | ICD-10-CM | POA: Diagnosis not present

## 2024-04-01 DIAGNOSIS — D696 Thrombocytopenia, unspecified: Secondary | ICD-10-CM | POA: Diagnosis not present

## 2024-04-01 DIAGNOSIS — I129 Hypertensive chronic kidney disease with stage 1 through stage 4 chronic kidney disease, or unspecified chronic kidney disease: Secondary | ICD-10-CM | POA: Diagnosis not present

## 2024-04-01 DIAGNOSIS — E1122 Type 2 diabetes mellitus with diabetic chronic kidney disease: Secondary | ICD-10-CM | POA: Diagnosis not present

## 2024-04-01 DIAGNOSIS — N1831 Chronic kidney disease, stage 3a: Secondary | ICD-10-CM | POA: Diagnosis not present

## 2024-04-01 DIAGNOSIS — I10 Essential (primary) hypertension: Secondary | ICD-10-CM | POA: Diagnosis not present

## 2024-04-20 DIAGNOSIS — E119 Type 2 diabetes mellitus without complications: Secondary | ICD-10-CM | POA: Diagnosis not present

## 2024-04-20 DIAGNOSIS — H2513 Age-related nuclear cataract, bilateral: Secondary | ICD-10-CM | POA: Diagnosis not present

## 2024-06-12 NOTE — Progress Notes (Signed)
 Blake Herman                                          MRN: [id number]   06/12/2024   The [facility] Quality Team Specialist reviewed this patient medical record for the purposes of chart review for care gap closure. The following were reviewed: chart review for care gap closure-controlling blood pressure.    [facility] Quality Team
